# Patient Record
Sex: Male | Born: 1945 | Race: White | Hispanic: No | Marital: Married
Health system: Southern US, Community
[De-identification: ages and names within clinical notes are randomized; demographics above are authoritative.]

## PROBLEM LIST (undated history)

## (undated) DIAGNOSIS — E7800 Pure hypercholesterolemia, unspecified: Secondary | ICD-10-CM

## (undated) DIAGNOSIS — J189 Pneumonia, unspecified organism: Secondary | ICD-10-CM

## (undated) DIAGNOSIS — F419 Anxiety disorder, unspecified: Secondary | ICD-10-CM

## (undated) DIAGNOSIS — M549 Dorsalgia, unspecified: Secondary | ICD-10-CM

## (undated) DIAGNOSIS — E785 Hyperlipidemia, unspecified: Secondary | ICD-10-CM

## (undated) DIAGNOSIS — E039 Hypothyroidism, unspecified: Secondary | ICD-10-CM

## (undated) DIAGNOSIS — E119 Type 2 diabetes mellitus without complications: Secondary | ICD-10-CM

## (undated) DIAGNOSIS — Z86010 Personal history of colonic polyps: Secondary | ICD-10-CM

## (undated) DIAGNOSIS — K5790 Diverticulosis of intestine, part unspecified, without perforation or abscess without bleeding: Secondary | ICD-10-CM

## (undated) DIAGNOSIS — R21 Rash and other nonspecific skin eruption: Secondary | ICD-10-CM

## (undated) DIAGNOSIS — Z8619 Personal history of other infectious and parasitic diseases: Secondary | ICD-10-CM

## (undated) DIAGNOSIS — I10 Essential (primary) hypertension: Secondary | ICD-10-CM

## (undated) DIAGNOSIS — G4700 Insomnia, unspecified: Secondary | ICD-10-CM

## (undated) DIAGNOSIS — C679 Malignant neoplasm of bladder, unspecified: Secondary | ICD-10-CM

## (undated) DIAGNOSIS — G4733 Obstructive sleep apnea (adult) (pediatric): Secondary | ICD-10-CM

## (undated) HISTORY — PX: MULTIPLE TOOTH EXTRACTIONS: SHX2053

## (undated) HISTORY — DX: Hypothyroidism, unspecified: E03.9

## (undated) HISTORY — DX: Essential (primary) hypertension: I10

## (undated) HISTORY — DX: Personal history of colonic polyps: Z86.010

## (undated) HISTORY — PX: CHOLECYSTECTOMY: SHX55

## (undated) HISTORY — PX: BACK SURGERY: SHX140

## (undated) HISTORY — DX: Diverticulosis of intestine, part unspecified, without perforation or abscess without bleeding: K57.90

## (undated) HISTORY — DX: Malignant neoplasm of bladder, unspecified: C67.9

## (undated) HISTORY — PX: FRACTURE SURGERY: SHX138

## (undated) HISTORY — DX: Obstructive sleep apnea (adult) (pediatric): G47.33

## (undated) HISTORY — DX: Insomnia, unspecified: G47.00

## (undated) HISTORY — DX: Anxiety disorder, unspecified: F41.9

## (undated) HISTORY — PX: BLADDER SURGERY: SHX569

## (undated) HISTORY — DX: Dorsalgia, unspecified: M54.9

## (undated) HISTORY — PX: EYE SURGERY: SHX253

## (undated) HISTORY — PX: APPENDECTOMY: SHX54

## (undated) HISTORY — DX: Hyperlipidemia, unspecified: E78.5

## (undated) HISTORY — PX: COLONOSCOPY: SHX174

---
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
## 1974-09-08 HISTORY — PX: TRACHEAL SURGERY: SHX1096

## 2000-05-12 ENCOUNTER — Ambulatory Visit (HOSPITAL_COMMUNITY): Admission: RE | Admit: 2000-05-12 | Discharge: 2000-05-12 | Payer: Self-pay | Admitting: Family Medicine

## 2000-05-12 ENCOUNTER — Encounter: Payer: Self-pay | Admitting: Family Medicine

## 2000-05-23 ENCOUNTER — Encounter: Payer: Self-pay | Admitting: Family Medicine

## 2000-05-23 ENCOUNTER — Ambulatory Visit (HOSPITAL_COMMUNITY): Admission: RE | Admit: 2000-05-23 | Discharge: 2000-05-23 | Payer: Self-pay | Admitting: Family Medicine

## 2000-06-03 ENCOUNTER — Encounter: Payer: Self-pay | Admitting: Neurology

## 2000-06-03 ENCOUNTER — Encounter: Admission: RE | Admit: 2000-06-03 | Discharge: 2000-06-03 | Payer: Self-pay | Admitting: Neurology

## 2000-06-24 ENCOUNTER — Ambulatory Visit (HOSPITAL_COMMUNITY): Admission: RE | Admit: 2000-06-24 | Discharge: 2000-06-24 | Payer: Self-pay | Admitting: Neurosurgery

## 2000-06-24 ENCOUNTER — Encounter: Payer: Self-pay | Admitting: Neurosurgery

## 2000-07-03 ENCOUNTER — Ambulatory Visit (HOSPITAL_BASED_OUTPATIENT_CLINIC_OR_DEPARTMENT_OTHER): Admission: RE | Admit: 2000-07-03 | Discharge: 2000-07-03 | Payer: Self-pay

## 2001-10-25 ENCOUNTER — Encounter: Payer: Self-pay | Admitting: Surgery

## 2001-10-28 ENCOUNTER — Ambulatory Visit (HOSPITAL_COMMUNITY): Admission: RE | Admit: 2001-10-28 | Discharge: 2001-10-28 | Payer: Self-pay | Admitting: Surgery

## 2003-11-17 ENCOUNTER — Encounter: Payer: Self-pay | Admitting: Internal Medicine

## 2004-10-17 ENCOUNTER — Ambulatory Visit (HOSPITAL_COMMUNITY): Admission: RE | Admit: 2004-10-17 | Discharge: 2004-10-17 | Payer: Self-pay | Admitting: Urology

## 2004-10-17 ENCOUNTER — Encounter: Payer: Self-pay | Admitting: Internal Medicine

## 2004-10-28 ENCOUNTER — Ambulatory Visit (HOSPITAL_COMMUNITY): Admission: RE | Admit: 2004-10-28 | Discharge: 2004-10-28 | Payer: Self-pay | Admitting: Family Medicine

## 2004-10-31 ENCOUNTER — Ambulatory Visit: Payer: Self-pay | Admitting: Internal Medicine

## 2004-11-01 ENCOUNTER — Ambulatory Visit: Payer: Self-pay

## 2004-11-01 ENCOUNTER — Encounter: Payer: Self-pay | Admitting: Internal Medicine

## 2005-03-06 ENCOUNTER — Ambulatory Visit (HOSPITAL_COMMUNITY): Admission: RE | Admit: 2005-03-06 | Discharge: 2005-03-06 | Payer: Self-pay | Admitting: Family Medicine

## 2005-09-29 ENCOUNTER — Ambulatory Visit: Payer: Self-pay | Admitting: Internal Medicine

## 2005-09-30 ENCOUNTER — Ambulatory Visit (HOSPITAL_COMMUNITY): Admission: RE | Admit: 2005-09-30 | Discharge: 2005-09-30 | Payer: Self-pay | Admitting: Internal Medicine

## 2005-10-16 ENCOUNTER — Ambulatory Visit (HOSPITAL_COMMUNITY): Admission: RE | Admit: 2005-10-16 | Discharge: 2005-10-17 | Payer: Self-pay | Admitting: Urology

## 2005-10-16 ENCOUNTER — Encounter (INDEPENDENT_AMBULATORY_CARE_PROVIDER_SITE_OTHER): Payer: Self-pay | Admitting: Specialist

## 2005-10-16 ENCOUNTER — Encounter: Payer: Self-pay | Admitting: Cardiothoracic Surgery

## 2005-11-04 ENCOUNTER — Ambulatory Visit (HOSPITAL_BASED_OUTPATIENT_CLINIC_OR_DEPARTMENT_OTHER): Admission: RE | Admit: 2005-11-04 | Discharge: 2005-11-04 | Payer: Self-pay | Admitting: Urology

## 2005-11-10 ENCOUNTER — Encounter: Payer: Self-pay | Admitting: Internal Medicine

## 2005-11-10 ENCOUNTER — Emergency Department (HOSPITAL_COMMUNITY): Admission: EM | Admit: 2005-11-10 | Discharge: 2005-11-10 | Payer: Self-pay | Admitting: Emergency Medicine

## 2005-11-28 ENCOUNTER — Ambulatory Visit: Payer: Self-pay | Admitting: Internal Medicine

## 2006-01-12 ENCOUNTER — Encounter: Admission: RE | Admit: 2006-01-12 | Discharge: 2006-01-15 | Payer: Self-pay | Admitting: Orthopedic Surgery

## 2007-01-26 ENCOUNTER — Ambulatory Visit (HOSPITAL_BASED_OUTPATIENT_CLINIC_OR_DEPARTMENT_OTHER): Admission: RE | Admit: 2007-01-26 | Discharge: 2007-01-26 | Payer: Self-pay | Admitting: Urology

## 2007-01-26 ENCOUNTER — Encounter: Payer: Self-pay | Admitting: Urology

## 2007-07-08 ENCOUNTER — Encounter: Admission: RE | Admit: 2007-07-08 | Discharge: 2007-07-08 | Payer: Self-pay | Admitting: Family Medicine

## 2008-09-08 DIAGNOSIS — C679 Malignant neoplasm of bladder, unspecified: Secondary | ICD-10-CM

## 2008-09-08 HISTORY — DX: Malignant neoplasm of bladder, unspecified: C67.9

## 2008-09-08 HISTORY — PX: OTHER SURGICAL HISTORY: SHX169

## 2009-06-06 ENCOUNTER — Ambulatory Visit (HOSPITAL_COMMUNITY): Admission: RE | Admit: 2009-06-06 | Discharge: 2009-06-06 | Payer: Self-pay | Admitting: Family Medicine

## 2009-07-06 ENCOUNTER — Encounter: Payer: Self-pay | Admitting: Internal Medicine

## 2009-10-10 ENCOUNTER — Encounter (INDEPENDENT_AMBULATORY_CARE_PROVIDER_SITE_OTHER): Payer: Self-pay | Admitting: *Deleted

## 2009-10-11 ENCOUNTER — Ambulatory Visit: Payer: Self-pay | Admitting: Internal Medicine

## 2009-10-13 LAB — HM COLONOSCOPY

## 2009-10-25 ENCOUNTER — Ambulatory Visit: Payer: Self-pay | Admitting: Internal Medicine

## 2009-10-27 ENCOUNTER — Encounter: Payer: Self-pay | Admitting: Internal Medicine

## 2009-10-29 ENCOUNTER — Telehealth: Payer: Self-pay | Admitting: Internal Medicine

## 2009-11-06 ENCOUNTER — Ambulatory Visit: Payer: Self-pay | Admitting: Internal Medicine

## 2009-11-06 DIAGNOSIS — E669 Obesity, unspecified: Secondary | ICD-10-CM | POA: Insufficient documentation

## 2009-11-06 DIAGNOSIS — R109 Unspecified abdominal pain: Secondary | ICD-10-CM | POA: Insufficient documentation

## 2009-11-10 ENCOUNTER — Encounter: Payer: Self-pay | Admitting: Internal Medicine

## 2009-11-10 DIAGNOSIS — IMO0002 Reserved for concepts with insufficient information to code with codable children: Secondary | ICD-10-CM | POA: Insufficient documentation

## 2009-11-10 DIAGNOSIS — K5730 Diverticulosis of large intestine without perforation or abscess without bleeding: Secondary | ICD-10-CM | POA: Insufficient documentation

## 2009-11-10 DIAGNOSIS — Z860100 Personal history of colon polyps, unspecified: Secondary | ICD-10-CM | POA: Insufficient documentation

## 2009-11-10 HISTORY — DX: Personal history of colonic polyps: Z86.010

## 2009-11-12 ENCOUNTER — Telehealth: Payer: Self-pay | Admitting: Internal Medicine

## 2009-12-27 ENCOUNTER — Ambulatory Visit (HOSPITAL_COMMUNITY): Admission: RE | Admit: 2009-12-27 | Discharge: 2009-12-29 | Payer: Self-pay | Admitting: Specialist

## 2010-09-29 ENCOUNTER — Encounter: Payer: Self-pay | Admitting: Internal Medicine

## 2010-09-29 ENCOUNTER — Encounter: Payer: Self-pay | Admitting: Family Medicine

## 2010-10-08 NOTE — Assessment & Plan Note (Signed)
Summary: Persistent LLQ pain  Dan [name] MR#:  [id number] Page #  NAME:  Dan, [name]  OFFICE NO:  [id number]  DATE:  11/28/05  DOB:  [birth year]  CHIEF COMPLAINT:  Persistent, left-sided, lower quadrant pain.    HISTORY OF PRESENT ILLNESS:  The patient had a bladder cancer discovered at a CT scan I ordered.  He went to Dr. Annabell Howells, and that has been removed.  The ureter was involved as well, at least it was thought, perhaps the kidney.  He tells me that he has been back to Dr. Annabell Howells, and it was thought that the tumor was cleared at this point, though some followup will be needed.  Having the tumor removed did not help his pain.  He continues with a pain that is worse in the left groin area and left lower quadrant when he is sitting.  When he was off of work for a while, it did not seem to bother him.  He drives in a car at work, and sitting seems to bother it.  Standing will bother it as well.  There is some back pain, but the predominant feature is left lower quadrant and groin pain as described in my note of September 29, 2005.  He is not having any urinary problems.  He says that Ambien relieves it at night.  Vicodin is not really helping.  He is not having any lower extremity weakness.  There is no fecal or urinary incontinence.    He does describe some numbness and tingling in the left lateral aspect of the knee at times.    MEDICATIONS:  Listed and reviewed in the chart.    PHYSICAL EXAMINATION:  Weight 217, blood pressure 122/74, pulse 72.  Abdomen:  There is some mild left lower quadrant tenderness.  It seems to be worse with muscle tension.  There is some left flank pain.  He has some pain with right straight leg raise in that he has left flank pain when the right lower extremity is raised.  Deep tendon reflexes are normal at the knees.  There is no clonus of the ankles.  Lower extremity strength is normal.    ASSESSMENT:  Left lower quadrant pain, etiology not really clear to me.  He previously  had a colonoscopy 2 years ago with some polyps.  We have not yet received those records.  We will try that again, also asking his primary care provider for the records as well.  Perhaps they have them.  We had requested them from Dr. Tania Ade in [place].  I doubt that this is a colonic process.  There is no relationship to bowel habits or anything like that.  He has not had any bleeding.  It has more of a musculoskeletal or perhaps radicular flavor, I think.    PLAN:   1.  Naprosyn 500 mg twice daily on a regular basis for about a month or so.   2.  Percocet 5/325 to help with pain at night if needed, #30, no refills.   3.  He was warned about the side effects of ulcer disease and dyspepsia with Naprosyn and is to call if that is a problem.   4.  I will see him back in 4-6 weeks.  He may need to go back to primary care to get an orthopedic referral versus an MRI of the spine or something like that.  Further plans pending response or lack thereof to the above.  Iva Boop, M.D., F.A.C.G.  [id number] cc:  Montey Hora, M.D.         Bjorn Pippin, M.D.  D:  11/28/05; T:  ; Job [phone]

## 2010-10-08 NOTE — Procedures (Signed)
Summary: Colonoscopy  Patient: Dan [name] Note: All result statuses are Final unless otherwise noted.  Tests: (1) Colonoscopy (COL)   COL Colonoscopy           DONE     [facility]     [address]     [place], [place]  [address]           COLONOSCOPY PROCEDURE REPORT           PATIENT:  Dan, [name]  MR#:  [id number]     BIRTHDATE:  February 23, 1946, 63 yrs. old  GENDER:  male           ENDOSCOPIST:  Iva Boop, MD, [facility]     Referred by:  Kyra Manges, M.D.           PROCEDURE DATE:  10/25/2009     PROCEDURE:  Colonoscopy with biopsy and snare polypectomy     ASA CLASS:  Class II     INDICATIONS:  diverticulitis recurrent LLQ pain thought tobe     diverticulitis     CT w/o IV contrast abd/pelvis 9/10 shows diverticulosis but not     divertculitis           MEDICATIONS:   Fentanyl 50 mcg IV, Versed 7 mg IV           DESCRIPTION OF PROCEDURE:   After the risks benefits and     alternatives of the procedure were thoroughly explained, informed     consent was obtained.  Digital rectal exam was performed and     revealed no abnormalities.  Smooth prostate without nodules. The     LB CF-[id number] endoscope was introduced through the anus and     advanced to the cecum, which was identified by both the appendix     and ileocecal valve, without limitations.  The quality of the prep     was excellent, using MoviPrep.  The instrument was then slowly     withdrawn as the colon was fully examined.     Insertion: 3:20 minutes Withdrawal: 14:52 minutes     <<PROCEDUREIMAGES>>           FINDINGS:  Severe diverticulosis was found in the left colon.  Two     polyps were found. 3 mm ascending colon polyp and 5 mm rectal     polyp. The 3mm ascending polyp was removed using cold biopsy     forceps. 5 mm rectal polyp was snared without cautery. Retrieval     was successful.   This was otherwise a normal examination of the     colon.   Retroflexed views in the rectum revealed  no     abnormalities.    The scope was then withdrawn from the patient     and the procedure completed.           COMPLICATIONS:  None           ENDOSCOPIC IMPRESSION:     1) Severe diverticulosis in the left colon     2) Two polyps removed (3 mm ascending and 5 mm rectal)     3) Otherwise normal examination     RECOMMENDATIONS:     Return to primary care physician     High fiber diet and would try anti-spasmodic agents.     I can see him in the office re: recurrent LLQ pain if desired           REPEAT EXAM:  In for Colonoscopy, pending biopsy results.           Iva Boop, MD, [facility]           CC:  Kyra Manges, MD     The Patient           n.     Rosalie Doctor:   Iva Boop at 10/25/2009 09:52 AM           Lucretia Kern, [id number]  Note: An exclamation mark (!) indicates a result that was not dispersed into the flowsheet. Document Creation Date: 10/25/2009 9:52 AM _______________________________________________________________________  (1) Order result status: Final Collection or observation date-time: 10/25/2009 09:38 Requested date-time:  Receipt date-time:  Reported date-time:  Referring Physician:   Ordering Physician: Stan Head [phone]) Specimen Source:  Source: Launa Grill Order Number: [id number] Lab site:   Appended Document: Colonoscopy     Procedures Next Due Date:    Colonoscopy: 11/2012

## 2010-10-08 NOTE — Procedures (Signed)
Summary: Colonoscopy/[facility]  Colonoscopy/[facility]   Imported By: Sherian Rein 11/12/2009 11:22:11  _____________________________________________________________________  External Attachment:    Type:   Image     Comment:   External Document

## 2010-10-08 NOTE — Miscellaneous (Signed)
Summary: Moviprep rx  Clinical Lists Changes  Medications: Added new medication of MOVIPREP 100 GM  SOLR (PEG-KCL-NACL-NASULF-NA ASC-C) As per prep instructions. - Signed Rx of MOVIPREP 100 GM  SOLR (PEG-KCL-NACL-NASULF-NA ASC-C) As per prep instructions.;  #1 x 0;  Signed;  Entered by: Jennye Boroughs RN;  Authorized by: Iva Boop MD, [facility];  Method used: [facility] [phone]*, [address], [place], [place], [place]  [address], Ph: [phone] or [phone], Fax: [phone]    Prescriptions: MOVIPREP 100 GM  SOLR (PEG-KCL-NACL-NASULF-NA ASC-C) As per prep instructions.  #1 x 0   Entered by:   Jennye Boroughs RN   Authorized by:   Iva Boop MD, [facility]   Signed by:   Jennye Boroughs RN on 10/11/2009   Method used:   Electronically to        [facility] [phone]* (retail)       [address]       [place], [place]  [address]       Ph: [phone] or [phone]       Fax: [phone]   RxID:   [id number]

## 2010-10-08 NOTE — Letter (Signed)
Summary: Patient Notice- Polyp Results  [facility]  [address] [place], [place] [address]   Phone: [phone]  Fax: [phone]        October 27, 2009 MRN: [id number]    [facility] [address] [place], [place]  [address]    Dear Dan [name],  POne of the polyps removed from your colon was adenomatous. This means that it was pre-cancerous or that  it had the potential to change into cancer over time. the other polyp was not pre-cancerous.  I recommend that you have a repeat colonoscopy in 3 years to determine if you have developed any new polyps over time. If you develop any new rectal bleeding, abdominal pain or significant bowel habit changes, please contact us before then.  In addition to repeating colonoscopy, changing health habits may reduce your risk of having more colon polyps and possibly, colon cancer. You may lower your risk of future polyps and colon cancer by adopting healthy habits such as not smoking or using tobacco (if you do), being physically active, losing weight (if overweight), and eating a diet which includes fruits and vegetables and limits red meat.  Please call us if you are having persistent problems or have questions about your condition that have not been fully answered at this time.   Sincerely,  Iva Boop MD, [facility]  This letter has been electronically signed by your physician.  Appended Document: Patient Notice- Polyp Results Letter mailed 2.22.11.

## 2010-10-08 NOTE — Consult Note (Signed)
Summary[facility]   Imported By: Sherian Rein 11/12/2009 11:29:39  _____________________________________________________________________  External Attachment:    Type:   Image     Comment:   External Document

## 2010-10-08 NOTE — Assessment & Plan Note (Signed)
Summary: llq absominal pain.Dan [name]   History of Present Illness Visit Type: Initial Visit Primary GI MD: Stan Head MD [facility] Primary Provider: Ignacia Bayley  Chief Complaint: LLQ abdominal Pain History of Present Illness:   65 yo white man with abdominal pain. Recently had an open access colonoscopy and described the LLQ pain and I recommended office visit. He had actually seen me for this in 2006-7, and a CT discovered transitional cell carcinoma of the bladder, which was resected,. the pain continued. He describes many years of LLQ pain that is worsening feels like a knot when sitting and lying also has sxs in right groin standing helps it some low back pain and stinging in right hip area some rare intermittent lower extremity pain no buttock pain   GI Review of Systems    Reports abdominal pain.     Location of  Abdominal pain: LLQ.    Denies acid reflux, belching, bloating, chest pain, dysphagia with liquids, dysphagia with solids, heartburn, loss of appetite, nausea, vomiting, vomiting blood, weight loss, and  weight gain.      Reports change in bowel habits and  diarrhea.     Denies anal fissure, black tarry stools, constipation, diverticulosis, fecal incontinence, heme positive stool, hemorrhoids, irritable bowel syndrome, jaundice, light color stool, liver problems, rectal bleeding, and  rectal pain. Preventive Screening-Counseling & Management  Alcohol-Tobacco     Smoking Status: quit      Drug Use:  no.      Current Medications (verified): 1)  Lipitor 20 Mg Tabs (Atorvastatin Calcium) .Marland Kitchen.. 1 Tablet By Mouth Once Daily 2)  Amlodipine Besylate 10 Mg Tabs (Amlodipine Besylate) .Marland Kitchen.. 1 Tablet By Mouth Once Daily 3)  Uroxatral 10 Mg Xr24h-Tab (Alfuzosin Hcl) .Marland Kitchen.. 1 Tablet By Mouth Once Daily 4)  Levothroid 200 Mcg Tabs (Levothyroxine Sodium) .Marland Kitchen.. 1 Tablet By Mouth Once Daily 5)  Diovan 160 Mg Tabs (Valsartan) .Marland Kitchen.. 1 Tablet By Mouth Once Daily 6)  Ambien 10 Mg Tabs  (Zolpidem Tartrate) .Marland Kitchen.. 1 By Mouth At Bedtime Sleep  Allergies (verified): 1)  ! * Ivp Dye  Past History:  Past Medical History: Anxiety Disorder Arthritis Adenomatous Colon Polyps Diverticulosis Hyperlipidemia Hypertension Hypothyroidism Irritable Bowel Syndrome Kidney Stones Bladder Cancer - Wrenn BPH  Past Surgical History: Appendectomy Cholecystectomy Hernia Surgery Facial Surgery  Family History: Brain cancer-uncle Lung Cancer-uncle Bladder cancer-uncle No FH of Colon Cancer: Family History of Diabetes: father  Social History: Married 2 girls Retired Patient is a former smoker.  Alcohol Use - yes Daily Caffeine Use Illicit Drug Use - no Smoking Status:  quit Drug Use:  no  Review of Systems       The patient complains of allergy/sinus, anxiety-new, back pain, headaches-new, shortness of breath, sleeping problems, and urination - excessive.         abd sxs keep him from sleeping All other ROS negative except as per HPI.   Vital Signs:  Patient profile:   65 year old male Height:      71 inches Weight:      242 pounds BMI:     33.87 Pulse rate:   96 / minute Pulse rhythm:   regular BP sitting:   120 / 70  (left arm)  Vitals Entered By: Milford Cage NCMA (November 06, 2009 2:49 PM)  Physical Exam  General:  obese, NAD Mouth:  No deformity or lesions, dentition normal. Lungs:  Clear throughout to auscultation. Heart:  Regular rate and rhythm; no murmurs, rubs,  or bruits. Abdomen:  obese, soft tender/sore LLQ area and some RLQ Extremities:  no edema Neurologic:  lower ext strength intact, able to squat and rise   Impression & Recommendations:  Problem # 1:  ABDOMINAL PAIN, LOWER (ICD-789.09) Chronic LLQ and groin pain Has had years of other somatic complaints also Etiology not clear but do not think it is gastrointestinal in origin ? radicular process fom LS spine. He says he has not had a spine evaluation and I think bladder cancer  issues took over after I saw him in 2007 Records review shows 2006 and 2007 L and Cspine MRs with degenerative disc disease but were not oredered by spine specialists Will rx Medrol dose pak and seek spine evaluation specialist  Problem # 2:  OBESITY (ICD-278.00) part of problem, weight loss discussed  Patient Instructions: 1)  You need to lose weight. Start by limiting portions, amounts. Avoid eating when not hungry. Limit desserts.Look for high fructose corn syrup on food labels and if in first 3 ingredients, avoid that food. Also try to eat whole grains, avoid "white foods" (e.g. white rice, white bread).   2)  Please pick up your medications at your pharmacy.Medrol dosepack and vicodin 3)  We will call with spine specialist appointment. 4)  The medication list was reviewed and reconciled.  All changed / newly prescribed medications were explained.  A complete medication list was provided to the patient / caregiver. Prescriptions: HYDROCODONE-ACETAMINOPHEN 5-500 MG TABS (HYDROCODONE-ACETAMINOPHEN) 1-2 every 4 hours as needed for severe pain  #20 x 0   Entered and Authorized by:   Iva Boop MD, [facility]   Signed by:   Iva Boop MD, [facility] on 11/06/2009   Method used:   Printed then [facility] ...       [facility]  [address] [phone]* (retail)       [address]       [place], [place]  [address]       Ph: [phone] or [phone]       Fax: [phone]   RxID:   [id number] MEDROL (PAK) 4 MG TABS (METHYLPREDNISOLONE) use as directed  #1 x 0   Entered by:   Francee Piccolo CMA (AAMA)   Authorized by:   Iva Boop MD, [facility]   Signed by:   Francee Piccolo CMA (AAMA) on 11/06/2009   Method used:   Electronically to        [facility] [phone]* (retail)       [address]       [place], [place]  [address]       Ph: [phone] or [phone]       Fax: [phone]   RxID:   [id number]  cc: Kyra Manges, MD  and Bjorn Pippin, MD  Appended Document: llq absominal pain.Dan [name]   Impression & Recommendations:  Problem # 1:  DEGENERATIVE DISC DISEASE (ICD-722.6) Assessment Comment Only He clearly has this on Cspine and LS Spine MR's will see if Medrol helps and get a spine eval   Appended Document: Orders Update Clinical Lists Changes  Orders: Added new Referral order of Neurology Referral (Neuro) - Signed      Appended Document: Orders Update Clinical Lists Changes  Orders: Added new Referral order of Orthopedic Referral (Ortho) - Signed

## 2010-10-08 NOTE — Progress Notes (Signed)
Summary: [facility]   Imported By: Sherian Rein 11/12/2009 11:28:16  _____________________________________________________________________  External Attachment:    Type:   Image     Comment:   External Document

## 2010-10-08 NOTE — Letter (Signed)
Summary: [facility] Instructions  [facility]  [address] [place], [place] [address]   Phone: [phone]  Fax: [phone]       Dan [name]    Aug 13, 1946    MRN: [id number]        Procedure Day Dan [name]:  Dan [name]  10/25/09     Arrival Time:  8:00AM     Procedure Time:  9:00AM     Location of Procedure:                    _ X_  [facility] (4th Floor)                        PREPARATION FOR COLONOSCOPY WITH MOVIPREP   Starting 5 days prior to your procedure 10/20/09 do not eat nuts, seeds, popcorn, corn, beans, peas,  salads, or any raw vegetables.  Do not take any fiber supplements (e.g. Metamucil, Citrucel, and Benefiber).  THE DAY BEFORE YOUR PROCEDURE         DATE: 10/24/09  DAY: WEDNESDAY  1.  Drink clear liquids the entire day-NO SOLID FOOD  2.  Do not drink anything colored red or purple.  Avoid juices with pulp.  No orange juice.  3.  Drink at least 64 oz. (8 glasses) of fluid/clear liquids during the day to prevent dehydration and help the prep work efficiently.  CLEAR LIQUIDS INCLUDE: Water Jello Ice Popsicles Tea (sugar ok, no milk/cream) Powdered fruit flavored drinks Coffee (sugar ok, no milk/cream) Gatorade Juice: apple, white grape, white cranberry  Lemonade Clear bullion, consomm, broth Carbonated beverages (any kind) Strained chicken noodle soup Hard Candy                             4.  In the morning, mix first dose of MoviPrep solution:    Empty 1 Pouch A and 1 Pouch B into the disposable container    Add lukewarm drinking water to the top line of the container. Mix to dissolve    Refrigerate (mixed solution should be used within 24 hrs)  5.  Begin drinking the prep at 5:00 p.m. The MoviPrep container is divided by 4 marks.   Every 15 minutes drink the solution down to the next mark (approximately 8 oz) until the full liter is complete.   6.  Follow completed prep with 16 oz of clear liquid of your choice  (Nothing red or purple).  Continue to drink clear liquids until bedtime.  7.  Before going to bed, mix second dose of MoviPrep solution:    Empty 1 Pouch A and 1 Pouch B into the disposable container    Add lukewarm drinking water to the top line of the container. Mix to dissolve    Refrigerate  THE DAY OF YOUR PROCEDURE      DATE: 10/25/09 DAY: THURSDAY  Beginning at 4:00AM (5 hours before procedure):         1. Every 15 minutes, drink the solution down to the next mark (approx 8 oz) until the full liter is complete.  2. Follow completed prep with 16 oz. of clear liquid of your choice.    3. You may drink clear liquids until 7:AM (2 HOURS BEFORE PROCEDURE).   MEDICATION INSTRUCTIONS  Unless otherwise instructed, you should take regular prescription medications with a small sip of water   as early as possible the morning of  your procedure.   Additional medication instructions: _         OTHER INSTRUCTIONS  You will need a responsible adult at least 63 years of age to accompany you and drive you home.   This person must remain in the waiting room during your procedure.  Wear loose fitting clothing that is easily removed.  Leave jewelry and other valuables at home.  However, you may wish to bring a book to read or  an iPod/MP3 player to listen to music as you wait for your procedure to start.  Remove all body piercing jewelry and leave at home.  Total time from sign-in until discharge is approximately 2-3 hours.  You should go home directly after your procedure and rest.  You can resume normal activities the  day after your procedure.   The day of your procedure you should not:   Drive   Make legal decisions   Operate machinery   Drink alcohol   Return to work  You will receive specific instructions about eating, activities and medications before you leave.    The above instructions have been reviewed and explained to me by   Dan Boroughs RN   October 11, 2009 2:42 PM     I fully understand and can verbalize these instructions _____________________________ Date _________

## 2010-10-08 NOTE — Progress Notes (Signed)
Summary: Triage  Phone Note Call from Patient Call back at Home Phone [phone]   Caller: Patient Call For: Dr. Leone Payor Summary of Call: Pt is having pain rectal pain. Pt. said he could not wait until the next avail. which is 12-03-09 Initial call taken by: Karna Christmas,  October 29, 2009 9:39 AM  Follow-up for Phone Call        patient with a several year hx of llq abdominal pain .  He would like to be seen earlier than 12/03/09. Patient  is rescheduled to 11/06/09 2:30 Follow-up by: Darcey Nora RN, CGRN,  October 29, 2009 10:03 AM

## 2010-10-08 NOTE — Letter (Signed)
Summary: *Referral Letter  [facility]  [address] [place], [place] [address]   Phone: [phone]  Fax: [phone]    11/10/2009 Vira Browns, MD  Thank you in advance for agreeing to see my patient:  Dan [name] [address] [place], [place]  [address]  Phone: [phone]  Reason for Referral: evaluate multilevel degenerative disc disease and chronic LLQ pain and groin pain that could be related     Current Medications: 1)  LIPITOR 20 MG TABS (ATORVASTATIN CALCIUM) 1 tablet by mouth once daily 2)  AMLODIPINE BESYLATE 10 MG TABS (AMLODIPINE BESYLATE) 1 tablet by mouth once daily 3)  UROXATRAL 10 MG XR24H-TAB (ALFUZOSIN HCL) 1 tablet by mouth once daily 4)  LEVOTHROID 200 MCG TABS (LEVOTHYROXINE SODIUM) 1 tablet by mouth once daily 5)  DIOVAN 160 MG TABS (VALSARTAN) 1 tablet by mouth once daily 6)  AMBIEN 10 MG TABS (ZOLPIDEM TARTRATE) 1 by mouth at bedtime sleep 7)  MEDROL (PAK) 4 MG TABS (METHYLPREDNISOLONE) use as directed 8)  HYDROCODONE-ACETAMINOPHEN 5-500 MG TABS (HYDROCODONE-ACETAMINOPHEN) 1-2 every 4 hours as needed for severe pain   Past Medical History: 1)  Anxiety Disorder 2)  Arthritis 3)  Adenomatous Colon Polyps 4)  Diverticulosis 5)  Hyperlipidemia 6)  Hypertension 7)  Hypothyroidism 8)  Irritable Bowel Syndrome 9)  Kidney Stones 10)  Bladder Cancer - Wrenn 11)  BPH 12) Degenerative disc disease on Cspine and Lspine MR    Thank you again for agreeing to see our patient; please contact us if you have any further questions or need additional information.  Sincerely,  Ashley Murrain MD, [facility]

## 2010-10-08 NOTE — Op Note (Signed)
[facility]   Imported By: Sherian Rein 11/12/2009 11:24:14  _____________________________________________________________________  External Attachment:    Type:   Image     Comment:   External Document

## 2010-10-08 NOTE — Progress Notes (Signed)
Summary: Refill Hydrocodone  Phone Note From Pharmacy   [facility]: [facility] [phone]* Summary of Call: Faxed request for refill on Vicodin 5-500.  Pt was given 20 on 11/06/09.   Pt is scheduled to see Dr. Otelia Sergeant on 11/21/09 @ 3:00pm.  Is it OK to refill this medication? Pt is also requesting refill on the Medrol dose pack-received electronically.  He states that at the higher doses he had less pain and used less hydrocodone.  As he decreased steroid dose, he needed to use more hydrocodone.   Pt was given details for Dr. Otelia Sergeant appt. Initial call taken by: Francee Piccolo CMA Duncan Dull),  November 12, 2009 11:57 AM  Follow-up for Phone Call        ok refill hydrocodone as written last time and the medrol dosepak further rx thru Dr. Otelia Sergeant after he sees him Follow-up by: Iva Boop MD, Clementeen Graham,  November 12, 2009 1:53 PM  Additional Follow-up for Phone Call Additional follow up Details #1::        pt notified both med refilled and must last until he sees Dr. Otelia Sergeant on 3/16.  Pt voices understanding. Additional Follow-up by: Francee Piccolo CMA Duncan Dull),  November 12, 2009 3:05 PM    Prescriptions: HYDROCODONE-ACETAMINOPHEN 5-500 MG TABS (HYDROCODONE-ACETAMINOPHEN) 1-2 every 4 hours as needed for severe pain  #20 x 0   Entered by:   Francee Piccolo CMA (AAMA)   Authorized by:   Iva Boop MD, [facility]   Signed by:   Francee Piccolo CMA (AAMA) on 11/12/2009   Method used:   Printed then faxed to ...       [facility]  [address] [phone]* (retail)       [address]       [place], [place]  [address]       Ph: [phone] or [phone]       Fax: [phone]   RxID:   [id number]

## 2010-10-18 LAB — HM DIABETES EYE EXAM

## 2010-11-26 LAB — COMPREHENSIVE METABOLIC PANEL
AST: 84 U/L — ABNORMAL HIGH (ref 0–37)
BUN: 11 mg/dL (ref 6–23)
Chloride: 105 mEq/L (ref 96–112)
GFR calc Af Amer: >60 mL/min (ref >60)
Glucose, Bld: 118 mg/dL — ABNORMAL HIGH (ref 70–99)
Potassium: 4.1 mEq/L (ref 3.5–5.1)
Total Bilirubin: 0.6 mg/dL (ref 0.3–1.2)
Total Protein: 6.9 g/dL (ref 6.0–8.3)

## 2010-11-26 LAB — DIFFERENTIAL
Basophils Relative: 0 % (ref 0–1)
Eosinophils Absolute: 0.2 10*3/uL (ref 0.0–0.7)
Lymphs Abs: 2 10*3/uL (ref 0.7–4.0)
Neutro Abs: 7.3 10*3/uL (ref 1.7–7.7)
Neutrophils Relative %: 72 % (ref 43–77)

## 2010-11-26 LAB — URINALYSIS, ROUTINE W REFLEX MICROSCOPIC
Bilirubin Urine: NEGATIVE
Hgb urine dipstick: NEGATIVE
Specific Gravity, Urine: 1.016 (ref 1.005–1.030)
Urobilinogen, UA: 0.2 mg/dL (ref 0.0–1.0)
pH: 5.5 (ref 5.0–8.0)

## 2010-11-26 LAB — CBC
Hemoglobin: 15.8 g/dL (ref 13.0–17.0)
MCHC: 35.2 g/dL (ref 30.0–36.0)
RBC: 4.83 MIL/uL (ref 4.22–5.81)
WBC: 10.2 10*3/uL (ref 4.0–10.5)

## 2010-12-11 LAB — HM DIABETES FOOT EXAM

## 2010-12-12 ENCOUNTER — Encounter: Payer: Self-pay | Admitting: Physician Assistant

## 2010-12-12 DIAGNOSIS — E785 Hyperlipidemia, unspecified: Secondary | ICD-10-CM | POA: Insufficient documentation

## 2010-12-12 DIAGNOSIS — L219 Seborrheic dermatitis, unspecified: Secondary | ICD-10-CM

## 2010-12-12 DIAGNOSIS — E039 Hypothyroidism, unspecified: Secondary | ICD-10-CM | POA: Insufficient documentation

## 2010-12-12 DIAGNOSIS — N529 Male erectile dysfunction, unspecified: Secondary | ICD-10-CM

## 2010-12-12 DIAGNOSIS — K219 Gastro-esophageal reflux disease without esophagitis: Secondary | ICD-10-CM

## 2010-12-12 DIAGNOSIS — F418 Other specified anxiety disorders: Secondary | ICD-10-CM

## 2010-12-12 DIAGNOSIS — A630 Anogenital (venereal) warts: Secondary | ICD-10-CM

## 2010-12-12 DIAGNOSIS — E1140 Type 2 diabetes mellitus with diabetic neuropathy, unspecified: Secondary | ICD-10-CM | POA: Insufficient documentation

## 2010-12-12 DIAGNOSIS — G4700 Insomnia, unspecified: Secondary | ICD-10-CM

## 2010-12-12 DIAGNOSIS — E119 Type 2 diabetes mellitus without complications: Secondary | ICD-10-CM

## 2010-12-12 DIAGNOSIS — N400 Enlarged prostate without lower urinary tract symptoms: Secondary | ICD-10-CM

## 2011-01-21 NOTE — Op Note (Signed)
Dan [name], LAMA                 ACCOUNT NO.:  [id number]   MEDICAL RECORD NO.:  [id number]          PATIENT TYPE:  AMB   LOCATION:  [facility]                         FACILITY:  [facility]   PHYSICIAN:  Excell Seltzer. Annabell Howells, M.D.    DATE OF BIRTH:  1946-05-14   DATE OF PROCEDURE:  01/26/2007  DATE OF DISCHARGE:                               OPERATIVE REPORT   PROCEDURE:  1. Cystoscopy with bilateral retrograde pyelograms and interpretation.  2. Transurethral resection of bladder tumor of a 1-cm posterior wall      bladder tumor.  3. Installation of mitomycin C.   PREOPERATIVE DIAGNOSIS:  Recurrent transitional cell carcinoma of the  bladder.   POSTOPERATIVE DIAGNOSIS:  Recurrent transitional cell carcinoma of the  bladder.   SURGEON:  Dr. Bjorn Pippin.   ANESTHESIA:  General.   SPECIMEN:  Bladder tumor.   COMPLICATIONS:  None.   INDICATIONS:  Mr. Collingsworth is a 65 year old white male with a history of  transitional cell carcinoma of the bladder who was recently found to  have a posterior wall recurrence on surveillance cystoscopy.  He has not  an upper tract evaluation recently, so will undergo retrograde  pyelograms, TURBT and installation of mitomycin C.   FINDINGS OF PROCEDURE:  The patient was given Cipro.  He was taken  operating room where general anesthetic was induced.  He was placed in  lithotomy position.  His perineum and genitalia were prepped in usual  sterile fashion with Betadine.  He was then draped in the usual sterile  fashion.  Cystoscopy was performed using a 22-French scope and 12- and  70-degree lenses.  Examination revealed a normal urethra.  The external  sphincter was intact.  Prostatic urethra had trilobar hyperplasia with  mild obstruction.  Examination of the bladder revealed an approximately  1-cm tumor on the posterior wall.  This was medial to scar from a prior  tumor resection.  Though the right ureteral orifice was unremarkable,  left ureteral orifice was  somewhat wide mouthed from a prior resection.  No other abnormalities were noted.   A 5-French open-end catheter was used to instill contrast in a  retrograde fashion of both ureteral orifices.   The right retrograde pyelogram revealed no hydronephrosis or filling  defects.   The left retrograde pyelogram revealed no hydronephrosis or filling  defects.   After completion of the retrograde pyelograms, the urethra was  calibrated to 32-French with Sissy Hoff sounds, and a 28-French  continuous flow resectoscope sheath was inserted without difficulty.  This was fitted with an [place] handle, a bladder wall loop and 12-  degree lens.  The Bovie was set on Pure Cut, and the tumor was removed  without difficulty.  The area around the base of the tumor was  fulgurated, and the specimen was removed.   At this point, a 20-French Foley catheter was inserted.  The bladder was  drained.  He then underwent installation of 30 mg of mitomycin C in 30  mL of diluent.  The catheter was plugged.  This will be left indwelling  for  30 minutes and then placed to drainage.  He was taken down from  lithotomy position.  His anesthetic was reversed.  He was moved to the  recovery room in stable condition.  There were no complications.      Excell Seltzer. Annabell Howells, M.D.  Electronically Signed     JJW/MEDQ  D:  01/26/2007  T:  01/26/2007  Job:  [id number]   cc:   Ernestina Penna, M.D.  Fax: [phone]

## 2011-01-24 NOTE — Op Note (Signed)
Dan [name], [name]                 ACCOUNT NO.:  [id number]   MEDICAL RECORD NO.:  [id number]          PATIENT TYPE:  OIB   LOCATION:  0098                         FACILITY:  [facility]   PHYSICIAN:  Excell Seltzer. Annabell Howells, M.D.    DATE OF BIRTH:  1946-06-19   DATE OF PROCEDURE:  10/16/2005  DATE OF DISCHARGE:                                 OPERATIVE REPORT   PROCEDURE:  Cystoscopy with transurethral resection of bladder tumors 2-5 cm  in size, insertion of left double-J stent.   PREOPERATIVE DIAGNOSIS:  Multifocal bladder tumors.   POSTOPERATIVE DIAGNOSIS:  Multifocal bladder tumors with involvement of left  distal ureter.   SURGEON:  Dr. Bjorn Pippin   ANESTHESIA:  General.   SPECIMEN:  1.  Tumor from left trigone.  2.  Tumor from left lateral wall.  3.  Base of tumor from left lateral wall.   BLOOD LOSS:  Minimal.   DRAIN:  6 French x 26 cm double-J stent.   COMPLICATIONS:  None.   INDICATIONS:  Dan [name] is a 65 year old white male with an incidentally  found enhancing mass on the left lateral bladder wall on a CT done for  abdominal discomfort.  Cystoscopy in the office revealed tumors of the left  lateral wall and also the left trigone with which obscured the left ureteral  orifice; it was felt that TURBT, possible mitomycin C instillation, possible  stent insertion was indicated.   FINDINGS AT PROCEDURE:  The patient was taken to the operating room after  receiving p.o. Cipro.  A general anesthetic was induced.  He was placed in  lithotomy position.  His perineum and genitalia were prepped without  Betadine solution.  He was draped in the usual sterile fashion.  Cystoscopy  was performed using the 22-French scope and 12 and 70-degree lenses.  Examination revealed a normal urethra.  The prostatic urethra is elongated  with trilobar hyperplasia with some obstruction.  Examination of bladder  revealed mild trabeculation.  The right ureteral orifice was unremarkable.  There was  about a 1.5 cm tumor over the left ureteral orifice.  The orifice  could not be seen.  On the left posterior lateral wall, there was about a 2  cm tumor which appeared papillary and almost off, but superior and lateral  to this tumor was spreading papillary fronds that extended the area of the  tumor to approximately 4 cm in diameter.   After completion of cystoscopy, the urethra was dilated to 32-French with  Sissy Hoff sounds, and a 28-French continuous flow resectoscope sheath was  inserted.  This was fitted with an [place] handle, 12-degree lens and a 26  loop.   The left trigone tumor was initially resected on pure cut.  It was confirmed  that the tumor was directly over the orifice, and the meatus was resected  during the removal of the tumor.  Once the tumor had been completely  resected on pure cut, the surrounding mucosa was fulgurated with the  cautery; no bleeding was noted.  This specimen was then retrieved.   I then  resected the left lateral wall tumor carefully on pure cut, as there  was an obturator reflex noted during the deeper portion of the resection.  However, I was quite careful to avoid significant muscular spasms and was  able to completely resect the bulk of the tumor.  I then shaved off the more  flap tumor completing the resection.  This specimen was then evacuated.   I then resected an additional chip of muscular tissue from the base the  tumor where I felt the stalk had originated; this was sent as a separate  specimen.   At this point, the bed of the tumor and surrounding mucosa was fulgurated  with the TUR loop.   At this point, I reinspected the left ureteral orifice.  Unfortunately, it  appeared that tumor fronds extended up into the orifice, and I did not feel  comfortable resecting additional ureter at this point, as I was afraid it  would detach from the bladder.   I reinserted the cystoscope and passed the guidewire up the ureteral orifice   without difficulty.  A 6-French 26 cm double-J stent was inserted over the  wire.  The wire was removed leaving a good coil in the bladder.  Urine was  noted to efflux from the stent lumen.  Fluoroscopy was then used to confirm  the position of the stent proximally.   At this point, the cystoscope was removed, and a 22-French Foley catheter  was inserted.  The balloon was filled with 10 mL of sterile fluid.  The  catheter was irrigated; irrigant returned clear.  The patient was taken down  from lithotomy position.  His anesthetic was reversed.  He was moved to the  recovery room in stable condition; there were no complications.      Excell Seltzer. Annabell Howells, M.D.  Electronically Signed     JJW/MEDQ  D:  10/16/2005  T:  10/16/2005  Job:  [id number]   cc:   Iva Boop, M.D. [facility]  [address] [place], [place] [address]   [name], M.D.  Fax: [phone]

## 2011-01-24 NOTE — Op Note (Signed)
[place]. [facility]  Patient:    Dan [name], Dan [name]                    MRN: [id number] Proc. Date: 07/03/00 Adm. Date:  [id number] Attending:  Meredith Leeds                           Operative Report  PREOPERATIVE DIAGNOSES: 1. Direct left inguinal hernia. 2. History of balanitis and posthitis.  POSTOPERATIVE DIAGNOSES: 1. Direct left inguinal hernia. 2. History of balanitis and posthitis.  OPERATION: 1. Repair of left inguinal hernia. 2. Circumcision.  SURGEON:  Zigmund Daniel, M.D.  ANESTHESIA:  General.  DESCRIPTION OF PROCEDURE:  After adequate general anesthesia and routine preparation and draping of the genitalia and the left inguinal region, I made an oblique incision beginning at the pubic tubercle and going laterally for about 5 cm.  I dissected down through the fat until I identified the external oblique and the external inguinal ring.  I opened the external oblique in the direction of its fibers into the external ring and mobilized the spermatic cord, taking care to protect the ilioinguinal nerve.  I noted a direct hernia. I separated it from the cord.  I did not find an indirect hernia after a thorough search of the proximal part of the cord.  I reduced the hernia and held it in with a plug of polypropylene mesh sewn in with a 2-0 silk stitch. I then fashioned a patch of polypropylene mesh with a slit made in it to allow egress of the spermatic cord, and I sewed it in with 2-0 Prolene suture beginning at the pubic tubercle and sewing superiorly and medially with a basting stitch in the internal oblique.  I sewed it laterally and inferiorly with a whipstitch to the inguinal ligament and put in one stitch lateral to the cord and internal ring with 2-0 Prolene.  This provided a secure repair. I thoroughly anesthetized this area with 0.5% Marcaine with epinephrine.  I closed the external oblique and subcutaneous tissues with  separate layers of running 3-0 Vicryl, and I closed the skin with intracuticular 4-0 Vicryl and Steri-Strips.  I then did a penile block with plain lidocaine.  I marked the limits of the needed circumcision and excised the foreskin.  There was plenty of skin present.  I got hemostasis with the cautery.  I closed the skin with a combination of interrupted and running 4-0 Vicryl sutures and applied a bandage.  The patient tolerated the operation well. DD:  07/03/00 TD:  07/03/00 Job: [id number]

## 2011-01-24 NOTE — Op Note (Signed)
[place]. [facility]  Patient:    Dan [name], Dan [name] Visit Number: [id number] MRN: [id number]          Service Type: DSU Location: DAY Attending Physician:  Shelly Rubenstein Dictated by:   Abigail Miyamoto, M.D. Proc. Date: 10/28/01 Admit Date:  10/28/2001 Discharge Date: 10/28/2001                             Operative Report  PREOPERATIVE DIAGNOSIS: 1. Right inguinal hernia. 2. Chronic left groin pain.  POSTOPERATIVE DIAGNOSIS: 1. Right inguinal hernia. 2. Chronic left groin pain.  OPERATION PERFORMED: 1. Laparoscopic repair of right inguinal hernia with mesh. 2. Laparoscopic left groin exploration and placement of mesh.  SURGEON:  Abigail Miyamoto, M.D.  ANESTHESIA:  General endotracheal anesthesia and 0.25% Marcaine plain.  ESTIMATED BLOOD LOSS:  Minimal.  INDICATIONS FOR PROCEDURE:  The patient is a 65 year old gentleman who had a left inguinal hernia repair with mesh in an open fashion.  Since then he has had chronic pain.  He now presents with chronic pain as well as new right inguinal hernia.  Decision was made to proceed with exploration and repair laparoscopically.  OPERATIVE FINDINGS:  The patient was found to have what appeared to be a direct right inguinal hernia.  The left groin area was examined and the hernia repair was felt to be intact.  Extensive dissection was done in the area in order to see if there was any cause of his pain.  Secondary to this dissection, a piece of mesh was placed in the left groin as well.  DESCRIPTION OF PROCEDURE:  Patient brought to operating room and identified as Dan [name].  He was placed supine on the operating table and general anesthesia was induced.  His abdomen was then prepped and draped in the usual sterile fashion.  Using a #15 blade a small transverse incision was made below the umbilicus.  The incision was carried down through the fascia with the scalpel.  The rectus muscle was  identified and elevated and the dissecting balloon was passed underneath the rectus sheath to the pubis.  Dissection of the preperitoneal space with the dissecting balloon was then instituted.  The camera was inserted demonstrating good deployment of the dissecting balloon. The larger balloon was then removed and the smaller balloon ____________ port was placed through the incision and insufflation of the preperitoneal space with CO2 was achieved.  Next, two 5 mm ports were placed on the patients right flank under direct vision.  Dissection was then carried out in the left groin.  The patient was found to have an intact inguinal hernia repair on that side and it was felt that this may have represented a direct hernia. Mesh could be visualized.  Again, no other defect could be found.  The cord was examined thoroughly.  Again, no cause for his pain was easily apparent.  Next, dissection was carried out in the right groin.  Again the ____________ structures were easily identified.  The patient was found to have a moderate sized direct hernia defect.  Cooper ligament was identified during the dissection.  Because of loss of insufflation of preperitoneal space, a Veress needle was inserted in the right abdomen which relieved the pneumoperitoneum that had been created.  At this point two pieces of Bard Prolene mesh were brought onto the field.  The large piece of left mesh was placed into the right inguinal area and placed  in an onlay fashion over the cord structures as well as the direct defect.  It was then tacked into the Cooper ligament then up the medial abdominal wall then out laterally giving excellent coverage of the defect.  Next a piece of mesh was likewise placed in onlay fashion into the left inguinal area.  Only three to four tacks were used to tack this in in the inguinal area.  Again, good deployment of mesh appeared to be achieved in both groin areas.  At this point insufflation  was stopped and the 5 mm ports were removed under direct vision and the preperitoneal space was deflated removing the rest of the gas.  The mesh appeared to lie appropriately as the space collapsed.  The port at the umbilicus was then removed.  The fascial defect at the umbilicus was closed with several figure-of-eight 0 Vicryl sutures.  All incisions were anesthetized with 0.25% Marcaine and nerve blocks were also performed with Marcaine in the ilioinguinal areas.  All incisions were then closed with 4-0 Monocryl and Steri-Strips.  The patient tolerated the procedure well.  Sponge, needle and instrument counts were correct at the end of the procedure. The patient was then extubated in the operating room and taken in stable condition to recovery room.  All sponge, needle and instrument counts were correct at the end of the procedure.  The patient was then extubated in the operating room and taken in stable condition to the recovery room. Dictated by:   Abigail Miyamoto, M.D. Attending Physician:  Shelly Rubenstein DD:  10/28/01 TD:  10/28/01 Job: [id number]
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
## 2011-01-24 NOTE — Op Note (Signed)
NAMEHAGOP, MCCOLLAM                 ACCOUNT NO.:  [id number]   MEDICAL RECORD NO.:  [id number]          PATIENT TYPE:  AMB   LOCATION:  [facility]                         FACILITY:  [facility]   PHYSICIAN:  Excell Seltzer. Annabell Howells, M.D.    DATE OF BIRTH:  June 30, 1946   DATE OF PROCEDURE:  11/04/2005  DATE OF DISCHARGE:                                 OPERATIVE REPORT   PROCEDURE:  Cystoscopy, removal of left double-J sten,t left ureteroscopy  and then reinsertion of left double-J stent.   PREOPERATIVE DIAGNOSIS:  History of bladder tumors with left distal ureteral  involvement.   POSTOPERATIVE DIAGNOSIS:  History of bladder tumors with left distal  ureteral involvement with resolution of ureteral tumor.   SURGEON:  Dr. Bjorn Pippin.   ANESTHESIA:  General.   DRAIN:  6-French with 24 cm double-J stent.   COMPLICATIONS:  None.   INDICATIONS:  Mr. Turgeon is a 65 year old white male who was found to have  bladder tumors 2-3 weeks ago. He underwent TURBT. He had one tumor over the  left ureteral orifice and following resection, there appeared to be some  residual papillary fronds on the mucosa of the transected ureteral meatus.  At that point, a stent was placed as I did not feel it was safe to try to do  ureteroscopy. He returns today for further evaluation.   FINDINGS AND PROCEDURE:  The patient was given Cipro, he was taken to the  operating room where a general anesthetic was induced. He was placed in  lithotomy position. His perineum and genitalia were prepped with Betadine  solution, he was draped in the usual sterile fashion. Cystoscopy was  performed using a 22-French scope and 12 degree lens. Examination  demonstrated normal urethra, external sphincter was intact, the prostatic  urethra had BPH with some obstruction. Examination of the bladder revealed  recent resection sites at the left trigone with a stent coming out of the  middle of the resection site and the left lateral wall. These areas  appeared  to be healing well. No additional papillary tumors were identified.   The stent was grasped with a grasping forceps and removed to the urethral  meatus. An attempt was made to wire out the stent. The wire went through a  side hole and the stent was removed .   The cystoscope was reinserted and the guidewire was then placed up through  the left ureteral orifice which was easily visualized and advanced to the  kidney under fluoroscopic guidance.   A 6-French short ureteroscope was then passed alongside the wire. Inspection  revealed some postsurgical changes in the area of the meatus but no residual  papillary tumor fronds were noted. It is most likely that the tumor in this  area was sufficiently fulgurated and with the initial resection that the  tumor was destroyed. There were inflammatory changes in the distal ureter  but nothing at all to suggest residual transitional cell carcinoma. I did  not biopsy in this area. I then for completeness passed a 6-French flexible  ureteroscope to the kidney, inspected the  entire intrarenal collecting  system and proximal ureter, no tumors were seen.   The wire was passed back through the 6-French flexible scope which was then  removed. The cystoscope was then reinserted over the wire and a 6-French 24  cm double stent was inserted over the wire to the kidney under fluoroscopic  guidance. The wire was removed leaving a good coil in the kidney and good  coil in the bladder.   At this point the bladder was drained and the patient was taken down from  lithotomy position. His anesthetic was reversed and he was moved to the  recovery room in stable condition. He was given a B&O suppository. There  were no complications.      Excell Seltzer. Annabell Howells, M.D.  Electronically Signed     JJW/MEDQ  D:  11/04/2005  T:  11/05/2005  Job:  [id number]

## 2011-02-11 ENCOUNTER — Encounter: Payer: Self-pay | Admitting: Physician Assistant

## 2011-04-08 ENCOUNTER — Other Ambulatory Visit: Payer: Self-pay | Admitting: Family Medicine

## 2011-04-10 ENCOUNTER — Ambulatory Visit
Admission: RE | Admit: 2011-04-10 | Discharge: 2011-04-10 | Disposition: A | Discharge status: Home / Self Care | Payer: BC Managed Care – PPO | Source: Ambulatory Visit | Attending: Family Medicine | Admitting: Family Medicine

## 2011-04-10 MED ORDER — IOHEXOL 300 MG/ML  SOLN
125.0000 mL | Freq: Once | INTRAMUSCULAR | Status: AC | PRN
  Administered 2011-04-10: 125 mL via INTRAVENOUS

## 2011-04-22 ENCOUNTER — Other Ambulatory Visit: Payer: Self-pay | Admitting: Urology

## 2011-04-22 ENCOUNTER — Ambulatory Visit (HOSPITAL_COMMUNITY)
Admission: RE | Admit: 2011-04-22 | Discharge: 2011-04-22 | Disposition: A | Discharge status: Home / Self Care | Payer: BC Managed Care – PPO | Source: Ambulatory Visit | Attending: Urology | Admitting: Urology

## 2011-04-22 ENCOUNTER — Encounter (HOSPITAL_COMMUNITY): Payer: BC Managed Care – PPO

## 2011-04-22 DIAGNOSIS — Z01810 Encounter for preprocedural cardiovascular examination: Secondary | ICD-10-CM | POA: Insufficient documentation

## 2011-04-22 DIAGNOSIS — R9431 Abnormal electrocardiogram [ECG] [EKG]: Secondary | ICD-10-CM | POA: Insufficient documentation

## 2011-04-22 DIAGNOSIS — Z01811 Encounter for preprocedural respiratory examination: Secondary | ICD-10-CM | POA: Insufficient documentation

## 2011-04-22 DIAGNOSIS — I10 Essential (primary) hypertension: Secondary | ICD-10-CM | POA: Insufficient documentation

## 2011-04-22 DIAGNOSIS — D494 Neoplasm of unspecified behavior of bladder: Secondary | ICD-10-CM | POA: Insufficient documentation

## 2011-04-22 DIAGNOSIS — Z79899 Other long term (current) drug therapy: Secondary | ICD-10-CM | POA: Insufficient documentation

## 2011-04-22 DIAGNOSIS — Z01812 Encounter for preprocedural laboratory examination: Secondary | ICD-10-CM | POA: Insufficient documentation

## 2011-04-22 LAB — BASIC METABOLIC PANEL
BUN: 11 mg/dL (ref 6–23)
Calcium: 10.4 mg/dL (ref 8.4–10.5)
Creatinine, Ser: 0.82 mg/dL (ref 0.50–1.35)
GFR calc Af Amer: >60 mL/min (ref >60)
GFR calc non Af Amer: >60 mL/min (ref >60)
Glucose, Bld: 295 mg/dL — ABNORMAL HIGH (ref 70–99)

## 2011-04-22 LAB — CBC
MCV: 85.5 fL (ref 78.0–100.0)
Platelets: 165 10*3/uL (ref 150–400)
RBC: 5.17 MIL/uL (ref 4.22–5.81)
RDW: 11.9 % (ref 11.5–15.5)
WBC: 9.1 10*3/uL (ref 4.0–10.5)

## 2011-04-24 ENCOUNTER — Other Ambulatory Visit: Payer: Self-pay | Admitting: Urology

## 2011-04-24 ENCOUNTER — Ambulatory Visit (HOSPITAL_COMMUNITY)
Admission: RE | Admit: 2011-04-24 | Discharge: 2011-04-26 | Disposition: A | Discharge status: Home / Self Care | Payer: BC Managed Care – PPO | Source: Ambulatory Visit | Attending: Urology | Admitting: Urology

## 2011-04-24 DIAGNOSIS — E7800 Pure hypercholesterolemia, unspecified: Secondary | ICD-10-CM | POA: Insufficient documentation

## 2011-04-24 DIAGNOSIS — N401 Enlarged prostate with lower urinary tract symptoms: Secondary | ICD-10-CM | POA: Insufficient documentation

## 2011-04-24 DIAGNOSIS — C672 Malignant neoplasm of lateral wall of bladder: Secondary | ICD-10-CM | POA: Insufficient documentation

## 2011-04-24 DIAGNOSIS — I10 Essential (primary) hypertension: Secondary | ICD-10-CM | POA: Insufficient documentation

## 2011-04-24 DIAGNOSIS — Z01812 Encounter for preprocedural laboratory examination: Secondary | ICD-10-CM | POA: Insufficient documentation

## 2011-04-24 DIAGNOSIS — E119 Type 2 diabetes mellitus without complications: Secondary | ICD-10-CM | POA: Insufficient documentation

## 2011-04-24 DIAGNOSIS — Z01810 Encounter for preprocedural cardiovascular examination: Secondary | ICD-10-CM | POA: Insufficient documentation

## 2011-04-24 DIAGNOSIS — Z91041 Radiographic dye allergy status: Secondary | ICD-10-CM | POA: Insufficient documentation

## 2011-04-24 DIAGNOSIS — Z01818 Encounter for other preprocedural examination: Secondary | ICD-10-CM | POA: Insufficient documentation

## 2011-04-24 LAB — GLUCOSE, CAPILLARY
Glucose-Capillary: 318 mg/dL — ABNORMAL HIGH (ref 70–99)
Glucose-Capillary: 296 mg/dL — ABNORMAL HIGH (ref 70–99)
Glucose-Capillary: 247 mg/dL — ABNORMAL HIGH (ref 70–99)
Glucose-Capillary: 208 mg/dL — ABNORMAL HIGH (ref 70–99)

## 2011-04-25 LAB — BASIC METABOLIC PANEL
BUN: 7 mg/dL (ref 6–23)
Calcium: 8.8 mg/dL (ref 8.4–10.5)
Creatinine, Ser: 0.77 mg/dL (ref 0.50–1.35)
GFR calc non Af Amer: >60 mL/min (ref >60)
Glucose, Bld: 214 mg/dL — ABNORMAL HIGH (ref 70–99)
Sodium: 136 mEq/L (ref 135–145)

## 2011-04-25 LAB — GLUCOSE, CAPILLARY: Glucose-Capillary: 234 mg/dL — ABNORMAL HIGH (ref 70–99)

## 2011-04-25 NOTE — Op Note (Signed)
[name], [name]                 ACCOUNT NO.:  [id number]  MEDICAL RECORD NO.:  [id number]  LOCATION:  1414                         FACILITY:  [facility]  PHYSICIAN:  Excell Seltzer. Annabell Howells, M.D.    DATE OF BIRTH:  1946/03/16  DATE OF PROCEDURE:  04/24/2011 DATE OF DISCHARGE:                              OPERATIVE REPORT   PROCEDURE: 1. Cystoscopy, bilateral retrograde pyelograms with interpretation. 2. Transurethral section of 3 to 5 cm bladder tumor. 3. Transurethral resection of the prostate.  PREOPERATIVE DIAGNOSES:  Bladder tumor and benign prostatic hyperplasia with bladder outlet obstruction.  POSTOPERATIVE DIAGNOSES:  Bladder tumor and benign prostatic hyperplasia with bladder outlet obstruction.  SURGEON:  Excell Seltzer. Annabell Howells, MD  ANESTHESIA:  General.  SPECIMENS:  Bladder tumor biopsy and prostate chips.  DRAINS:  22-French three-way Foley catheter.  BLOOD LOSS:  200 mL.  COMPLICATIONS:  Small extraperitoneal bladder perforation.  INDICATIONS:  Dan [name] is a 65 year old white male with history of bladder tumors.  He returned for surveillance cystoscopy and was found to have a patch of papillary fronds on the left lateral wall approximately 2 to 3 cm in size adjacent to his prior resection scar.  He also has a large prostate with an obstructing middle lobe and has had persistent voiding symptoms despite alfuzosin.  He has elected to undergo resection of the bladder tumor and transurethral resection of the prostate, needs upper tract studies with retrograde pyelography.  FINDINGS/DESCRIPTION OF PROCEDURE:  He was given Cipro.  He was taken to the operating room where he was fitted with PAS hose.  A general anesthetic was induced.  He was placed in lithotomy position.  His perineum and genitalia were prepped with Betadine solution.  He was draped in the usual sterile fashion.  Time-out was performed. Cystoscopy was performed using a 22-French scope, 12 and 72 degrees lenses.   Examination revealed a normal urethra.  The external sphincter was intact.  The prostatic urethra was approximately 3 cm in length with trilobar hyperplasia with a large middle lobe.  Inspection of the bladder revealed normal ureteral orifices.  On the left lateral wall, there was a stellate scar and inferior to this was a patch of papillary fronds that extended approximately 2 x 3 cm in area.  No other bladder wall lesions were noted.  After cystoscopy was performed, bilateral retrograde pyelograms were performed with a 5-French open ended catheter and contrast.  The right retrograde pyelogram revealed a normal ureter and internal collecting system.  The left retrograde pyelogram revealed a normal ureter and internal collecting system.  He then underwent cup biopsy of the papillary fronds.  Because of the nature of the bladder tumor, I elected to use a cup biopsy forceps to take a representative biopsy from the center of the papillary frond patch.  This was done without difficulty.  The bladder wall here was thin.  Fat was noted.  His urethra was then calibrated to 30-French with Sissy Hoff sounds and a continuous flow resectoscope sheath was placed with an [place] handle and Gyrus loop with saline irrigant.  The remaining papillary fronds were fulgurated with a wide margin.  Inspection revealed no flow through  the lesions suggestive of extravasation, so it was felt that TURP was indicated at this time.  He then underwent transurethral resection of the prostate.  The middle lobe was resected down to the bladder neck fibers, followed by the floor of the prostate out to and alongside the verumontanum.  The left lobe of prostate was resected from the bladder neck to apex out to the capsular fibers.  There was some capsular penetration near the bladder neck on the left.  He then underwent resection of the right lobe from bladder neck to apex out to the capsular fibers.  At this  point some residual apical and anterior tissue was resected. Hemostasis was achieved and the bladder was evacuated free of chips. Once all chips were removed and no active bleeding was noted, the resectoscope was removed.  Pressure on the bladder produced a good stream.  A 22-French three-way Foley catheter was placed with the aid of a catheter guide.  The balloon was filled with 30 mL sterile fluid.  The catheter was irrigated with clear return.  At this point I performed a general cystogram with 60 mL of fluid. There was very minimal extravesical extravasation at the point of the bladder biopsy.  At this point it was felt that continuous irrigation was not indicated so the irrigation port was plugged.  The catheter was placed on traction to reduce the risk of bleeding and then placed to straight drainage.  The drapes were removed.  The catheter was secured to the patient's leg on traction.  He was taken down from lithotomy position.  His anesthetic was reversed.  He was taken to the recovery room in stable condition.  Complications included a small extraperitoneal bladder perforation due to a very thin wall of the bladder on the left.     Excell Seltzer. Annabell Howells, M.D.     JJW/MEDQ  D:  04/24/2011  T:  04/24/2011  Job:  [id number]  cc:   Dan [name], M.D. Fax: [phone]  Electronically Signed by Bjorn Pippin M.D. on 04/25/2011 01:31:28 PM

## 2011-04-26 LAB — GLUCOSE, CAPILLARY: Glucose-Capillary: 283 mg/dL — ABNORMAL HIGH (ref 70–99)

## 2012-10-22 ENCOUNTER — Encounter: Payer: Self-pay | Admitting: Internal Medicine

## 2012-10-25 ENCOUNTER — Encounter: Payer: Self-pay | Admitting: Internal Medicine

## 2012-12-03 ENCOUNTER — Ambulatory Visit (INDEPENDENT_AMBULATORY_CARE_PROVIDER_SITE_OTHER): Payer: BC Managed Care – PPO | Admitting: Physician Assistant

## 2012-12-03 ENCOUNTER — Encounter: Payer: Self-pay | Admitting: Physician Assistant

## 2012-12-03 VITALS — BP 158/81 | HR 89 | Temp 97.6°F | Ht 71.0 in | Wt 231.2 lb

## 2012-12-03 DIAGNOSIS — I10 Essential (primary) hypertension: Secondary | ICD-10-CM

## 2012-12-03 DIAGNOSIS — E785 Hyperlipidemia, unspecified: Secondary | ICD-10-CM

## 2012-12-03 DIAGNOSIS — M549 Dorsalgia, unspecified: Secondary | ICD-10-CM

## 2012-12-03 DIAGNOSIS — G4700 Insomnia, unspecified: Secondary | ICD-10-CM

## 2012-12-03 DIAGNOSIS — E039 Hypothyroidism, unspecified: Secondary | ICD-10-CM

## 2012-12-03 DIAGNOSIS — E119 Type 2 diabetes mellitus without complications: Secondary | ICD-10-CM

## 2012-12-03 DIAGNOSIS — F411 Generalized anxiety disorder: Secondary | ICD-10-CM

## 2012-12-03 LAB — COMPREHENSIVE METABOLIC PANEL
Albumin: 4.5 g/dL (ref 3.5–5.2)
BUN: 9 mg/dL (ref 6–23)
Calcium: 9.6 mg/dL (ref 8.4–10.5)
Chloride: 103 mEq/L (ref 96–112)
Glucose, Bld: 120 mg/dL — ABNORMAL HIGH (ref 70–99)
Potassium: 4.5 mEq/L (ref 3.5–5.3)
Sodium: 141 mEq/L (ref 135–145)
Total Protein: 6.7 g/dL (ref 6.0–8.3)

## 2012-12-03 LAB — POCT CBC
HCT, POC: 46.8 % (ref 43.5–53.7)
Hemoglobin: 15.8 g/dL (ref 14.1–18.1)
Lymph, poc: 1.9 (ref 0.6–3.4)
MCH, POC: 30 pg (ref 27–31.2)
MCHC: 33.8 g/dL (ref 31.8–35.4)
MCV: 88.9 fL (ref 80–97)
RBC: 5.3 M/uL (ref 4.69–6.13)
WBC: 6.8 10*3/uL (ref 4.6–10.2)

## 2012-12-03 MED ORDER — HYDROCODONE-ACETAMINOPHEN 5-300 MG PO TABS: 5.0000 mg | ORAL_TABLET | Freq: Three times a day (TID) | ORAL | Status: DC | PRN

## 2012-12-03 MED ORDER — LEVOTHYROXINE SODIUM 175 MCG PO TABS: 175.0000 ug | ORAL_TABLET | Freq: Every day | ORAL | Status: DC

## 2012-12-03 MED ORDER — MELOXICAM 15 MG PO TABS
15.0000 mg | ORAL_TABLET | Freq: Every day | ORAL | Status: DC
Start: 2012-12-03 — End: 2013-03-16

## 2012-12-03 MED ORDER — METHOCARBAMOL 750 MG PO TABS: 750.0000 mg | ORAL_TABLET | ORAL | Status: DC | PRN

## 2012-12-03 MED ORDER — AMLODIPINE BESY-BENAZEPRIL HCL 10-20 MG PO CAPS: 1.0000 | ORAL_CAPSULE | Freq: Every day | ORAL | Status: DC

## 2012-12-03 MED ORDER — HYDROXYZINE HCL 25 MG PO TABS: 25.0000 mg | ORAL_TABLET | ORAL | Status: DC | PRN

## 2012-12-03 MED ORDER — ZOLPIDEM TARTRATE 10 MG PO TABS: 10.0000 mg | ORAL_TABLET | Freq: Every evening | ORAL | Status: DC | PRN

## 2012-12-03 MED ORDER — FUROSEMIDE 20 MG PO TABS: 20.0000 mg | ORAL_TABLET | Freq: Every day | ORAL | Status: DC

## 2012-12-03 MED ORDER — ATORVASTATIN CALCIUM 20 MG PO TABS: 20.0000 mg | ORAL_TABLET | Freq: Every day | ORAL | Status: DC

## 2012-12-03 MED ORDER — ALPRAZOLAM 0.25 MG PO TABS: 0.5000 mg | ORAL_TABLET | Freq: Two times a day (BID) | ORAL | Status: DC | PRN

## 2012-12-03 NOTE — Progress Notes (Signed)
Subjective:    Patient ID: Dan [name], male    DOB: [birth year], 67 y.o.   MRN: [id number]  HPI Regularly scheduled follow up on anxiety, back pain; itchy groin   Review of Systems  All other systems reviewed and are negative.       Objective:   Physical Exam  Constitutional: He is oriented to person, place, and time. He appears well-developed and well-nourished.  HENT:  Head: Normocephalic and atraumatic.  Right Ear: External ear normal.  Left Ear: External ear normal.  Nose: Nose normal.  Mouth/Throat: Oropharynx is clear and moist.  dentures  Eyes: Conjunctivae and EOM are normal.  Neck: Normal range of motion. Neck supple. No thyromegaly present.  Cardiovascular: Normal rate, regular rhythm and normal heart sounds.   Pulmonary/Chest: Effort normal and breath sounds normal.  Abdominal: Soft. Bowel sounds are normal.  Truncal obesity, left flank tender due primarily to girth  Genitourinary: Penis normal.  Musculoskeletal:  Lumbar tenderness  Neurological: He is alert and oriented to person, place, and time.  Skin:  Groin scaled, pruritic  Psychiatric: He has a normal mood and affect. His behavior is normal. Judgment and thought content normal.          Assessment & Plan:  Anxiety Back Pain Tinea cruris Meds ordered this encounter  Medications  . DISCONTD: amLODipine-benazepril (LOTREL) 10-20 MG per capsule    Sig: Take 1 capsule by mouth daily.  Marland Kitchen ALPRAZolam (XANAX) 0.25 MG tablet    Sig: Take 2 tablets (0.5 mg total) by mouth 2 (two) times daily as needed.    Dispense:  60 tablet    Refill:  2    Order Specific Question:  Supervising Provider    Answer:  Ernestina Penna [[id number]]  . amLODipine-benazepril (LOTREL) 10-20 MG per capsule    Sig: Take 1 capsule by mouth daily.    Dispense:  30 capsule    Refill:  5    Order Specific Question:  Supervising Provider    Answer:  Ernestina Penna [[id number]]  . atorvastatin (LIPITOR) 20 MG tablet    Sig: Take 1  tablet (20 mg total) by mouth daily.    Dispense:  30 tablet    Refill:  5    Order Specific Question:  Supervising Provider    Answer:  Ernestina Penna [[id number]]  . furosemide (LASIX) 20 MG tablet    Sig: Take 1 tablet (20 mg total) by mouth daily.    Dispense:  30 tablet    Refill:  5    Order Specific Question:  Supervising Provider    Answer:  Ernestina Penna [[id number]]  . Hydrocodone-Acetaminophen 5-300 MG TABS    Sig: Take 5-300 mg by mouth 3 (three) times daily as needed.    Dispense:  120 each    Refill:  0    Order Specific Question:  Supervising Provider    Answer:  Ernestina Penna [[id number]]  . hydrOXYzine (ATARAX/VISTARIL) 25 MG tablet    Sig: Take 1 tablet (25 mg total) by mouth as needed.    Dispense:  30 tablet    Refill:  2    Order Specific Question:  Supervising Provider    Answer:  Ernestina Penna [[id number]]  . levothyroxine (SYNTHROID, LEVOTHROID) 175 MCG tablet    Sig: Take 1 tablet (175 mcg total) by mouth daily.    Dispense:  30 tablet    Refill:  5    Order Specific Question:  Supervising  Provider    Answer:  Ernestina Penna [[id number]]  . meloxicam (MOBIC) 15 MG tablet    Sig: Take 1 tablet (15 mg total) by mouth daily.    Dispense:  30 tablet    Refill:  5    Order Specific Question:  Supervising Provider    Answer:  Ernestina Penna [[id number]]  . methocarbamol (ROBAXIN) 750 MG tablet    Sig: Take 1 tablet (750 mg total) by mouth as needed.    Dispense:  30 tablet    Refill:  0    Order Specific Question:  Supervising Provider    Answer:  Ernestina Penna [[id number]]  . zolpidem (AMBIEN) 10 MG tablet    Sig: Take 1 tablet (10 mg total) by mouth at bedtime as needed.    Dispense:  30 tablet    Refill:  2    Order Specific Question:  Supervising Provider    Answer:  Ernestina Penna [[id number]]   Orders Placed This Encounter  Procedures  . Microalbumin, urine  . Comprehensive metabolic panel  . TSH  . NMR Lipoprofile with Lipids  . POCT CBC  . POCT glycosylated hemoglobin  (Hb A1C)

## 2012-12-07 LAB — NMR LIPOPROFILE WITH LIPIDS
Cholesterol, Total: 139 mg/dL (ref <200)
HDL Particle Number: 27 umol/L — ABNORMAL LOW (ref >=30.5)
LDL (calc): 54 mg/dL (ref <100)
LDL Particle Number: 1290 nmol/L — ABNORMAL HIGH (ref <1000)
LDL Size: 19.7 nm — ABNORMAL LOW (ref >20.5)
LP-IR Score: 79 — ABNORMAL HIGH (ref <=45)
Large VLDL-P: 7.3 nmol/L — ABNORMAL HIGH (ref <=2.7)
Small LDL Particle Number: 976 nmol/L — ABNORMAL HIGH (ref <=527)
VLDL Size: 50.3 nm — ABNORMAL HIGH (ref >=46.6)

## 2012-12-09 ENCOUNTER — Telehealth: Payer: Self-pay | Admitting: Physician Assistant

## 2012-12-09 NOTE — Telephone Encounter (Signed)
Aware of lab results. No complaint to me about feeling bad.

## 2012-12-27 ENCOUNTER — Other Ambulatory Visit: Payer: Self-pay

## 2012-12-27 DIAGNOSIS — I10 Essential (primary) hypertension: Secondary | ICD-10-CM

## 2012-12-27 MED ORDER — AMLODIPINE BESY-BENAZEPRIL HCL 10-20 MG PO CAPS: 1.0000 | ORAL_CAPSULE | Freq: Every day | ORAL | Status: DC

## 2012-12-27 MED ORDER — METFORMIN HCL 1000 MG PO TABS: 1000.0000 mg | ORAL_TABLET | Freq: Two times a day (BID) | ORAL | Status: DC

## 2013-01-08 ENCOUNTER — Ambulatory Visit (INDEPENDENT_AMBULATORY_CARE_PROVIDER_SITE_OTHER): Payer: BC Managed Care – PPO | Admitting: Nurse Practitioner

## 2013-01-08 VITALS — BP 146/80 | HR 86 | Temp 97.1°F | Wt 230.0 lb

## 2013-01-08 DIAGNOSIS — M25512 Pain in left shoulder: Secondary | ICD-10-CM

## 2013-01-08 DIAGNOSIS — M25519 Pain in unspecified shoulder: Secondary | ICD-10-CM

## 2013-01-08 MED ORDER — CYCLOBENZAPRINE HCL 10 MG PO TABS: 10.0000 mg | ORAL_TABLET | Freq: Three times a day (TID) | ORAL | Status: DC | PRN

## 2013-01-08 NOTE — Progress Notes (Signed)
  Subjective:    Patient ID: Dan [name], male    DOB: Oct 05, 1945, 67 y.o.   MRN: [id number]  HPI - Patient here today C/O left shoulder pain. Patient said he fell on 2 months ago and it is not getting any better. Rates pain 5-10/10. Touching it against something increases the pain. Pain radiates down into elbow. Patient has taken pain meds-Vicodin- which helps short term. Has tried soe of his wifes flexeril which helps also.    Review of Systems  Musculoskeletal: Positive for joint swelling.  Neurological: Positive for numbness.  All other systems reviewed and are negative.       Objective:   Physical Exam  Constitutional: He is oriented to person, place, and time. He appears well-developed and well-nourished.  Cardiovascular: Normal rate, normal heart sounds and intact distal pulses.   Pulmonary/Chest: Effort normal and breath sounds normal.  Abdominal: Soft. Bowel sounds are normal.  Musculoskeletal:  Decrease ROM of Left shoulder due to pain on internal rotation.  Anterior left shoulder edema Grips = bil  Neurological: He is alert and oriented to person, place, and time. He has normal reflexes.   BP 146/80  Pulse 86  Temp(Src) 97.1 F (36.2 C) (Oral)  Wt 230 lb (104.327 kg)  BMI 32.09 kg/m2        Assessment & Plan:  1. Left shoulder pain ice Rest Referral to ortho - cyclobenzaprine (FLEXERIL) 10 MG tablet; Take 1 tablet (10 mg total) by mouth 3 (three) times daily as needed for muscle spasms.  Dispense: 30 tablet; Refill: 0  Mary-Margaret Daphine Deutscher, FNP

## 2013-01-08 NOTE — Patient Instructions (Signed)
Shoulder Pain The shoulder is the joint that connects your arms to your body. The bones that form the shoulder joint include the upper arm bone (humerus), the shoulder blade (scapula), and the collarbone (clavicle). The top of the humerus is shaped like a ball and fits into a rather flat socket on the scapula (glenoid cavity). A combination of muscles and strong, fibrous tissues that connect muscles to bones (tendons) support your shoulder joint and hold the ball in the socket. Small, fluid-filled sacs (bursae) are located in different areas of the joint. They act as cushions between the bones and the overlying soft tissues and help reduce friction between the gliding tendons and the bone as you move your arm. Your shoulder joint allows a wide range of motion in your arm. This range of motion allows you to do things like scratch your back or throw a ball. However, this range of motion also makes your shoulder more prone to pain from overuse and injury. Causes of shoulder pain can originate from both injury and overuse and usually can be grouped in the following four categories:  Redness, swelling, and pain (inflammation) of the tendon (tendinitis) or the bursae (bursitis).  Instability, such as a dislocation of the joint.  Inflammation of the joint (arthritis).  Broken bone (fracture). HOME CARE INSTRUCTIONS   Apply ice to the sore area.  Put ice in a plastic bag.  Place a towel between your skin and the bag.  Leave the ice on for 15 to 20 minutes, 3 to 4 times per day for the first 2 days.  If you have a shoulder sling or immobilizer, wear it as long as your caregiver instructs. Only remove it to shower or bathe. Move your arm as little as possible, but keep your hand moving to prevent swelling.  Only take over-the-counter or prescription medicines for pain, discomfort, or fever as directed by your caregiver. SEEK MEDICAL CARE IF:   Your shoulder pain increases, or new pain develops in  your arm, hand, or fingers.  Your hand or fingers become cold and numb.  Your pain is not relieved with medicines. SEEK IMMEDIATE MEDICAL CARE IF:   Your arm, hand, or fingers are numb or tingling.  Your arm, hand, or fingers are significantly swollen or turn white or blue. MAKE SURE YOU:   Understand these instructions.  Will watch your condition.  Will get help right away if you are not doing well or get worse. Document Released: 06/04/2005 Document Revised: 11/17/2011 Document Reviewed: 08/09/2011 [employer] Patient Information 2013 [employer].  

## 2013-01-17 ENCOUNTER — Ambulatory Visit: Payer: BC Managed Care – PPO | Attending: Orthopaedic Surgery | Admitting: Physical Therapy

## 2013-01-17 DIAGNOSIS — M25519 Pain in unspecified shoulder: Secondary | ICD-10-CM | POA: Insufficient documentation

## 2013-01-17 DIAGNOSIS — M25619 Stiffness of unspecified shoulder, not elsewhere classified: Secondary | ICD-10-CM | POA: Insufficient documentation

## 2013-01-17 DIAGNOSIS — R5381 Other malaise: Secondary | ICD-10-CM | POA: Insufficient documentation

## 2013-01-17 DIAGNOSIS — IMO0001 Reserved for inherently not codable concepts without codable children: Secondary | ICD-10-CM | POA: Insufficient documentation

## 2013-01-17 DIAGNOSIS — W19XXXA Unspecified fall, initial encounter: Secondary | ICD-10-CM | POA: Insufficient documentation

## 2013-01-19 ENCOUNTER — Ambulatory Visit: Payer: BC Managed Care – PPO | Admitting: *Deleted

## 2013-01-19 ENCOUNTER — Other Ambulatory Visit: Payer: Self-pay | Admitting: Nurse Practitioner

## 2013-01-20 NOTE — Telephone Encounter (Signed)
This was just ordered on 01/08/13 but only 30 was given and he takes tid

## 2013-01-24 ENCOUNTER — Ambulatory Visit: Payer: BC Managed Care – PPO | Admitting: *Deleted

## 2013-01-28 ENCOUNTER — Encounter: Payer: BC Managed Care – PPO | Admitting: *Deleted

## 2013-02-10 ENCOUNTER — Other Ambulatory Visit: Payer: Self-pay | Admitting: Physician Assistant

## 2013-02-10 DIAGNOSIS — M25512 Pain in left shoulder: Secondary | ICD-10-CM

## 2013-02-15 ENCOUNTER — Other Ambulatory Visit: Payer: Self-pay | Admitting: *Deleted

## 2013-02-15 MED ORDER — GABAPENTIN 300 MG PO CAPS: 300.0000 mg | ORAL_CAPSULE | Freq: Three times a day (TID) | ORAL | Status: DC

## 2013-02-15 NOTE — Telephone Encounter (Signed)
EPIC HAS GABAPENTIN BID. FAX FROM [facility] TID. LAST RF 01/13/13.

## 2013-02-15 NOTE — Telephone Encounter (Signed)
Pt aware.

## 2013-02-15 NOTE — Telephone Encounter (Signed)
rx sent to pharmacy

## 2013-02-19 ENCOUNTER — Ambulatory Visit
Admission: RE | Admit: 2013-02-19 | Discharge: 2013-02-19 | Disposition: A | Discharge status: Home / Self Care | Payer: BC Managed Care – PPO | Source: Ambulatory Visit | Attending: Physician Assistant | Admitting: Physician Assistant

## 2013-02-19 DIAGNOSIS — M25512 Pain in left shoulder: Secondary | ICD-10-CM

## 2013-02-23 ENCOUNTER — Other Ambulatory Visit (HOSPITAL_COMMUNITY): Payer: Self-pay | Admitting: Orthopaedic Surgery

## 2013-02-23 ENCOUNTER — Other Ambulatory Visit: Payer: Self-pay | Admitting: Nurse Practitioner

## 2013-02-24 ENCOUNTER — Other Ambulatory Visit: Payer: Self-pay | Admitting: Nurse Practitioner

## 2013-03-16 ENCOUNTER — Encounter (HOSPITAL_COMMUNITY): Payer: Self-pay | Admitting: Pharmacy Technician

## 2013-03-18 NOTE — Patient Instructions (Addendum)
20 Dan [name]  03/18/2013   Your procedure is scheduled on: 03/25/13  Report to [facility] at 1:00 PM.  Call this number if you have problems the morning of surgery [phone]: [phone]   Remember:   Do not eat food After Midnight, clear liquids from midnight until 9:30 am on 03/25/13 then nothing.      Take these medicines the morning of surgery with A SIP OF WATER: xanax if needed, atorvastatin, gabapentin, hydrocodone if needed, levothyroxine    Do not wear jewelry, make-up or nail polish.  Do not wear lotions, powders, or perfumes. You may wear deodorant.  Do not shave 48 hours prior to surgery. Men may shave face and neck.  Do not bring valuables to the hospital.  Contacts, dentures or bridgework may not be worn into surgery.  Leave suitcase in the car. After surgery it may be brought to your room.  For patients admitted to the hospital, checkout time is 11:00 AM the day of discharge.    Please read over the following fact sheets that you were given: clear liquids fact sheet Birdie Sons, RN  pre op nurse call if needed [phone]    FAILURE TO FOLLOW THESE INSTRUCTIONS MAY RESULT IN CANCELLATION OF YOUR SURGERY   Patient Signature: ___________________________________________

## 2013-03-21 ENCOUNTER — Encounter (HOSPITAL_COMMUNITY)
Admission: RE | Admit: 2013-03-21 | Discharge: 2013-03-21 | Disposition: A | Discharge status: Home / Self Care | Payer: BC Managed Care – PPO | Source: Ambulatory Visit | Attending: Orthopaedic Surgery | Admitting: Orthopaedic Surgery

## 2013-03-21 ENCOUNTER — Encounter (HOSPITAL_COMMUNITY): Payer: Self-pay

## 2013-03-21 ENCOUNTER — Ambulatory Visit (HOSPITAL_COMMUNITY)
Admission: RE | Admit: 2013-03-21 | Discharge: 2013-03-21 | Disposition: A | Discharge status: Home / Self Care | Payer: BC Managed Care – PPO | Source: Ambulatory Visit | Attending: Orthopaedic Surgery | Admitting: Orthopaedic Surgery

## 2013-03-21 DIAGNOSIS — Z01810 Encounter for preprocedural cardiovascular examination: Secondary | ICD-10-CM | POA: Insufficient documentation

## 2013-03-21 DIAGNOSIS — Z01818 Encounter for other preprocedural examination: Secondary | ICD-10-CM | POA: Insufficient documentation

## 2013-03-21 HISTORY — DX: Pneumonia, unspecified organism: J18.9

## 2013-03-21 LAB — CBC
HCT: 43.5 % (ref 39.0–52.0)
Hemoglobin: 15.4 g/dL (ref 13.0–17.0)
MCH: 30.1 pg (ref 26.0–34.0)
MCV: 85.1 fL (ref 78.0–100.0)
RBC: 5.11 MIL/uL (ref 4.22–5.81)
WBC: 7.6 10*3/uL (ref 4.0–10.5)

## 2013-03-21 LAB — BASIC METABOLIC PANEL
BUN: 12 mg/dL (ref 6–23)
CO2: 24 mEq/L (ref 19–32)
Calcium: 10.4 mg/dL (ref 8.4–10.5)
Chloride: 101 mEq/L (ref 96–112)
Creatinine, Ser: 0.86 mg/dL (ref 0.50–1.35)
Glucose, Bld: 193 mg/dL — ABNORMAL HIGH (ref 70–99)

## 2013-03-25 ENCOUNTER — Ambulatory Visit (HOSPITAL_COMMUNITY): Payer: BC Managed Care – PPO | Admitting: Anesthesiology

## 2013-03-25 ENCOUNTER — Observation Stay (HOSPITAL_COMMUNITY)
Admission: RE | Admit: 2013-03-25 | Discharge: 2013-03-26 | Disposition: A | Discharge status: Home / Self Care | Payer: BC Managed Care – PPO | Source: Ambulatory Visit | Attending: Orthopaedic Surgery | Admitting: Orthopaedic Surgery

## 2013-03-25 ENCOUNTER — Encounter (HOSPITAL_COMMUNITY): Payer: Self-pay | Admitting: *Deleted

## 2013-03-25 ENCOUNTER — Encounter (HOSPITAL_COMMUNITY)
Admission: RE | Disposition: A | Discharge status: Home / Self Care | Payer: Self-pay | Source: Ambulatory Visit | Attending: Orthopaedic Surgery

## 2013-03-25 ENCOUNTER — Encounter (HOSPITAL_COMMUNITY): Payer: Self-pay | Admitting: Anesthesiology

## 2013-03-25 DIAGNOSIS — M898X9 Other specified disorders of bone, unspecified site: Secondary | ICD-10-CM | POA: Insufficient documentation

## 2013-03-25 DIAGNOSIS — R296 Repeated falls: Secondary | ICD-10-CM | POA: Insufficient documentation

## 2013-03-25 DIAGNOSIS — IMO0002 Reserved for concepts with insufficient information to code with codable children: Secondary | ICD-10-CM | POA: Insufficient documentation

## 2013-03-25 DIAGNOSIS — M67919 Unspecified disorder of synovium and tendon, unspecified shoulder: Secondary | ICD-10-CM | POA: Insufficient documentation

## 2013-03-25 DIAGNOSIS — Z79899 Other long term (current) drug therapy: Secondary | ICD-10-CM | POA: Insufficient documentation

## 2013-03-25 DIAGNOSIS — I10 Essential (primary) hypertension: Secondary | ICD-10-CM | POA: Insufficient documentation

## 2013-03-25 DIAGNOSIS — M75100 Unspecified rotator cuff tear or rupture of unspecified shoulder, not specified as traumatic: Secondary | ICD-10-CM | POA: Insufficient documentation

## 2013-03-25 DIAGNOSIS — S43429A Sprain of unspecified rotator cuff capsule, initial encounter: Secondary | ICD-10-CM | POA: Insufficient documentation

## 2013-03-25 DIAGNOSIS — M25819 Other specified joint disorders, unspecified shoulder: Principal | ICD-10-CM | POA: Insufficient documentation

## 2013-03-25 DIAGNOSIS — M719 Bursopathy, unspecified: Secondary | ICD-10-CM | POA: Insufficient documentation

## 2013-03-25 DIAGNOSIS — E119 Type 2 diabetes mellitus without complications: Secondary | ICD-10-CM | POA: Insufficient documentation

## 2013-03-25 DIAGNOSIS — M75122 Complete rotator cuff tear or rupture of left shoulder, not specified as traumatic: Secondary | ICD-10-CM

## 2013-03-25 HISTORY — PX: SHOULDER ARTHROSCOPY WITH ROTATOR CUFF REPAIR AND SUBACROMIAL DECOMPRESSION: SHX5686

## 2013-03-25 LAB — GLUCOSE, CAPILLARY
Glucose-Capillary: 123 mg/dL — ABNORMAL HIGH (ref 70–99)
Glucose-Capillary: 159 mg/dL — ABNORMAL HIGH (ref 70–99)

## 2013-03-25 SURGERY — SHOULDER ARTHROSCOPY WITH ROTATOR CUFF REPAIR AND SUBACROMIAL DECOMPRESSION
Anesthesia: General | Site: Shoulder | Laterality: Left | Wound class: Clean

## 2013-03-25 MED ORDER — FUROSEMIDE 20 MG PO TABS
20.0000 mg | ORAL_TABLET | Freq: Every day | ORAL | Status: DC
  Filled 2013-03-25: qty 1

## 2013-03-25 MED ORDER — SODIUM CHLORIDE 0.9 % IV SOLN
INTRAVENOUS | Status: DC
  Administered 2013-03-26: 03:00:00 via INTRAVENOUS

## 2013-03-25 MED ORDER — MEPERIDINE HCL 50 MG/ML IJ SOLN: 6.2500 mg | INTRAMUSCULAR | Status: DC | PRN

## 2013-03-25 MED ORDER — CELECOXIB 200 MG PO CAPS
200.0000 mg | ORAL_CAPSULE | Freq: Once | ORAL | Status: AC
  Administered 2013-03-25: 200 mg via ORAL
  Filled 2013-03-25: qty 1

## 2013-03-25 MED ORDER — ATORVASTATIN CALCIUM 40 MG PO TABS
40.0000 mg | ORAL_TABLET | Freq: Every day | ORAL | Status: DC
  Filled 2013-03-25: qty 1

## 2013-03-25 MED ORDER — GABAPENTIN 300 MG PO CAPS
300.0000 mg | ORAL_CAPSULE | Freq: Three times a day (TID) | ORAL | Status: DC
  Administered 2013-03-25: 300 mg via ORAL
  Filled 2013-03-25 (×4): qty 1

## 2013-03-25 MED ORDER — DIPHENHYDRAMINE HCL 12.5 MG/5ML PO ELIX: 12.5000 mg | ORAL_SOLUTION | ORAL | Status: DC | PRN

## 2013-03-25 MED ORDER — CEFAZOLIN SODIUM 1-5 GM-% IV SOLN
1.0000 g | Freq: Four times a day (QID) | INTRAVENOUS | Status: DC
  Administered 2013-03-25 – 2013-03-26 (×2): 1 g via INTRAVENOUS
  Filled 2013-03-25 (×3): qty 50

## 2013-03-25 MED ORDER — LIDOCAINE HCL (CARDIAC) 20 MG/ML IV SOLN
INTRAVENOUS | Status: DC | PRN
  Administered 2013-03-25: 100 mg via INTRAVENOUS

## 2013-03-25 MED ORDER — ONDANSETRON HCL 4 MG PO TABS: 4.0000 mg | ORAL_TABLET | Freq: Four times a day (QID) | ORAL | Status: DC | PRN

## 2013-03-25 MED ORDER — ROCURONIUM BROMIDE 100 MG/10ML IV SOLN
INTRAVENOUS | Status: DC | PRN
  Administered 2013-03-25: 10 mg via INTRAVENOUS
  Administered 2013-03-25: 40 mg via INTRAVENOUS

## 2013-03-25 MED ORDER — HYDROCODONE-ACETAMINOPHEN 5-325 MG PO TABS
1.0000 | ORAL_TABLET | ORAL | Status: DC | PRN
  Administered 2013-03-25: 2 via ORAL
  Filled 2013-03-25: qty 2

## 2013-03-25 MED ORDER — ACETAMINOPHEN 500 MG PO TABS
1000.0000 mg | ORAL_TABLET | Freq: Once | ORAL | Status: AC
  Administered 2013-03-25: 1000 mg via ORAL
  Filled 2013-03-25: qty 2

## 2013-03-25 MED ORDER — METHOCARBAMOL 100 MG/ML IJ SOLN
500.0000 mg | Freq: Four times a day (QID) | INTRAVENOUS | Status: DC | PRN
  Administered 2013-03-25: 500 mg via INTRAVENOUS
  Filled 2013-03-25 (×2): qty 5

## 2013-03-25 MED ORDER — LACTATED RINGERS IR SOLN
Status: DC | PRN
  Administered 2013-03-25: 3000 mL
  Administered 2013-03-25: 6000 mL

## 2013-03-25 MED ORDER — ZOLPIDEM TARTRATE 10 MG PO TABS
10.0000 mg | ORAL_TABLET | Freq: Every day | ORAL | Status: DC
  Administered 2013-03-25: 10 mg via ORAL
  Filled 2013-03-25: qty 1

## 2013-03-25 MED ORDER — EPHEDRINE SULFATE 50 MG/ML IJ SOLN
INTRAMUSCULAR | Status: DC | PRN
  Administered 2013-03-25: 10 mg via INTRAVENOUS
  Administered 2013-03-25: 5 mg via INTRAVENOUS
  Administered 2013-03-25: 10 mg via INTRAVENOUS

## 2013-03-25 MED ORDER — OXYCODONE-ACETAMINOPHEN 5-325 MG PO TABS: 1.0000 | ORAL_TABLET | ORAL | Status: DC | PRN

## 2013-03-25 MED ORDER — ADULT MULTIVITAMIN W/MINERALS CH
1.0000 | ORAL_TABLET | Freq: Every day | ORAL | Status: DC
  Filled 2013-03-25: qty 1

## 2013-03-25 MED ORDER — LEVOTHYROXINE SODIUM 200 MCG PO TABS
200.0000 ug | ORAL_TABLET | Freq: Every day | ORAL | Status: DC
  Administered 2013-03-26: 200 ug via ORAL
  Filled 2013-03-25 (×2): qty 1

## 2013-03-25 MED ORDER — HYDROXYZINE HCL 25 MG PO TABS
25.0000 mg | ORAL_TABLET | Freq: Every evening | ORAL | Status: DC | PRN
  Filled 2013-03-25: qty 1

## 2013-03-25 MED ORDER — METHOCARBAMOL 500 MG PO TABS
500.0000 mg | ORAL_TABLET | Freq: Four times a day (QID) | ORAL | Status: DC | PRN
  Administered 2013-03-25 – 2013-03-26 (×2): 500 mg via ORAL
  Filled 2013-03-25 (×2): qty 1

## 2013-03-25 MED ORDER — METOCLOPRAMIDE HCL 5 MG/ML IJ SOLN: 5.0000 mg | Freq: Three times a day (TID) | INTRAMUSCULAR | Status: DC | PRN

## 2013-03-25 MED ORDER — AMLODIPINE BESYLATE 10 MG PO TABS
10.0000 mg | ORAL_TABLET | Freq: Once | ORAL | Status: AC
  Administered 2013-03-25: 10 mg via ORAL
  Filled 2013-03-25: qty 1

## 2013-03-25 MED ORDER — AMLODIPINE BESY-BENAZEPRIL HCL 10-20 MG PO CAPS: 1.0000 | ORAL_CAPSULE | Freq: Every morning | ORAL | Status: DC

## 2013-03-25 MED ORDER — ONDANSETRON HCL 4 MG/2ML IJ SOLN
INTRAMUSCULAR | Status: DC | PRN
  Administered 2013-03-25: 4 mg via INTRAVENOUS

## 2013-03-25 MED ORDER — METFORMIN HCL 500 MG PO TABS
1000.0000 mg | ORAL_TABLET | Freq: Two times a day (BID) | ORAL | Status: DC
  Administered 2013-03-26: 1000 mg via ORAL
  Filled 2013-03-25 (×3): qty 2

## 2013-03-25 MED ORDER — MORPHINE SULFATE 2 MG/ML IJ SOLN
1.0000 mg | INTRAMUSCULAR | Status: DC | PRN
  Administered 2013-03-25 (×2): 1 mg via INTRAVENOUS
  Filled 2013-03-25 (×2): qty 1

## 2013-03-25 MED ORDER — ZOLPIDEM TARTRATE 5 MG PO TABS: 5.0000 mg | ORAL_TABLET | Freq: Every evening | ORAL | Status: DC | PRN

## 2013-03-25 MED ORDER — ONDANSETRON HCL 4 MG/2ML IJ SOLN: 4.0000 mg | Freq: Four times a day (QID) | INTRAMUSCULAR | Status: DC | PRN

## 2013-03-25 MED ORDER — BUPIVACAINE-EPINEPHRINE 0.5% -1:200000 IJ SOLN
INTRAMUSCULAR | Status: DC | PRN
  Administered 2013-03-25: 30 mL

## 2013-03-25 MED ORDER — OXYCODONE HCL 5 MG PO TABS
5.0000 mg | ORAL_TABLET | ORAL | Status: DC | PRN
  Administered 2013-03-25 – 2013-03-26 (×3): 10 mg via ORAL
  Filled 2013-03-25 (×3): qty 2

## 2013-03-25 MED ORDER — OXYCODONE HCL 5 MG/5ML PO SOLN
5.0000 mg | Freq: Once | ORAL | Status: DC | PRN
  Filled 2013-03-25: qty 5

## 2013-03-25 MED ORDER — BENAZEPRIL HCL 20 MG PO TABS
20.0000 mg | ORAL_TABLET | Freq: Every day | ORAL | Status: DC
  Filled 2013-03-25: qty 1

## 2013-03-25 MED ORDER — AMLODIPINE BESYLATE 10 MG PO TABS
10.0000 mg | ORAL_TABLET | Freq: Every day | ORAL | Status: DC
  Filled 2013-03-25: qty 1

## 2013-03-25 MED ORDER — ALPRAZOLAM 0.25 MG PO TABS: 0.2500 mg | ORAL_TABLET | Freq: Two times a day (BID) | ORAL | Status: DC | PRN

## 2013-03-25 MED ORDER — EPINEPHRINE HCL 1 MG/ML IJ SOLN
INTRAMUSCULAR | Status: DC | PRN
  Administered 2013-03-25: 2 mg

## 2013-03-25 MED ORDER — GLYCOPYRROLATE 0.2 MG/ML IJ SOLN
INTRAMUSCULAR | Status: DC | PRN
  Administered 2013-03-25: .8 mg via INTRAVENOUS

## 2013-03-25 MED ORDER — MORPHINE SULFATE 4 MG/ML IJ SOLN
INTRAMUSCULAR | Status: DC | PRN
  Administered 2013-03-25: 4 mg

## 2013-03-25 MED ORDER — LACTATED RINGERS IV SOLN
INTRAVENOUS | Status: DC | PRN
  Administered 2013-03-25: 14:00:00 via INTRAVENOUS

## 2013-03-25 MED ORDER — HYDROMORPHONE HCL PF 1 MG/ML IJ SOLN
0.2500 mg | INTRAMUSCULAR | Status: DC | PRN
  Administered 2013-03-25 (×4): 0.5 mg via INTRAVENOUS

## 2013-03-25 MED ORDER — METOCLOPRAMIDE HCL 10 MG PO TABS: 5.0000 mg | ORAL_TABLET | Freq: Three times a day (TID) | ORAL | Status: DC | PRN

## 2013-03-25 MED ORDER — CEFAZOLIN SODIUM-DEXTROSE 2-3 GM-% IV SOLR
2.0000 g | INTRAVENOUS | Status: AC
  Administered 2013-03-25: 2 g via INTRAVENOUS

## 2013-03-25 MED ORDER — ACETAMINOPHEN 10 MG/ML IV SOLN
1000.0000 mg | Freq: Once | INTRAVENOUS | Status: DC | PRN
  Filled 2013-03-25: qty 100

## 2013-03-25 MED ORDER — PHENYLEPHRINE HCL 10 MG/ML IJ SOLN
INTRAMUSCULAR | Status: DC | PRN
  Administered 2013-03-25: 40 ug via INTRAVENOUS

## 2013-03-25 MED ORDER — NEOSTIGMINE METHYLSULFATE 1 MG/ML IJ SOLN
INTRAMUSCULAR | Status: DC | PRN
  Administered 2013-03-25: 5 mg via INTRAVENOUS

## 2013-03-25 MED ORDER — INSULIN ASPART 100 UNIT/ML ~~LOC~~ SOLN
0.0000 [IU] | Freq: Three times a day (TID) | SUBCUTANEOUS | Status: DC
  Administered 2013-03-26: 3 [IU] via SUBCUTANEOUS

## 2013-03-25 MED ORDER — MIDAZOLAM HCL 5 MG/5ML IJ SOLN
INTRAMUSCULAR | Status: DC | PRN
  Administered 2013-03-25: 2 mg via INTRAVENOUS

## 2013-03-25 MED ORDER — PROPOFOL 10 MG/ML IV BOLUS
INTRAVENOUS | Status: DC | PRN
  Administered 2013-03-25: 170 mg via INTRAVENOUS

## 2013-03-25 MED ORDER — PROMETHAZINE HCL 25 MG/ML IJ SOLN: 6.2500 mg | INTRAMUSCULAR | Status: DC | PRN

## 2013-03-25 MED ORDER — OXYCODONE HCL 5 MG PO TABS: 5.0000 mg | ORAL_TABLET | Freq: Once | ORAL | Status: DC | PRN

## 2013-03-25 MED ORDER — FENTANYL CITRATE 0.05 MG/ML IJ SOLN
INTRAMUSCULAR | Status: DC | PRN
  Administered 2013-03-25 (×3): 50 ug via INTRAVENOUS

## 2013-03-25 SURGICAL SUPPLY — 42 items
BLADE CUTTER GATOR 3.5 (BLADE) ×2 IMPLANT
BLADE GREAT WHITE 4.2 (BLADE) IMPLANT
BLADE SURG SZ11 CARB STEEL (BLADE) ×2 IMPLANT
BUR OVAL 4.0 (BURR) ×2 IMPLANT
CANNULA 5.75X7 CRYSTAL CLEAR (CANNULA) ×2 IMPLANT
CANNULA ACUFO 5X76 (CANNULA) ×2 IMPLANT
CANNULA TWIST IN 8.25X7CM (CANNULA) ×2 IMPLANT
CHLORAPREP W/TINT 26ML (MISCELLANEOUS) IMPLANT
CLOTH BEACON ORANGE TIMEOUT ST (SAFETY) ×2 IMPLANT
DECANTER SPIKE VIAL GLASS SM (MISCELLANEOUS) IMPLANT
DRAPE ORTHO SPLIT 77X108 STRL (DRAPES)
DRAPE SURG ORHT 6 SPLT 77X108 (DRAPES) IMPLANT
DRAPE U-SHAPE 47X51 STRL (DRAPES) ×4 IMPLANT
DRSG PAD ABDOMINAL 8X10 ST (GAUZE/BANDAGES/DRESSINGS) ×2 IMPLANT
DRSG XEROFORM 1X8 (GAUZE/BANDAGES/DRESSINGS) ×2 IMPLANT
DURAPREP 26ML APPLICATOR (WOUND CARE) ×2 IMPLANT
FILTER STRAW (MISCELLANEOUS) ×2 IMPLANT
GLOVE BIO SURGEON STRL SZ7.5 (GLOVE) ×4 IMPLANT
GLOVE BIOGEL PI IND STRL 8 (GLOVE) ×4 IMPLANT
GLOVE BIOGEL PI INDICATOR 8 (GLOVE) ×4
GLOVE ECLIPSE 8.0 STRL XLNG CF (GLOVE) ×2 IMPLANT
GOWN STRL REIN XL XLG (GOWN DISPOSABLE) ×2 IMPLANT
KIT BASIN OR (CUSTOM PROCEDURE TRAY) ×2 IMPLANT
KIT POSITION SHOULDER SCHLEI (MISCELLANEOUS) IMPLANT
KIT SHOULDER TRACTION (DRAPES) ×2 IMPLANT
MANIFOLD NEPTUNE II (INSTRUMENTS) ×4 IMPLANT
MARKER SKIN DUAL TIP RULER LAB (MISCELLANEOUS) ×2 IMPLANT
NDL SAFETY ECLIPSE 18X1.5 (NEEDLE) ×2 IMPLANT
NEEDLE HYPO 18GX1.5 SHARP (NEEDLE) ×2
NEEDLE SPNL 18GX3.5 QUINCKE PK (NEEDLE) ×2 IMPLANT
PACK SHOULDER CUSTOM OPM052 (CUSTOM PROCEDURE TRAY) ×2 IMPLANT
POSITIONER SURGICAL ARM (MISCELLANEOUS) ×2 IMPLANT
SET ARTHROSCOPY TUBING (MISCELLANEOUS) ×1
SET ARTHROSCOPY TUBING LN (MISCELLANEOUS) ×1 IMPLANT
SLING ARM IMMOBILIZER LRG (SOFTGOODS) ×4 IMPLANT
SLING ARM IMMOBILIZER MED (SOFTGOODS) IMPLANT
SPONGE GAUZE 4X4 12PLY (GAUZE/BANDAGES/DRESSINGS) ×2 IMPLANT
SUT ETHILON 4 0 PS 2 18 (SUTURE) ×2 IMPLANT
SYR 3ML LL SCALE MARK (SYRINGE) ×4 IMPLANT
TAPE CLOTH SURG 6X10 WHT LF (GAUZE/BANDAGES/DRESSINGS) ×2 IMPLANT
TUBING CONNECTING 10 (TUBING) ×2 IMPLANT
WAND 90 DEG TURBOVAC W/CORD (SURGICAL WAND) ×2 IMPLANT

## 2013-03-25 NOTE — Preoperative (Signed)
Beta Blockers   Reason not to administer Beta Blockers:Not Applicable 

## 2013-03-25 NOTE — Transfer of Care (Signed)
Immediate Anesthesia Transfer of Care Note  Patient: Dan [name]  Procedure(s) Performed: Procedure(s): LEFT SHOULDER ARTHROSCOPY WITH EXTENDED DEBRIDEMENT AND SUBACROMIAL DECOMPRESSION (Left)  Patient Location: PACU  Anesthesia Type:General  Level of Consciousness: awake, alert , oriented and patient cooperative  Airway & Oxygen Therapy: Patient Spontanous Breathing and Patient connected to face mask oxygen  Post-op Assessment: Report given to PACU RN, Post -op Vital signs reviewed and stable and Patient moving all extremities  Post vital signs: Reviewed and stable  Complications: No apparent anesthesia complications
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
## 2013-03-25 NOTE — Op Note (Signed)
Dan [name], ZIRBES NO.:  [id number]  MEDICAL RECORD NO.:  [id number]  LOCATION:  1602                         FACILITY:  [facility]  PHYSICIAN:  Vanita Panda. Magnus Ivan, M.D.DATE OF BIRTH:  September 27, 1945  DATE OF PROCEDURE:  03/25/2013 DATE OF DISCHARGE:                              OPERATIVE REPORT   PREOPERATIVE DIAGNOSIS:  Left shoulder severe impingement syndrome with full-thickness rotator cuff tear.  POSTOPERATIVE DIAGNOSIS:  Left shoulder severe impingement syndrome with bursitis, rotator cuff tendinosis, and only partial rotator cuff tear.  PROCEDURE:  Left shoulder arthroscopy with extensive debridement and subacromial decompression.  SURGEON:  Vanita Panda. Magnus Ivan, M.D.  ASSISTING:  Richardean Canal, PA-C  ANESTHESIA: 1. General. 2. Local with 0.5% Marcaine mixed with epinephrine and morphine.  BLOOD LOSS:  Minimal.  COMPLICATIONS:  None.  INDICATIONS:  Mr. Fendley is a 67 year old gentleman who sometime last year before Christmas sustained a mechanical fall, injuring his left shoulder.  We saw him for sometime and did try to rehabilitate his shoulder and injections that did not work and MRI was obtained that showed a full-thickness retracted rotator cuff tear per the MRI.  Given these symptoms of pain, inflammation, impingement and weakness of the shoulder, we recommended arthroscopic intervention with the help to repair the rotator cuff.  The risks and benefits of surgery were explained to him and he had medical clearance for surgery as well.  PROCEDURE DESCRIPTION:  After informed consent was obtained and appropriate left shoulder was marked, he was brought to the operating room and placed supine on the operating table.  General anesthesia was obtained.  He was then fashioned in a beach-chair position with appropriate positioning of his head and neck and padding of the down on the operative right arm.  His left operative arm was placed in  in-line skeletal traction using 10 pounds of traction and the fishing pole traction device.  Left shoulder was prepped and draped also with DuraPrep and sterile drapes.  Time-out was called to identify correct patient, correct left shoulder.  I then made a posterior arthroscopy portal off the posterolateral edge of the acromion and entered the glenohumeral joint.  I found inflamed tissue throughout the glenohumeral joint and made an anterior portal in the rotator interval.  Using a soft tissue ablation wand, extensor debridement was carried out in the glenohumeral joint including partial tearing of the rotator cuff and the subscapularis tendon.  I then removed the instrumentation from the glenohumeral joint into the posterior portal into the subacromial space, made a separate lateral portal.  I found extensive bursitis, tendinosis and bone spurs hanging underneath the acromion in this area.  Using a soft tissue ablation wand and arthroscopic shaver, I carried out the significant debridement of the rotator cuff and removing bursitis and thickened tissue.  I then used a high-speed bur to perform a partial acromioplasty and co-planned into the distal clavicle and the acromion. I certainly felt the disk space was compressed.  I then assessed the rotator cuff and the shoulder, the humeral head did move as a unit with the rotator cuff intact.  It appears that there was probably some tearing and now was scarred  down so extensively.  I elected not to try to recreate a tear and mobilize the cuff, placed on what I was seeing. Both fluid will be irrigating to from a therapy standpoint as well and the surgery was held from a pain standpoint.  We then removed all instrumentation and closed all portal sites with interrupted nylon suture.  Xeroform and well-padded sterile dressings were applied and the shoulder was placed in a standard sling.  He was awakened, extubated, and taken to the recovery room  in stable condition.  All final counts were correct.  There were no complications noted.  Of note, Richardean Canal, PA-C assistance was crucial during the case especially for holding the arm as well as the camera during several parts of the case.     Vanita Panda. Magnus Ivan, M.D.     CYB/MEDQ  D:  03/25/2013  T:  03/25/2013  Job:  [id number]

## 2013-03-25 NOTE — Brief Op Note (Signed)
03/25/2013  4:21 PM  PATIENT:  Sherin Quarry  67 y.o. male  PRE-OPERATIVE DIAGNOSIS:  Left shoulder rotator cuff tear  POST-OPERATIVE DIAGNOSIS:  Left shoulder rotator cuff tear  PROCEDURE:  Procedure(s): LEFT SHOULDER ARTHROSCOPY WITH EXTENDED DEBRIDEMENT AND SUBACROMIAL DECOMPRESSION (Left)  SURGEON:  Surgeon(s) and Role:    * Kathryne Hitch, MD - Primary  PHYSICIAN ASSISTANT: Rexene Edison, PA-C  ANESTHESIA:   local and general  EBL:  Total I/O In: 1000 [I.V.:1000] Out: -   BLOOD ADMINISTERED:none  DRAINS: none   LOCAL MEDICATIONS USED:  MARCAINE     SPECIMEN:  No Specimen  DISPOSITION OF SPECIMEN:  N/A  COUNTS:  YES  TOURNIQUET:  * No tourniquets in log *  DICTATION: .Other Dictation: Dictation Number [id number]  PLAN OF CARE: Admit for overnight observation  PATIENT DISPOSITION:  PACU - hemodynamically stable.   Delay start of Pharmacological VTE agent (>24hrs) due to surgical blood loss or risk of bleeding: not applicable

## 2013-03-25 NOTE — Anesthesia Postprocedure Evaluation (Signed)
  Anesthesia Post-op Note  Patient: Dan [name]  Procedure(s) Performed: Procedure(s) (LRB): LEFT SHOULDER ARTHROSCOPY WITH EXTENDED DEBRIDEMENT AND SUBACROMIAL DECOMPRESSION (Left)  Patient Location: PACU  Anesthesia Type: General  Level of Consciousness: awake and alert   Airway and Oxygen Therapy: Patient Spontanous Breathing  Post-op Pain: mild  Post-op Assessment: Post-op Vital signs reviewed, Patient's Cardiovascular Status Stable, Respiratory Function Stable, Patent Airway and No signs of Nausea or vomiting  Last Vitals:  Filed Vitals:   03/25/13 1700  BP: 185/76  Pulse: 86  Temp:   Resp: 9    Post-op Vital Signs: stable   Complications: No apparent anesthesia complications

## 2013-03-25 NOTE — Anesthesia Preprocedure Evaluation (Addendum)
Anesthesia Evaluation  Patient identified by MRN, date of birth, ID band Patient awake    Reviewed: Allergy & Precautions, H&P , NPO status , Patient's Chart, lab work & pertinent test results  Airway       Dental  (+) Dental Advisory Given   Pulmonary pneumonia -, resolved, former smoker,          Cardiovascular hypertension, Pt. on medications     Neuro/Psych PSYCHIATRIC DISORDERS Anxiety Depression negative neurological ROS     GI/Hepatic Neg liver ROS, GERD-  Medicated,  Endo/Other  diabetes, Type 2, Oral Hypoglycemic AgentsHypothyroidism   Renal/GU negative Renal ROS     Musculoskeletal negative musculoskeletal ROS (+)   Abdominal (+) + obese,   Peds  Hematology negative hematology ROS (+)   Anesthesia Other Findings   Reproductive/Obstetrics                           Anesthesia Physical Anesthesia Plan  ASA: II  Anesthesia Plan: General   Post-op Pain Management:    Induction: Intravenous  Airway Management Planned: Oral ETT  Additional Equipment:   Intra-op Plan:   Post-operative Plan: Extubation in OR  Informed Consent: I have reviewed the patients History and Physical, chart, labs and discussed the procedure including the risks, benefits and alternatives for the proposed anesthesia with the patient or authorized representative who has indicated his/her understanding and acceptance.   Dental advisory given  Plan Discussed with: CRNA  Anesthesia Plan Comments:         Anesthesia Quick Evaluation

## 2013-03-26 ENCOUNTER — Other Ambulatory Visit: Payer: Self-pay | Admitting: Physician Assistant

## 2013-03-26 LAB — GLUCOSE, CAPILLARY

## 2013-03-26 NOTE — Progress Notes (Signed)
Patient discharged home, all discharge medications and instructions reviewed and questions answered.  Patient assisted to vehicle by wheelchair. 

## 2013-03-26 NOTE — Discharge Summary (Signed)
Physician Discharge Summary  Patient ID: Dan [name] MRN: [id number] DOB/AGE: 1946-01-29 67 y.o.  Admit date: 03/25/2013 Discharge date: 03/26/2013  Admission Diagnoses: Complete rotator cuff tear left shoulder  Discharge Diagnoses:  Principal Problem:   Complete rotator cuff rupture of left shoulder   Discharged Condition: stable  Hospital Course: Patient's hospital course was essentially unremarkable. He underwent rotator cuff reconstruction of left shoulder and was discharged to home in stable condition.  Consults: None  Significant Diagnostic Studies: labs: Routine labs  Treatments: surgery: See operative note  Discharge Exam: Blood pressure 162/78, pulse 94, temperature 98.4 F (36.9 C), temperature source Oral, resp. rate 16, height 5\' 11"  (1.803 m), weight 103.874 kg (229 lb), SpO2 95.00%. Incision/Wound: dressing clean dry and intact  Disposition: 01-Home or Self Care  Discharge Orders   Future Appointments Provider Department Dept Phone   06/07/2013 10:00 AM Ileana Ladd, MD [facility] [phone]   Future Orders Complete By Expires     Call MD / Call 911  As directed     Comments:      If you experience chest pain or shortness of breath, CALL 911 and be transported to the hospital emergency room.  If you develope a fever above 101 F, pus (white drainage) or increased drainage or redness at the wound, or calf pain, call your surgeon's office.    Call MD / Call 911  As directed     Comments:      If you experience chest pain or shortness of breath, CALL 911 and be transported to the hospital emergency room.  If you develope a fever above 101 F, pus (white drainage) or increased drainage or redness at the wound, or calf pain, call your surgeon's office.    Constipation Prevention  As directed     Comments:      Drink plenty of fluids.  Prune juice may be helpful.  You may use a stool softener, such as Colace (over the counter) 100 mg twice a  day.  Use MiraLax (over the counter) for constipation as needed.    Constipation Prevention  As directed     Comments:      Drink plenty of fluids.  Prune juice may be helpful.  You may use a stool softener, such as Colace (over the counter) 100 mg twice a day.  Use MiraLax (over the counter) for constipation as needed.    Diet - low sodium heart healthy  As directed     Diet - low sodium heart healthy  As directed     Discharge instructions  As directed     Comments:      You may remove your dressing 7/19 and shower and get your incisions wet in the shower daily. Place band-aids over your incisions daily. Ice your shoulder over the weekend for swelling. Increase your activities as comfort allows. Come out of your sling as comfort allows. No heavy lifting with your left arm.    Increase activity slowly as tolerated  As directed     Increase activity slowly as tolerated  As directed         Medication List    STOP taking these medications       Hydrocodone-Acetaminophen 5-300 MG Tabs      TAKE these medications       ALPRAZolam 0.25 MG tablet  Commonly known as:  XANAX  Take 0.25-0.5 mg by mouth 2 (two) times daily as needed for sleep or  anxiety.     amLODipine-benazepril 10-20 MG per capsule  Commonly known as:  LOTREL  Take 1 capsule by mouth every morning.     atorvastatin 40 MG tablet  Commonly known as:  LIPITOR  Take 40 mg by mouth daily.     CINNAMON PO  Take 1,000 mg by mouth daily.     cyclobenzaprine 10 MG tablet  Commonly known as:  FLEXERIL  Take 10 mg by mouth 3 (three) times daily as needed for muscle spasms.     Diabetic Insoles Misc  - by Does not apply route daily. Pt has Rx for diabetic shoes   -   -      furosemide 20 MG tablet  Commonly known as:  LASIX  Take 1 tablet (20 mg total) by mouth daily.     gabapentin 300 MG capsule  Commonly known as:  NEURONTIN  Take 1 capsule (300 mg total) by mouth 3 (three) times daily.     hydrOXYzine  25 MG tablet  Commonly known as:  ATARAX/VISTARIL  Take 25 mg by mouth at bedtime as needed (for sleep).     levothyroxine 200 MCG tablet  Commonly known as:  SYNTHROID, LEVOTHROID  Take 200 mcg by mouth daily before breakfast.     metFORMIN 1000 MG tablet  Commonly known as:  GLUCOPHAGE  Take 1 tablet (1,000 mg total) by mouth 2 (two) times daily with a meal.     multivitamin with minerals Tabs  Take 1 tablet by mouth daily.     ONE TOUCH ULTRA TEST test strip  Generic drug:  glucose blood     oxyCODONE-acetaminophen 5-325 MG per tablet  Commonly known as:  ROXICET  Take 1-2 tablets by mouth every 4 (four) hours as needed for pain.     oxyCODONE-acetaminophen 5-325 MG per tablet  Commonly known as:  ROXICET  Take 1-2 tablets by mouth every 4 (four) hours as needed for pain.     zolpidem 10 MG tablet  Commonly known as:  AMBIEN  Take 10 mg by mouth at bedtime.           Follow-up Information   Schedule an appointment as soon as possible for a visit with Kathryne Hitch, MD.   Contact information:   [address] [place] [address] [phone]       Signed: Nadara Mustard 03/26/2013, 8:43 AM

## 2013-03-28 ENCOUNTER — Encounter (HOSPITAL_COMMUNITY): Payer: Self-pay | Admitting: Orthopaedic Surgery

## 2013-03-29 NOTE — Telephone Encounter (Signed)
LAST RF 02/19/13. LAST OV 01/08/13. CALL IN [facility] IF APPROVED. MMM PATIENT. ROUTE TO NURSE TO CALL IN.

## 2013-03-29 NOTE — Telephone Encounter (Signed)
This is okay to refill 

## 2013-03-30 ENCOUNTER — Other Ambulatory Visit: Payer: Self-pay | Admitting: Physician Assistant

## 2013-05-20 ENCOUNTER — Encounter: Payer: Self-pay | Admitting: Internal Medicine

## 2013-05-30 ENCOUNTER — Other Ambulatory Visit: Payer: Self-pay | Admitting: Physician Assistant

## 2013-06-01 ENCOUNTER — Other Ambulatory Visit: Payer: Self-pay | Admitting: Physician Assistant

## 2013-06-01 NOTE — Telephone Encounter (Signed)
Please call in ambien rx with 1 refill 

## 2013-06-01 NOTE — Telephone Encounter (Signed)
LAST OV 01/08/13. LAST RF 04/30/13. CALL IN [facility].

## 2013-06-02 NOTE — Telephone Encounter (Signed)
Called in.

## 2013-06-07 ENCOUNTER — Ambulatory Visit: Payer: BC Managed Care – PPO | Admitting: Family Medicine

## 2013-06-14 ENCOUNTER — Ambulatory Visit: Payer: Medicare Other | Attending: Orthopaedic Surgery | Admitting: Physical Therapy

## 2013-06-14 DIAGNOSIS — R5381 Other malaise: Secondary | ICD-10-CM | POA: Insufficient documentation

## 2013-06-14 DIAGNOSIS — M25619 Stiffness of unspecified shoulder, not elsewhere classified: Secondary | ICD-10-CM | POA: Insufficient documentation

## 2013-06-14 DIAGNOSIS — IMO0001 Reserved for inherently not codable concepts without codable children: Secondary | ICD-10-CM | POA: Insufficient documentation

## 2013-06-14 DIAGNOSIS — M25519 Pain in unspecified shoulder: Secondary | ICD-10-CM | POA: Insufficient documentation

## 2013-06-17 ENCOUNTER — Ambulatory Visit: Payer: Medicare Other

## 2013-06-20 ENCOUNTER — Ambulatory Visit: Payer: Medicare Other | Admitting: *Deleted

## 2013-06-22 ENCOUNTER — Ambulatory Visit: Payer: Medicare Other

## 2013-06-24 ENCOUNTER — Ambulatory Visit: Payer: Medicare Other

## 2013-06-27 ENCOUNTER — Ambulatory Visit: Payer: Medicare Other | Admitting: Physical Therapy

## 2013-06-29 ENCOUNTER — Ambulatory Visit: Payer: Medicare Other | Admitting: Physical Therapy

## 2013-07-01 ENCOUNTER — Ambulatory Visit: Payer: Medicare Other | Admitting: *Deleted

## 2013-07-06 ENCOUNTER — Ambulatory Visit: Payer: Medicare Other | Admitting: Physical Therapy

## 2013-07-08 ENCOUNTER — Ambulatory Visit: Payer: Medicare Other | Admitting: Physical Therapy

## 2013-07-12 ENCOUNTER — Ambulatory Visit: Payer: Medicare Other | Attending: Orthopaedic Surgery | Admitting: *Deleted

## 2013-07-12 DIAGNOSIS — R5381 Other malaise: Secondary | ICD-10-CM | POA: Insufficient documentation

## 2013-07-12 DIAGNOSIS — M25519 Pain in unspecified shoulder: Secondary | ICD-10-CM | POA: Insufficient documentation

## 2013-07-12 DIAGNOSIS — M25619 Stiffness of unspecified shoulder, not elsewhere classified: Secondary | ICD-10-CM | POA: Insufficient documentation

## 2013-07-12 DIAGNOSIS — IMO0001 Reserved for inherently not codable concepts without codable children: Secondary | ICD-10-CM | POA: Insufficient documentation

## 2013-07-14 ENCOUNTER — Ambulatory Visit: Payer: Medicare Other | Admitting: *Deleted

## 2013-07-15 ENCOUNTER — Encounter: Payer: BC Managed Care – PPO | Admitting: Physical Therapy

## 2013-07-31 ENCOUNTER — Other Ambulatory Visit: Payer: Self-pay | Admitting: Nurse Practitioner

## 2013-08-01 NOTE — Telephone Encounter (Signed)
Last seen 01/08/13  MMM

## 2013-10-27 ENCOUNTER — Telehealth: Payer: Self-pay | Admitting: Internal Medicine

## 2013-10-27 NOTE — Telephone Encounter (Signed)
Patient reports that he is being treated for bladder cancer and had a CT scan with Dr. Jeffie Pollock that showed diverticulitis.  He has been treated for several weeks and his pain is not improving after 2 rounds of antibiotics.  He will come in and see Nicoletta Ba PA tomorrow at 2:00.  I have contacted Dr. Ralene Muskrat office for the copy of the CT scan.  Patient is asked to contact his primary care today and tell him his pain is worse until his appt here tomorrow

## 2013-10-28 ENCOUNTER — Encounter: Payer: Self-pay | Admitting: Physician Assistant

## 2013-10-28 ENCOUNTER — Ambulatory Visit (INDEPENDENT_AMBULATORY_CARE_PROVIDER_SITE_OTHER): Payer: Medicare Other | Admitting: Physician Assistant

## 2013-10-28 VITALS — BP 132/62 | HR 80 | Ht 70.75 in | Wt 222.4 lb

## 2013-10-28 DIAGNOSIS — Z86010 Personal history of colonic polyps: Secondary | ICD-10-CM

## 2013-10-28 DIAGNOSIS — Z860101 Personal history of adenomatous and serrated colon polyps: Secondary | ICD-10-CM

## 2013-10-28 DIAGNOSIS — K5732 Diverticulitis of large intestine without perforation or abscess without bleeding: Secondary | ICD-10-CM

## 2013-10-28 MED ORDER — AMOXICILLIN-POT CLAVULANATE 875-125 MG PO TABS: ORAL_TABLET | ORAL | Status: DC

## 2013-10-28 MED ORDER — NA SULFATE-K SULFATE-MG SULF 17.5-3.13-1.6 GM/177ML PO SOLN: ORAL | Status: DC

## 2013-10-28 MED ORDER — OXYCODONE-ACETAMINOPHEN 5-325 MG PO TABS: ORAL_TABLET | ORAL | Status: DC

## 2013-10-28 NOTE — Progress Notes (Signed)
CT report  IMPRESSION:Sigmoid colon diverticulosis with mild stranding in the pelvis.Findings could represent subtle acute or subacute diverticulitis. Noevidence for free fluid or free air. There are slightly prominentlymph nodes along the drainage of the the sigmoid colon which could be reactive in etiology. Recommend attention to these nodes onfollow up imaging. Normal appearance of both kidneys and no gross abnormality to theureters or bladder.Left adrenal adenoma and difficult to exclude a left adrenalmyelolipoma.Degenerative changes versus AVN in the right femoral head. Electronically SignedBy: Markus Daft M.D.On: 08/02/2013 12:16  Agree with Ms. Genia Harold assessment and plan. Gatha Mayer, MD, [facility]

## 2013-10-28 NOTE — Patient Instructions (Addendum)
You have been scheduled for a colonoscopy with propofol. Please follow written instructions given to you at your visit today.  Please pick up your prep kit at the pharmacy within the next 1-3 days. [facility].  If you use inhalers (even only as needed), please bring them with you on the day of your procedure. We sent a prescription for Augmentin also to [place]. We have given you the written prescription of Percocet ( Roxicet)  To take to your pharmacy.  If the pain has not resolved once completing the Augmentin please call our office at [phone].

## 2013-10-28 NOTE — Progress Notes (Signed)
Subjective:    Patient ID: Dan [name], male    DOB: 02/16/1946, 68 y.o.   MRN: [id number]  HPI Farmer is a 68 year old white male known to Dr. Carlean Purl. He has history of severe diverticular disease of the left colon and has had recurrent diverticulitis. He also has history of adenomatous colon polyps. Other medical problems include GERD diabetes mellitus BPH and hypothyroidism. He has degenerative disc disease with chronic back pain.  Last colonoscopy was done in February of 2011 with finding of the severe diverticulosis left colon and also had 2 polyps removed which were serrated adenomas and he was to have followup in 3 years.  Patient comes in now with complaints of 3-4 week history of ongoing left lower cautery pain which is very similar to his prior episodes of diverticulitis. He had been seen by urology apparently and had a CT scan done there per Dr. Jeffie Pollock was told that he had diverticulitis and was given a week's worth of Cipro.  his symptoms did not resolve and he was then seen by family practice in [place] and started on a course of Cipro and Flagyl about 9 days ago. Patient says he feels a little bit better but the pain is still not gone. He has not been having any fever or chills no problems with diarrhea melena or hematochezia his appetite has been fine and his weight has been stable. He has developed a rash on his lower legs which she was not sure the cause of however his allergy list has both Cipro and Flagyl with rash listed.   Review of Systems  Constitutional: Negative.   HENT: Negative.   Eyes: Negative.   Respiratory: Negative.   Cardiovascular: Negative.   Gastrointestinal: Positive for abdominal pain.  Endocrine: Negative.   Genitourinary: Negative.   Musculoskeletal: Positive for arthralgias and back pain.  Allergic/Immunologic: Negative.   Neurological: Negative.   Hematological: Negative.   Psychiatric/Behavioral: Negative.    Outpatient Prescriptions  Prior to Visit  Medication Sig Dispense Refill  . ALPRAZolam (XANAX) 0.25 MG tablet TAKE 2 TABLETS BY MOUTH 2 TIMES DAILY AS NEEDED  60 tablet  2  . amLODipine-benazepril (LOTREL) 10-20 MG per capsule Take 1 capsule by mouth every morning.      Marland Kitchen atorvastatin (LIPITOR) 40 MG tablet Take 40 mg by mouth daily.      Marland Kitchen CINNAMON PO Take 1,000 mg by mouth daily.      . cyclobenzaprine (FLEXERIL) 10 MG tablet TAKE 1 TABLET (10 MG TOTAL) BY MOUTH 3 (THREE) TIMES DAILY AS NEEDED FOR MUSCLE SPASMS.  90 tablet  0  . Foot Care Products (DIABETIC INSOLES) MISC by Does not apply route daily. Pt has Rx for diabetic shoes          . furosemide (LASIX) 20 MG tablet Take 1 tablet (20 mg total) by mouth daily.  30 tablet  5  . gabapentin (NEURONTIN) 300 MG capsule Take 1 capsule (300 mg total) by mouth 3 (three) times daily.  90 capsule  3  . hydrOXYzine (ATARAX/VISTARIL) 25 MG tablet Take 25 mg by mouth at bedtime as needed (for sleep).      Marland Kitchen levothyroxine (SYNTHROID, LEVOTHROID) 200 MCG tablet Take 200 mcg by mouth daily before breakfast.      . metFORMIN (GLUCOPHAGE) 1000 MG tablet Take 1 tablet (1,000 mg total) by mouth 2 (two) times daily with a meal.  60 tablet  0  . Multiple Vitamin (MULTIVITAMIN WITH MINERALS) TABS Take 1  tablet by mouth daily.      . ONE TOUCH ULTRA TEST test strip       . zolpidem (AMBIEN) 10 MG tablet TAKE 1 TABLET BY MOUTH AT BEDTIME  30 tablet  1  . oxyCODONE-acetaminophen (ROXICET) 5-325 MG per tablet Take 1-2 tablets by mouth every 4 (four) hours as needed for pain.  60 tablet  0  . cyclobenzaprine (FLEXERIL) 10 MG tablet Take 10 mg by mouth 3 (three) times daily as needed for muscle spasms.      Marland Kitchen oxyCODONE-acetaminophen (ROXICET) 5-325 MG per tablet Take 1-2 tablets by mouth every 4 (four) hours as needed for pain.  60 tablet  0   No facility-administered medications prior to visit.   Allergies  Allergen Reactions  . Floxin [Ofloxacin] Other (See Comments)    Central  Nervous System effects  . Flagyl [Metronidazole Hcl] Rash  . Sulfa Antibiotics Rash   Patient Active Problem List   Diagnosis Date Noted  . Complete rotator cuff rupture of left shoulder 03/25/2013  . GERD (gastroesophageal reflux disease) 12/12/2010  . Hypothyroidism 12/12/2010  . Hyperlipemia 12/12/2010  . Insomnia 12/12/2010  . ED (erectile dysfunction) 12/12/2010  . BPH (benign prostatic hyperplasia) 12/12/2010  . Depression with anxiety 12/12/2010  . Genital warts 12/12/2010  . Seborrhea 12/12/2010  . DM (diabetes mellitus) 12/12/2010  . DIVERTICULOSIS, COLON 11/10/2009  . DEGENERATIVE DISC DISEASE 11/10/2009  . COLONIC POLYPS, ADENOMATOUS, HX OF 11/10/2009  . OBESITY 11/06/2009   History  Substance Use Topics  . Smoking status: Former Smoker -- 3.00 packs/day for 15 years    Types: Cigarettes    Quit date: 06/09/1983  . Smokeless tobacco: Never Used  . Alcohol Use: No      family history includes Heart attack in his maternal grandfather; Parkinson's disease in his paternal grandfather; Rheum arthritis in his paternal grandfather.  Objective:   Physical Exam well-developed white male in no acute distress, blood pressure 132/62 pulse 80 height 5 foot 10 weight 222. HEENT; nontraumatic normocephalic EOMI PERRLA sclera anicteric, Supple; no JVD, Cardiovascular; regular rate and rhythm with S1-S2 no murmur or gallop, Pulmonary; clear bilaterally, Abdomen ;soft he is tender in the left mid quadrant left lower quadrant there is no guarding or rebound no palpable mass or hepatosplenomegaly bowel sounds are active, Rectal; exam not done, Extremities; he does have a fine macular rash bilaterally on his lower ext , Psych; mood and affect appropriate        Assessment & Plan:  #101  68 year old white male with recurrent diverticulitis now with partially treated recurrent diverticulitis improved but not resolved after a 9 day course of Cipro and Flagyl #2 lower chart a rash probably  drug rash questioned Cipro versus Flagyl #3 history of adenomatous colon polyps overdue for followup #4 diabetes mellitus #5 chronic back pain/degenerative disc disease #6 GERD #7 hypothyroidism Plan; Will switch antibiotics to Augmentin 875 mg by mouth twice daily x10 more days to be taken with food Patient asked for pain medication and will give him Percocet 5/325 one every 6 hours as needed for pain #40 and no refills We will schedule him for followup colonoscopy 3-4 weeks from now with Dr. Carlean Purl to allow him time to resolve the diverticulitis. Patient is advised that if his pain has not resolved when he finishes the antibiotics to call back for twice and at that time we may need to delay his procedure.

## 2013-11-01 ENCOUNTER — Encounter: Payer: Self-pay | Admitting: Internal Medicine

## 2013-11-07 ENCOUNTER — Telehealth: Payer: Self-pay | Admitting: Physician Assistant

## 2013-11-07 DIAGNOSIS — R1032 Left lower quadrant pain: Secondary | ICD-10-CM

## 2013-11-07 NOTE — Telephone Encounter (Signed)
Spoke with patient and he is not any better since OV with Nicoletta Ba, PA. Still having pain from left groin up to ribs. Taking pain medication. Completed Augmentin this AM. (this was his 3rd round of antibiotics from different MD's) He is asking if Nicoletta Ba, PA ever received the CT report from Dr. Jeffie Pollock. He also reports he had diarrhea x 3 days after OV. He took Imodium and it stopped. He has loose stools at times. Please, advise.

## 2013-11-08 NOTE — Telephone Encounter (Signed)
Dan [name]- pt is scheduled for Colon with you soon- not sure he still has diverticulitis... Was mild on Ct ..please review and advise

## 2013-11-11 NOTE — Telephone Encounter (Signed)
Last CT was in November 2014 - I reviewed report Please set up another CT abd/pelvis w/ IV and oral contrast (check creat etc) to evaluate persistent LLQ pain after diverticulitis Tx  I sent to [facility] also

## 2013-11-14 ENCOUNTER — Other Ambulatory Visit (INDEPENDENT_AMBULATORY_CARE_PROVIDER_SITE_OTHER): Payer: Medicare Other

## 2013-11-14 DIAGNOSIS — R1032 Left lower quadrant pain: Secondary | ICD-10-CM

## 2013-11-14 LAB — BUN: BUN: 17 mg/dL (ref 6–23)

## 2013-11-14 LAB — CREATININE, SERUM: Creatinine, Ser: 1.1 mg/dL (ref 0.4–1.5)

## 2013-11-14 NOTE — Telephone Encounter (Signed)
Patient is scheduled for CT 11/15/13 8:30.  He is advised to come for labs today and pick up his contrast and instructions.  He verbalized understanding of all written and verbal instructions.

## 2013-11-15 ENCOUNTER — Ambulatory Visit (INDEPENDENT_AMBULATORY_CARE_PROVIDER_SITE_OTHER)
Admission: RE | Admit: 2013-11-15 | Discharge: 2013-11-15 | Disposition: A | Discharge status: Home / Self Care | Payer: Medicare Other | Source: Ambulatory Visit | Attending: Internal Medicine | Admitting: Internal Medicine

## 2013-11-15 DIAGNOSIS — R1032 Left lower quadrant pain: Secondary | ICD-10-CM

## 2013-11-15 MED ORDER — IOHEXOL 300 MG/ML  SOLN
100.0000 mL | Freq: Once | INTRAMUSCULAR | Status: AC | PRN
  Administered 2013-11-15: 100 mL via INTRAVENOUS

## 2013-11-15 NOTE — Progress Notes (Signed)
Quick Note:  CT scan shows diverticulosis but not diverticulitis Proceed with colonoscopy ______

## 2013-11-16 ENCOUNTER — Other Ambulatory Visit: Payer: Self-pay

## 2013-11-16 ENCOUNTER — Other Ambulatory Visit: Payer: Self-pay | Admitting: Internal Medicine

## 2013-11-16 MED ORDER — OXYCODONE-ACETAMINOPHEN 5-325 MG PO TABS: ORAL_TABLET | ORAL | Status: DC

## 2013-11-16 NOTE — Progress Notes (Signed)
Quick Note:  We can refill the last percocet rx - I will sign tomorrow or perhaps Amy can sign today ______

## 2013-11-25 ENCOUNTER — Encounter: Payer: Self-pay | Admitting: Internal Medicine

## 2013-11-25 ENCOUNTER — Ambulatory Visit (AMBULATORY_SURGERY_CENTER): Payer: Medicare Other | Admitting: Internal Medicine

## 2013-11-25 VITALS — BP 153/87 | HR 69 | Temp 97.7°F | Resp 11 | Ht 70.0 in | Wt 222.0 lb

## 2013-11-25 DIAGNOSIS — K5730 Diverticulosis of large intestine without perforation or abscess without bleeding: Secondary | ICD-10-CM

## 2013-11-25 DIAGNOSIS — D126 Benign neoplasm of colon, unspecified: Secondary | ICD-10-CM

## 2013-11-25 DIAGNOSIS — Z86010 Personal history of colonic polyps: Secondary | ICD-10-CM

## 2013-11-25 DIAGNOSIS — K5732 Diverticulitis of large intestine without perforation or abscess without bleeding: Secondary | ICD-10-CM

## 2013-11-25 LAB — GLUCOSE, CAPILLARY
GLUCOSE-CAPILLARY: 107 mg/dL — AB (ref 70–99)
GLUCOSE-CAPILLARY: 125 mg/dL — AB (ref 70–99)

## 2013-11-25 NOTE — Progress Notes (Signed)
Called to room to assist during endoscopic procedure.  Patient ID and intended procedure confirmed with present staff. Received instructions for my participation in the procedure from the performing physician.  

## 2013-11-25 NOTE — Progress Notes (Signed)
Pt stable to RR 

## 2013-11-25 NOTE — Patient Instructions (Addendum)
I found and removed one tiny polyp that is benign, I think. You still have diverticulosis.  I am not certain what your pain is coming from but it is not diverticulitis and not colon cancer.  I think nerve damage (neuropathy) is most likely causing the pain - that can be difficult to treat.  There is a medication called duloxetine which helps that kind of pain and I recommend you ask your primary care provider about that to see if he thinks its worth a try.  I will let you know pathology results and when to have another routine colonoscopy by mail.  I appreciate the opportunity to care for you. Gatha Mayer, MD, [facility]  Discharge instructions given with verbal understanding. Handouts on polyps and diverticulosis. Resume previous medications. YOU HAD AN ENDOSCOPIC PROCEDURE TODAY AT [facility]: Refer to the procedure report that was given to you for any specific questions about what was found during the examination.  If the procedure report does not answer your questions, please call your gastroenterologist to clarify.  If you requested that your care partner not be given the details of your procedure findings, then the procedure report has been included in a sealed envelope for you to review at your convenience later.  YOU SHOULD EXPECT: Some feelings of bloating in the abdomen. Passage of more gas than usual.  Walking can help get rid of the air that was put into your GI tract during the procedure and reduce the bloating. If you had a lower endoscopy (such as a colonoscopy or flexible sigmoidoscopy) you may notice spotting of blood in your stool or on the toilet paper. If you underwent a bowel prep for your procedure, then you may not have a normal bowel movement for a few days.  DIET: Your first meal following the procedure should be a light meal and then it is ok to progress to your normal diet.  A half-sandwich or bowl of soup is an example of a good first meal.  Heavy or  fried foods are harder to digest and may make you feel nauseous or bloated.  Likewise meals heavy in dairy and vegetables can cause extra gas to form and this can also increase the bloating.  Drink plenty of fluids but you should avoid alcoholic beverages for 24 hours.  ACTIVITY: Your care partner should take you home directly after the procedure.  You should plan to take it easy, moving slowly for the rest of the day.  You can resume normal activity the day after the procedure however you should NOT DRIVE or use heavy machinery for 24 hours (because of the sedation medicines used during the test).    SYMPTOMS TO REPORT IMMEDIATELY: A gastroenterologist can be reached at any hour.  During normal business hours, 8:30 AM to 5:00 PM Monday through Friday, call [phone].  After hours and on weekends, please call the GI answering service at [phone] who will take a message and have the physician on call contact you.   Following lower endoscopy (colonoscopy or flexible sigmoidoscopy):  Excessive amounts of blood in the stool  Significant tenderness or worsening of abdominal pains  Swelling of the abdomen that is new, acute  Fever of 100F or higher FOLLOW UP: If any biopsies were taken you will be contacted by phone or by letter within the next 1-3 weeks.  Call your gastroenterologist if you have not heard about the biopsies in 3 weeks.  Our staff will call  the home number listed on your records the next business day following your procedure to check on you and address any questions or concerns that you may have at that time regarding the information given to you following your procedure. This is a courtesy call and so if there is no answer at the home number and we have not heard from you through the emergency physician on call, we will assume that you have returned to your regular daily activities without incident.  SIGNATURES/CONFIDENTIALITY: You and/or your care partner have signed  paperwork which will be entered into your electronic medical record.  These signatures attest to the fact that that the information above on your After Visit Summary has been reviewed and is understood.  Full responsibility of the confidentiality of this discharge information lies with you and/or your care-partner.

## 2013-11-25 NOTE — Op Note (Signed)
[place]  [address] [place], [id number]   COLONOSCOPY PROCEDURE REPORT  PATIENT: Severn, Goddard  MR#: [id number] BIRTHDATE: Jul 20, 1946 , 67  yrs. old GENDER: Male ENDOSCOPIST: Gatha Mayer, MD, [facility] PROCEDURE DATE:  11/25/2013 PROCEDURE:   Colonoscopy with snare polypectomy First Screening Colonoscopy - Avg.  risk and is 50 yrs.  old or older - No.  Prior Negative Screening - Now for repeat screening. N/A  History of Adenoma - Now for follow-up colonoscopy & has been > or = to 3 yrs.  Yes hx of adenoma.  Has been 3 or more years since last colonoscopy.  Polyps Removed Today? Yes. ASA CLASS:   Class II INDICATIONS:Patient's personal history of adenomatous colon polyps. Prior diverticulitis MEDICATIONS: propofol (Diprivan) 400mg  IV, MAC sedation, administered by CRNA, and These medications were titrated to patient response per physician's verbal order  DESCRIPTION OF PROCEDURE:   After the risks benefits and alternatives of the procedure were thoroughly explained, informed consent was obtained.  A digital rectal exam revealed no abnormalities of the rectum.   The LB [id number]  endoscope was introduced through the anus and advanced to the cecum, which was identified by both the appendix and ileocecal valve. No adverse events experienced.   The quality of the prep was Suprep adequate The instrument was then slowly withdrawn as the colon was fully examined.  COLON FINDINGS: A diminutive sessile polyp was found in the sigmoid colon.  A polypectomy was performed with a cold snare.  The resection was complete and the polyp tissue was completely retrieved.   Severe diverticulosis was noted The finding was in the left colon.   A right colon retroflexion was performed.   The colon mucosa was otherwise normal.  Retroflexed views revealed no abnormalities. The time to cecum=4 minutes 07 seconds.  Withdrawal time=16 minutes 54 seconds.  The scope was  withdrawn and the procedure completed. COMPLICATIONS: There were no complications.  ENDOSCOPIC IMPRESSION: 1.   Diminutive sessile polyp was found in the sigmoid colon; polypectomy was performed with a cold snare 2.   Severe diverticulosis was noted in the left colon 3.   The colon mucosa was otherwise normal - adequate prep - hx serrated polyps/adenomas  RECOMMENDATIONS: 1.  Timing of repeat colonoscopy will be determined by pathology findings. 2.   Ask PCP about using duloxetine to help with chronic pain.   eSigned:  Gatha Mayer, MD, [facility] 11/25/2013 3:32 PM   cc: [name], PA and The Patient   PATIENT NAME:  Tylek, Boney MR#: [id number]

## 2013-11-28 ENCOUNTER — Telehealth: Payer: Self-pay

## 2013-11-28 NOTE — Telephone Encounter (Signed)
Left message on answering machine. 

## 2013-12-02 ENCOUNTER — Encounter: Payer: Self-pay | Admitting: Internal Medicine

## 2013-12-02 NOTE — Progress Notes (Signed)
Quick Note:  Prolapse polyp - repeat colon 2022 ______

## 2014-02-27 ENCOUNTER — Telehealth: Payer: Self-pay | Admitting: Internal Medicine

## 2014-02-27 NOTE — Telephone Encounter (Signed)
Left message for Kem Parkinson in referrals to call back

## 2014-02-28 NOTE — Telephone Encounter (Signed)
Patient called to check the status of this referral/appointment. I saw an available appointment 03-08-14 and offered to Mr Lewallen. He will come in for that appoinment.

## 2014-03-08 ENCOUNTER — Ambulatory Visit: Payer: Medicare Other | Admitting: Internal Medicine

## 2014-03-22 ENCOUNTER — Encounter: Payer: Self-pay | Admitting: Physician Assistant

## 2014-03-22 ENCOUNTER — Ambulatory Visit (INDEPENDENT_AMBULATORY_CARE_PROVIDER_SITE_OTHER): Payer: Medicare Other | Admitting: Physician Assistant

## 2014-03-22 VITALS — BP 140/70 | HR 72 | Ht 70.0 in | Wt 219.0 lb

## 2014-03-22 DIAGNOSIS — K5732 Diverticulitis of large intestine without perforation or abscess without bleeding: Secondary | ICD-10-CM

## 2014-03-22 DIAGNOSIS — R1032 Left lower quadrant pain: Secondary | ICD-10-CM

## 2014-03-22 MED ORDER — AMOXICILLIN-POT CLAVULANATE 875-125 MG PO TABS: ORAL_TABLET | ORAL | Status: DC

## 2014-03-22 MED ORDER — OXYCODONE-ACETAMINOPHEN 5-325 MG PO TABS: 1.0000 | ORAL_TABLET | Freq: Four times a day (QID) | ORAL | Status: DC | PRN

## 2014-03-22 MED ORDER — ALIGN PO CAPS: 1.0000 | ORAL_CAPSULE | Freq: Every day | ORAL | Status: DC

## 2014-03-22 NOTE — Patient Instructions (Signed)
We sent prescriptions to [facility]. 1. Augmentin 2. Align probiotic- samples given. Take 1 cap daily.  3. We have printed the prescription for the Percocet to give to the pharmacist.  Call us back if your symptoms have not resolved.

## 2014-03-22 NOTE — Progress Notes (Signed)
Subjective:    Patient ID: Dan [name], male    DOB: 1946-02-11, 68 y.o.   MRN: [id number]  HPI  Dan [name] is a pleasant 68 year old white male known to Dr. Carlean [name] who has history of recurrent diverticulitis and adenomatous colon polyps. He was seen in February of 2015 with an episode of diverticulitis and was initially given Cipro and Flagyl but then developed a rash. He was switched to Augmentin and says that his symptoms did resolve after the course of Augmentin. However within a month or so of that he says his symptoms recurred and he's been having trouble intermittently ever since. He did get one other course of antibiotics from his PCP a couple of months ago. Patient had colonoscopy done in March of 2015 per Dr. Carlean [name] which showed severe diverticulosis in the left colon and one diminutive polyps Path  was consistent with a prolapse polyp. He comes back in today stating that he's been having left lower quadrant discomfort again over the past month which is persistent and achy in nature he has not had any fever or chills. His appetite has been fine his weight has been stable he has not noticed any changes in his bowel habits no melena or hematochezia. He also says that he's been having some back pain and occasional sharp shooting pain down his left leg.    Review of Systems  Constitutional: Negative.   HENT: Negative.   Eyes: Negative.   Respiratory: Negative.   Cardiovascular: Negative.   Gastrointestinal: Positive for abdominal pain.  Endocrine: Negative.   Genitourinary: Negative.   Musculoskeletal: Positive for arthralgias and back pain.  Skin: Negative.   Allergic/Immunologic: Negative.   Neurological: Negative.   Hematological: Negative.   Psychiatric/Behavioral: Negative.    Outpatient Prescriptions Prior to Visit  Medication Sig Dispense Refill  . ALPRAZolam (XANAX) 0.25 MG tablet TAKE 2 TABLETS BY MOUTH 2 TIMES DAILY AS NEEDED  60 tablet  2  . amitriptyline (ELAVIL) 50  MG tablet Take 1 tablet by mouth.      Marland Kitchen amLODipine-benazepril (LOTREL) 10-20 MG per capsule Take 1 capsule by mouth every morning.      Marland Kitchen aspirin 81 MG tablet Take 81 mg by mouth daily.      Marland Kitchen atorvastatin (LIPITOR) 40 MG tablet Take 40 mg by mouth daily.      Marland Kitchen CIALIS 5 MG tablet       . CINNAMON PO Take 1,000 mg by mouth daily.      . cyclobenzaprine (FLEXERIL) 10 MG tablet TAKE 1 TABLET (10 MG TOTAL) BY MOUTH 3 (THREE) TIMES DAILY AS NEEDED FOR MUSCLE SPASMS.  90 tablet  0  . Foot Care Products (DIABETIC INSOLES) MISC by Does not apply route daily. Pt has Rx for diabetic shoes          . furosemide (LASIX) 20 MG tablet Take 1 tablet (20 mg total) by mouth daily.  30 tablet  5  . gabapentin (NEURONTIN) 300 MG capsule Take 1 capsule (300 mg total) by mouth 3 (three) times daily.  90 capsule  3  . hydrOXYzine (ATARAX/VISTARIL) 25 MG tablet Take 25 mg by mouth at bedtime as needed (for sleep).      Marland Kitchen levothyroxine (SYNTHROID, LEVOTHROID) 200 MCG tablet Take 200 mcg by mouth daily before breakfast.      . metFORMIN (GLUCOPHAGE) 1000 MG tablet Take 1 tablet (1,000 mg total) by mouth 2 (two) times daily with a meal.  60 tablet  0  .  Multiple Vitamin (MULTIVITAMIN WITH MINERALS) TABS Take 1 tablet by mouth daily.      . ONE TOUCH ULTRA TEST test strip       . zolpidem (AMBIEN) 10 MG tablet TAKE 1 TABLET BY MOUTH AT BEDTIME  30 tablet  1  . oxyCODONE-acetaminophen (ROXICET) 5-325 MG per tablet Take 1 tab every 6 hours as needed for pain.  40 tablet  0   No facility-administered medications prior to visit.   Allergies  Allergen Reactions  . Floxin [Ofloxacin] Other (See Comments)    Central Nervous System effects  . Flagyl [Metronidazole Hcl] Rash  . Sulfa Antibiotics Rash   Patient Active Problem List   Diagnosis Date Noted  . Complete rotator cuff rupture of left shoulder 03/25/2013  . GERD (gastroesophageal reflux disease) 12/12/2010  . Hypothyroidism 12/12/2010  . Hyperlipemia  12/12/2010  . Insomnia 12/12/2010  . ED (erectile dysfunction) 12/12/2010  . BPH (benign prostatic hyperplasia) 12/12/2010  . Depression with anxiety 12/12/2010  . Genital warts 12/12/2010  . Seborrhea 12/12/2010  . DM (diabetes mellitus) 12/12/2010  . DIVERTICULOSIS, COLON 11/10/2009  . DEGENERATIVE DISC DISEASE 11/10/2009  . COLONIC POLYPS, ADENOMATOUS, HX OF 11/10/2009  . OBESITY 11/06/2009   History  Substance Use Topics  . Smoking status: Former Smoker -- 3.00 packs/day for 15 years    Types: Cigarettes    Quit date: 06/09/1983  . Smokeless tobacco: Never Used  . Alcohol Use: No      family history includes Bladder Cancer in his paternal uncle; Colon cancer in his paternal uncle; Heart attack in his maternal grandfather; Parkinson's disease in his paternal grandfather; Rheum arthritis in his paternal grandfather.  Objective:   Physical Exam  well-developed older white male in no acute distress. Blood pressure 140/70 pulse 72 height 5 foot 10 weight 219 . HEENT; nontraumatic normocephalic EOMI PERRLA sclera anicteric, Supple ;no JVD, Cardiovascular; regular rate and rhythm with S1-S2 no murmur or gallop, Pulmonary ;clear bilaterally, Abdomen ;obese soft he is tender in the left mid quadrant and left lower ordered is no guarding or rebound no palpable mass or hepatosplenomegaly bowel sounds are present, Rectal ;exam not done, Extremities; no clubbing cyanosis or edema skin warm and dry, Psych; mood and affect appropriate        Assessment & Plan:  #59 68 year old white male with recurrent diverticulitis now with recurrent left lower quadrant discomfort x1 month consistent with low-grade diverticulitis #2 history of adenomatous colon polyps last colonoscopy March 2015 no recurrent adenomatous polyps and followup advised in 2022 #3 obesity #4 diabetes mellitus #5 back pain and known degenerative disc disease  #6 hypothyroidism #7 hypertension  Plan; Start Augmentin 875 twice  daily, we'll plan a longer course of antibiotics over 3 weeks as he has had recurrent symptoms now for several months. Start a line one by mouth daily over the next 6 weeks Percocet 5/325 one every 6 hours as needed for pain #40 and no refills Discussed possible surgical resection with patient today and he states that he would like a surgical opinion especially if his symptoms do not resolve after this course of antibiotics. He is aware that he should call if his symptoms do not resolve after he finishes the antibiotics ,and/ or when they recur and at that point would treat and then refer to surgery

## 2014-03-23 NOTE — Progress Notes (Signed)
Agree with Ms. Esterwood's assessment and plan. Jazzma Neidhardt E. Raea Magallon, MD, FACG   

## 2014-04-10 ENCOUNTER — Telehealth: Payer: Self-pay | Admitting: Internal Medicine

## 2014-04-10 DIAGNOSIS — R109 Unspecified abdominal pain: Secondary | ICD-10-CM

## 2014-04-10 DIAGNOSIS — K5730 Diverticulosis of large intestine without perforation or abscess without bleeding: Secondary | ICD-10-CM

## 2014-04-10 NOTE — Telephone Encounter (Signed)
No answer, I will continue to try and reach the patient

## 2014-04-11 NOTE — Telephone Encounter (Signed)
Patient notified of surgical consult on 05/02/14 10:45 with his previous surgeon Dr. Ninfa Linden

## 2014-04-11 NOTE — Telephone Encounter (Signed)
Patient reports that he has continued pain.  He was taking oxycodone and Augmentin then had hives. He stopped both meds, because he did not know what caused the hives.  He started taking the oxycodone again yesterday for the abdominal pain and he has noticed one "welp" today.  He is advised to hold taking any additional oxycodone for now.  Per the last office note if pain did not resolve would need surgical referral.  I will set up that appointment.  Does he need any additional treatment until he sees the surgeon?

## 2014-04-11 NOTE — Telephone Encounter (Signed)
Patient notified of CT scan scheduled for 04/14/14 1:30 at [facility].  He verbalized understanding to come pick up oral contrast and instructions and to go the lab prior to Friday.

## 2014-04-11 NOTE — Telephone Encounter (Signed)
I think he should have a CT abd/pelvis to see if any active diverticulitis to account for his pain, and keep appt with surgery

## 2014-04-12 ENCOUNTER — Other Ambulatory Visit (INDEPENDENT_AMBULATORY_CARE_PROVIDER_SITE_OTHER): Payer: Medicare Other

## 2014-04-12 DIAGNOSIS — K5730 Diverticulosis of large intestine without perforation or abscess without bleeding: Secondary | ICD-10-CM

## 2014-04-12 DIAGNOSIS — R109 Unspecified abdominal pain: Secondary | ICD-10-CM

## 2014-04-12 LAB — BUN: BUN: 15 mg/dL (ref 6–23)

## 2014-04-12 LAB — CREATININE, SERUM: CREATININE: 0.9 mg/dL (ref 0.4–1.5)

## 2014-04-14 ENCOUNTER — Ambulatory Visit (INDEPENDENT_AMBULATORY_CARE_PROVIDER_SITE_OTHER)
Admission: RE | Admit: 2014-04-14 | Discharge: 2014-04-14 | Disposition: A | Discharge status: Home / Self Care | Payer: Medicare Other | Source: Ambulatory Visit | Attending: Physician Assistant | Admitting: Physician Assistant

## 2014-04-14 DIAGNOSIS — K5730 Diverticulosis of large intestine without perforation or abscess without bleeding: Secondary | ICD-10-CM

## 2014-04-14 DIAGNOSIS — R109 Unspecified abdominal pain: Secondary | ICD-10-CM

## 2014-04-14 MED ORDER — IOHEXOL 300 MG/ML  SOLN
100.0000 mL | Freq: Once | INTRAMUSCULAR | Status: AC | PRN
  Administered 2014-04-14: 100 mL via INTRAVENOUS

## 2014-04-27 ENCOUNTER — Other Ambulatory Visit: Payer: Self-pay | Admitting: Orthopaedic Surgery

## 2014-04-27 DIAGNOSIS — M545 Low back pain, unspecified: Secondary | ICD-10-CM

## 2014-05-01 ENCOUNTER — Ambulatory Visit
Admission: RE | Admit: 2014-05-01 | Discharge: 2014-05-01 | Disposition: A | Discharge status: Home / Self Care | Payer: 59 | Source: Ambulatory Visit | Attending: Orthopaedic Surgery | Admitting: Orthopaedic Surgery

## 2014-05-01 DIAGNOSIS — M545 Low back pain, unspecified: Secondary | ICD-10-CM

## 2014-05-02 ENCOUNTER — Ambulatory Visit (INDEPENDENT_AMBULATORY_CARE_PROVIDER_SITE_OTHER): Payer: Medicare Other | Admitting: Surgery

## 2014-05-02 ENCOUNTER — Encounter (INDEPENDENT_AMBULATORY_CARE_PROVIDER_SITE_OTHER): Payer: Self-pay | Admitting: Surgery

## 2014-05-02 VITALS — BP 140/70 | HR 85 | Temp 97.7°F | Ht 71.0 in | Wt 214.2 lb

## 2014-05-02 DIAGNOSIS — K5732 Diverticulitis of large intestine without perforation or abscess without bleeding: Secondary | ICD-10-CM

## 2014-05-02 MED ORDER — NEOMYCIN SULFATE 500 MG PO TABS: 500.0000 mg | ORAL_TABLET | Freq: Four times a day (QID) | ORAL | Status: AC

## 2014-05-02 MED ORDER — METRONIDAZOLE 500 MG PO TABS: 500.0000 mg | ORAL_TABLET | Freq: Three times a day (TID) | ORAL | Status: DC
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
## 2014-05-02 NOTE — Patient Instructions (Signed)
Our schedulers will call you back to schedule surgery  COLON BOWEL PREP                                                                          Please follow the instructions carefully. It is important to clean out your bowels & take the prescribed antibiotic pills to lower your chances of a wound infection or abscess.   FIVE DAYS PRIOR TO YOUR SURGERY  Stop eating any nuts, popcorn, or fruit with seeds. Stop all fiber supplements such as Metamucil, Citrucel, etc.   Hold taking any blood thinning anticoagulation medication (ex: aspirin, warfarin/Coumadin, Plavix, Xarelto, Eliquis, Pradaxa, etc) as recommended by your medical/cardiology doctor  Obtain what you need at a pharmacy of your choice: -Filled out prescriptions for your oral antibiotics (Neomycin & Metronidazole)  -A bottle of MiraLax / Glycolax (288g) - no prescription required  -A large bottle of Gatorade / Powerade (64oz)  -Dulcolax tablets (4 tabs) - no prescription required    DAY PRIOR TO SURGERY   7:00am Swallow 4 Dulcolax tablets with some water Drink plenty of clear liquids all day to avoid getting dehydrated (Water, juice, soda, coffee, tea, bouillon, jello, etc.)  10:00am Mix the bottle of MiraLax with the 64-oz bottle of Gatorade.  Drink the Gatorade mixture gradually over the next few hours (8oz glass every 15-30 minutes) until gone. You should finish by 2pm.  2:00pm Take 2 Neomycin 500mg  tablets & 2 Metronidazole 500mg  tablets  3:00pm Take 2 Neomycin 500mg  tablets & 2 Metronidazole 500mg  tablets  Drink plenty of clear liquids all evening to avoid getting dehydrated  10:00pm Take 2 Neomycin 500mg  tablets & 2 Metronidazole 500mg  tablets  Do not eat or drink anything after bedtime (midnight) the night before your surgery.   MORNING OF SURGERY Remember to not to drink or eat anything that morning  Hold or take medications as recommended by the hospital staff at your Preoperative visit  If you have  questions or concerns, please call [facility] [phone] during business hours to speak to the clinical staff for advice.

## 2014-05-05 ENCOUNTER — Telehealth (INDEPENDENT_AMBULATORY_CARE_PROVIDER_SITE_OTHER): Payer: Self-pay

## 2014-05-05 ENCOUNTER — Encounter (HOSPITAL_COMMUNITY): Payer: Self-pay | Admitting: Emergency Medicine

## 2014-05-05 ENCOUNTER — Emergency Department (HOSPITAL_COMMUNITY): Payer: Medicare Other

## 2014-05-05 ENCOUNTER — Emergency Department (HOSPITAL_COMMUNITY)
Admission: EM | Admit: 2014-05-05 | Discharge: 2014-05-06 | Disposition: A | Discharge status: Home / Self Care | Payer: Medicare Other | Attending: Emergency Medicine | Admitting: Emergency Medicine

## 2014-05-05 DIAGNOSIS — E119 Type 2 diabetes mellitus without complications: Secondary | ICD-10-CM | POA: Diagnosis not present

## 2014-05-05 DIAGNOSIS — R109 Unspecified abdominal pain: Secondary | ICD-10-CM | POA: Diagnosis present

## 2014-05-05 DIAGNOSIS — Z87891 Personal history of nicotine dependence: Secondary | ICD-10-CM | POA: Diagnosis not present

## 2014-05-05 DIAGNOSIS — I10 Essential (primary) hypertension: Secondary | ICD-10-CM | POA: Diagnosis not present

## 2014-05-05 DIAGNOSIS — K5732 Diverticulitis of large intestine without perforation or abscess without bleeding: Secondary | ICD-10-CM | POA: Insufficient documentation

## 2014-05-05 DIAGNOSIS — K5792 Diverticulitis of intestine, part unspecified, without perforation or abscess without bleeding: Secondary | ICD-10-CM

## 2014-05-05 DIAGNOSIS — Z9089 Acquired absence of other organs: Secondary | ICD-10-CM | POA: Insufficient documentation

## 2014-05-05 DIAGNOSIS — R300 Dysuria: Secondary | ICD-10-CM | POA: Diagnosis not present

## 2014-05-05 DIAGNOSIS — F411 Generalized anxiety disorder: Secondary | ICD-10-CM | POA: Diagnosis not present

## 2014-05-05 DIAGNOSIS — E039 Hypothyroidism, unspecified: Secondary | ICD-10-CM | POA: Diagnosis not present

## 2014-05-05 DIAGNOSIS — Z7901 Long term (current) use of anticoagulants: Secondary | ICD-10-CM | POA: Insufficient documentation

## 2014-05-05 DIAGNOSIS — R110 Nausea: Secondary | ICD-10-CM | POA: Insufficient documentation

## 2014-05-05 DIAGNOSIS — Z792 Long term (current) use of antibiotics: Secondary | ICD-10-CM | POA: Insufficient documentation

## 2014-05-05 DIAGNOSIS — Z8551 Personal history of malignant neoplasm of bladder: Secondary | ICD-10-CM | POA: Diagnosis not present

## 2014-05-05 DIAGNOSIS — Z860100 Personal history of colon polyps, unspecified: Secondary | ICD-10-CM | POA: Insufficient documentation

## 2014-05-05 DIAGNOSIS — E785 Hyperlipidemia, unspecified: Secondary | ICD-10-CM | POA: Diagnosis not present

## 2014-05-05 DIAGNOSIS — Z7982 Long term (current) use of aspirin: Secondary | ICD-10-CM | POA: Insufficient documentation

## 2014-05-05 DIAGNOSIS — Z79899 Other long term (current) drug therapy: Secondary | ICD-10-CM | POA: Diagnosis not present

## 2014-05-05 LAB — COMPREHENSIVE METABOLIC PANEL
ALK PHOS: 152 U/L — AB (ref 39–117)
ALT: 27 U/L (ref 0–53)
AST: 20 U/L (ref 0–37)
Albumin: 4.3 g/dL (ref 3.5–5.2)
Anion gap: 15 (ref 5–15)
BUN: 21 mg/dL (ref 6–23)
CO2: 23 mEq/L (ref 19–32)
Calcium: 10.3 mg/dL (ref 8.4–10.5)
Chloride: 100 mEq/L (ref 96–112)
Creatinine, Ser: 0.88 mg/dL (ref 0.50–1.35)
GFR, EST NON AFRICAN AMERICAN: 88 mL/min — AB (ref >90)
GLUCOSE: 111 mg/dL — AB (ref 70–99)
POTASSIUM: 4.4 meq/L (ref 3.7–5.3)
SODIUM: 138 meq/L (ref 137–147)
Total Bilirubin: 0.5 mg/dL (ref 0.3–1.2)
Total Protein: 7.8 g/dL (ref 6.0–8.3)

## 2014-05-05 LAB — URINALYSIS, ROUTINE W REFLEX MICROSCOPIC
BILIRUBIN URINE: NEGATIVE
Glucose, UA: NEGATIVE mg/dL
HGB URINE DIPSTICK: NEGATIVE
Ketones, ur: NEGATIVE mg/dL
Leukocytes, UA: NEGATIVE
Nitrite: NEGATIVE
PROTEIN: NEGATIVE mg/dL
Specific Gravity, Urine: 1.015 (ref 1.005–1.030)
UROBILINOGEN UA: 0.2 mg/dL (ref 0.0–1.0)
pH: 5 (ref 5.0–8.0)

## 2014-05-05 LAB — CBC WITH DIFFERENTIAL/PLATELET
Basophils Absolute: 0 10*3/uL (ref 0.0–0.1)
Basophils Relative: 0 % (ref 0–1)
Eosinophils Absolute: 0.3 10*3/uL (ref 0.0–0.7)
Eosinophils Relative: 3 % (ref 0–5)
HCT: 45.8 % (ref 39.0–52.0)
HEMOGLOBIN: 16.5 g/dL (ref 13.0–17.0)
LYMPHS PCT: 30 % (ref 12–46)
Lymphs Abs: 3.4 10*3/uL (ref 0.7–4.0)
MCH: 31.4 pg (ref 26.0–34.0)
MCHC: 36 g/dL (ref 30.0–36.0)
MCV: 87.1 fL (ref 78.0–100.0)
MONOS PCT: 7 % (ref 3–12)
Monocytes Absolute: 0.7 10*3/uL (ref 0.1–1.0)
Neutro Abs: 6.7 10*3/uL (ref 1.7–7.7)
Neutrophils Relative %: 60 % (ref 43–77)
PLATELETS: 212 10*3/uL (ref 150–400)
RBC: 5.26 MIL/uL (ref 4.22–5.81)
RDW: 12.4 % (ref 11.5–15.5)
WBC: 11.1 10*3/uL — AB (ref 4.0–10.5)

## 2014-05-05 MED ORDER — IOHEXOL 300 MG/ML  SOLN
100.0000 mL | Freq: Once | INTRAMUSCULAR | Status: AC | PRN
  Administered 2014-05-05: 100 mL via INTRAVENOUS

## 2014-05-05 MED ORDER — ONDANSETRON HCL 4 MG/2ML IJ SOLN
4.0000 mg | Freq: Once | INTRAMUSCULAR | Status: AC
  Administered 2014-05-05: 4 mg via INTRAVENOUS
  Filled 2014-05-05: qty 2

## 2014-05-05 MED ORDER — MORPHINE SULFATE 4 MG/ML IJ SOLN
4.0000 mg | Freq: Once | INTRAMUSCULAR | Status: AC
  Administered 2014-05-05: 4 mg via INTRAVENOUS
  Filled 2014-05-05: qty 1

## 2014-05-05 NOTE — Telephone Encounter (Signed)
Pt called stating he has increasing abd pain in lower abd and nausea. Pain has increased today to a 7. No fever. Pt reviewed with Dr Rosendo Gros. Per Dr Rosendo Gros pt to go to [facility] and have PA or MD on call eval and do CT. Pt advised and will go now. Will our PA advised.

## 2014-05-05 NOTE — ED Notes (Signed)
Received pt to room from lobby,  Pt states he is having left lower quadrant pain, pt is alert and oriented in NAD, pt has been waiting in lobby for quite some time

## 2014-05-05 NOTE — ED Notes (Signed)
Pt states that he is having right sided pain. Pt states that he believes he is having colon pain. Pt is to have surgery soon but is unable to explain what he is having surgery for. Hx of diverticulosis and colon polyps. He is unable to tell me if either of these problems relate to what he is going to have surgery for. Pain is localized to his right side and is described as tightness. Last BM was 8/28. Pt states he has burning when he urinates. Pt is stable and in no acute distress.
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
## 2014-05-06 MED ORDER — OXYCODONE-ACETAMINOPHEN 5-325 MG PO TABS: 2.0000 | ORAL_TABLET | Freq: Four times a day (QID) | ORAL | Status: DC | PRN

## 2014-05-06 MED ORDER — ONDANSETRON HCL 4 MG PO TABS: 4.0000 mg | ORAL_TABLET | Freq: Four times a day (QID) | ORAL | Status: DC

## 2014-05-06 MED ORDER — ONDANSETRON HCL 4 MG/2ML IJ SOLN: 4.0000 mg | Freq: Once | INTRAMUSCULAR | Status: DC

## 2014-05-06 MED ORDER — AMOXICILLIN-POT CLAVULANATE 875-125 MG PO TABS: 1.0000 | ORAL_TABLET | Freq: Two times a day (BID) | ORAL | Status: DC

## 2014-05-06 NOTE — Discharge Instructions (Signed)

## 2014-05-06 NOTE — ED Provider Notes (Signed)
67 year old male, history of recurrent diverticulitis, the patient states he has had 5 or 6 episodes this year who is followed up recently with general surgery. He states they want to do a sigmoid or colon resection to prevent this from recurring. This has not scheduled. He has run out of his pain medication at home and presents with left lower quadrant pain. On my exam he is tenderness to left lower quadrant but no guarding or peritoneal signs, heart and lung exams are normal, no peripheral edema, he'll otherwise appears well. Show mild acidosis his CT scan confirms early diverticulitis. These findings were discussed with the patient as well as the plan of pain control antibiotics and followup in the outpatient setting. The patient states that he is very comfortable at this point. He does not a surgical abdomen and is not in need of admission to the hospital at this time.  Medical screening examination/treatment/procedure(s) were conducted as a shared visit with non-physician practitioner(s) and myself.  I personally evaluated the patient during the encounter.  Clinical Impression: Diverticulitis      Johnna Acosta, MD 05/06/14 [phone]

## 2014-05-06 NOTE — ED Provider Notes (Signed)
Medical screening examination/treatment/procedure(s) were performed by non-physician practitioner and as supervising physician I was immediately available for consultation/collaboration.   EKG Interpretation None        Delice Bison Artisha Capri, DO 05/06/14 5009

## 2014-05-08 ENCOUNTER — Emergency Department (HOSPITAL_COMMUNITY)
Admission: EM | Admit: 2014-05-08 | Discharge: 2014-05-08 | Disposition: A | Discharge status: Home / Self Care | Payer: Medicare Other | Attending: Emergency Medicine | Admitting: Emergency Medicine

## 2014-05-08 ENCOUNTER — Telehealth (INDEPENDENT_AMBULATORY_CARE_PROVIDER_SITE_OTHER): Payer: Self-pay

## 2014-05-08 ENCOUNTER — Encounter (HOSPITAL_COMMUNITY): Payer: Self-pay | Admitting: Emergency Medicine

## 2014-05-08 DIAGNOSIS — Z8551 Personal history of malignant neoplasm of bladder: Secondary | ICD-10-CM | POA: Diagnosis not present

## 2014-05-08 DIAGNOSIS — R109 Unspecified abdominal pain: Secondary | ICD-10-CM

## 2014-05-08 DIAGNOSIS — E039 Hypothyroidism, unspecified: Secondary | ICD-10-CM | POA: Insufficient documentation

## 2014-05-08 DIAGNOSIS — K5792 Diverticulitis of intestine, part unspecified, without perforation or abscess without bleeding: Secondary | ICD-10-CM

## 2014-05-08 DIAGNOSIS — K5732 Diverticulitis of large intestine without perforation or abscess without bleeding: Secondary | ICD-10-CM | POA: Diagnosis not present

## 2014-05-08 DIAGNOSIS — Z792 Long term (current) use of antibiotics: Secondary | ICD-10-CM | POA: Insufficient documentation

## 2014-05-08 DIAGNOSIS — Z87891 Personal history of nicotine dependence: Secondary | ICD-10-CM | POA: Insufficient documentation

## 2014-05-08 DIAGNOSIS — E119 Type 2 diabetes mellitus without complications: Secondary | ICD-10-CM | POA: Diagnosis not present

## 2014-05-08 DIAGNOSIS — Z860100 Personal history of colon polyps, unspecified: Secondary | ICD-10-CM | POA: Insufficient documentation

## 2014-05-08 DIAGNOSIS — E785 Hyperlipidemia, unspecified: Secondary | ICD-10-CM | POA: Insufficient documentation

## 2014-05-08 DIAGNOSIS — Z8701 Personal history of pneumonia (recurrent): Secondary | ICD-10-CM | POA: Diagnosis not present

## 2014-05-08 DIAGNOSIS — Z7982 Long term (current) use of aspirin: Secondary | ICD-10-CM | POA: Diagnosis not present

## 2014-05-08 DIAGNOSIS — I10 Essential (primary) hypertension: Secondary | ICD-10-CM | POA: Diagnosis not present

## 2014-05-08 DIAGNOSIS — Z79899 Other long term (current) drug therapy: Secondary | ICD-10-CM | POA: Insufficient documentation

## 2014-05-08 DIAGNOSIS — F411 Generalized anxiety disorder: Secondary | ICD-10-CM | POA: Insufficient documentation

## 2014-05-08 LAB — CBC WITH DIFFERENTIAL/PLATELET
BASOS PCT: 0 % (ref 0–1)
Basophils Absolute: 0 10*3/uL (ref 0.0–0.1)
EOS ABS: 0.3 10*3/uL (ref 0.0–0.7)
Eosinophils Relative: 3 % (ref 0–5)
HCT: 42.6 % (ref 39.0–52.0)
Hemoglobin: 15 g/dL (ref 13.0–17.0)
Lymphocytes Relative: 25 % (ref 12–46)
Lymphs Abs: 2 10*3/uL (ref 0.7–4.0)
MCH: 30.3 pg (ref 26.0–34.0)
MCHC: 35.2 g/dL (ref 30.0–36.0)
MCV: 86.1 fL (ref 78.0–100.0)
Monocytes Absolute: 0.6 10*3/uL (ref 0.1–1.0)
Monocytes Relative: 7 % (ref 3–12)
Neutro Abs: 5.4 10*3/uL (ref 1.7–7.7)
Neutrophils Relative %: 65 % (ref 43–77)
PLATELETS: 192 10*3/uL (ref 150–400)
RBC: 4.95 MIL/uL (ref 4.22–5.81)
RDW: 12.1 % (ref 11.5–15.5)
WBC: 8.2 10*3/uL (ref 4.0–10.5)

## 2014-05-08 LAB — COMPREHENSIVE METABOLIC PANEL
ALT: 26 U/L (ref 0–53)
AST: 22 U/L (ref 0–37)
Albumin: 4.1 g/dL (ref 3.5–5.2)
Alkaline Phosphatase: 135 U/L — ABNORMAL HIGH (ref 39–117)
Anion gap: 13 (ref 5–15)
BUN: 15 mg/dL (ref 6–23)
CO2: 27 mEq/L (ref 19–32)
CREATININE: 0.93 mg/dL (ref 0.50–1.35)
Calcium: 9.4 mg/dL (ref 8.4–10.5)
Chloride: 99 mEq/L (ref 96–112)
GFR calc Af Amer: >90 mL/min (ref >90)
GFR calc non Af Amer: 86 mL/min — ABNORMAL LOW (ref >90)
Glucose, Bld: 124 mg/dL — ABNORMAL HIGH (ref 70–99)
Potassium: 4.4 mEq/L (ref 3.7–5.3)
SODIUM: 139 meq/L (ref 137–147)
TOTAL PROTEIN: 7.4 g/dL (ref 6.0–8.3)
Total Bilirubin: 0.7 mg/dL (ref 0.3–1.2)

## 2014-05-08 LAB — LIPASE, BLOOD: LIPASE: 95 U/L — AB (ref 11–59)

## 2014-05-08 MED ORDER — ONDANSETRON HCL 4 MG/2ML IJ SOLN
4.0000 mg | Freq: Once | INTRAMUSCULAR | Status: AC
  Administered 2014-05-08: 4 mg via INTRAVENOUS
  Filled 2014-05-08: qty 2

## 2014-05-08 MED ORDER — OXYCODONE-ACETAMINOPHEN 5-325 MG PO TABS: 1.0000 | ORAL_TABLET | ORAL | Status: DC | PRN

## 2014-05-08 MED ORDER — MORPHINE SULFATE 4 MG/ML IJ SOLN
4.0000 mg | Freq: Once | INTRAMUSCULAR | Status: AC
  Administered 2014-05-08: 4 mg via INTRAVENOUS
  Filled 2014-05-08: qty 1

## 2014-05-08 MED ORDER — AMOXICILLIN-POT CLAVULANATE 875-125 MG PO TABS: 1.0000 | ORAL_TABLET | Freq: Two times a day (BID) | ORAL | Status: DC

## 2014-05-08 MED ORDER — SODIUM CHLORIDE 0.9 % IV BOLUS (SEPSIS)
500.0000 mL | Freq: Once | INTRAVENOUS | Status: AC
  Administered 2014-05-08: 500 mL via INTRAVENOUS
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
## 2014-05-08 NOTE — Discharge Instructions (Signed)

## 2014-05-08 NOTE — Telephone Encounter (Signed)
Pt called stating he went to ER yesterday due to increasing abd pain. Pt states his pain is becoming more intense. No vomiting. No fever. Last BM yesterday. Reviewed with Dr Ninfa Linden. Per Dr Ninfa Linden pt advised to go to [facility]. Pt agrees with this plan. Pt will stay NPO until seen by PA at ER.  PA notified of pt coming to ER.

## 2014-05-08 NOTE — ED Notes (Signed)
Pt c/o lower abd pain and nausea due to diverticulitis.  Pt believes that he needs part of his colon removed.  Pt states has some trouble with constipation.
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
## 2014-05-10 ENCOUNTER — Ambulatory Visit (HOSPITAL_COMMUNITY)
Admission: RE | Admit: 2014-05-10 | Discharge: 2014-05-10 | Disposition: A | Discharge status: Home / Self Care | Payer: Medicare Other | Source: Ambulatory Visit | Attending: Surgery | Admitting: Surgery

## 2014-05-10 ENCOUNTER — Encounter (HOSPITAL_COMMUNITY)
Admission: RE | Admit: 2014-05-10 | Discharge: 2014-05-10 | Disposition: A | Discharge status: Home / Self Care | Payer: Medicare Other | Source: Ambulatory Visit | Attending: Surgery | Admitting: Surgery

## 2014-05-10 ENCOUNTER — Encounter (HOSPITAL_COMMUNITY): Payer: Self-pay

## 2014-05-10 DIAGNOSIS — I10 Essential (primary) hypertension: Secondary | ICD-10-CM | POA: Insufficient documentation

## 2014-05-10 LAB — HEMOGLOBIN A1C
Hgb A1c MFr Bld: 6.6 % — ABNORMAL HIGH (ref <5.7)
Mean Plasma Glucose: 143 mg/dL — ABNORMAL HIGH (ref <117)

## 2014-05-10 LAB — TYPE AND SCREEN
ABO/RH(D): O POS
ANTIBODY SCREEN: NEGATIVE

## 2014-05-10 LAB — ABO/RH: ABO/RH(D): O POS

## 2014-05-10 MED ORDER — CHLORHEXIDINE GLUCONATE CLOTH 2 % EX PADS: 6.0000 | MEDICATED_PAD | Freq: Once | CUTANEOUS | Status: DC

## 2014-05-10 NOTE — Pre-Procedure Instructions (Signed)
DAGMAWI VENABLE  05/10/2014   Your procedure is scheduled on:  05/19/14  Report to [facility] Admitting at 9 AM.  Call this number if you have problems the morning of surgery: [phone]   Remember:   Do not eat food or drink liquids after midnight.   Take these medicines the morning of surgery with A SIP OF WATER: xanax,augmentin,synthroid,oxycodone,neurontin   Do not wear jewelry, make-up or nail polish.  Do not wear lotions, powders, or perfumes. You may wear deodorant.  Do not shave 48 hours prior to surgery. Men may shave face and neck.  Do not bring valuables to the hospital.  [facility] is not responsible                  for any belongings or valuables.               Contacts, dentures or bridgework may not be worn into surgery.  Leave suitcase in the car. After surgery it may be brought to your room.  For patients admitted to the hospital, discharge time is determined by your                treatment team.               Patients discharged the day of surgery will not be allowed to drive  home.  Name and phone number of your driver:   Special Instructions: Shower using CHG 2 nights before surgery and the night before surgery.  If you shower the day of surgery use CHG.  Use special wash - you have one bottle of CHG for all showers.  You should use approximately 1/3 of the bottle for each shower.   Please read over the following fact sheets that you were given: Pain Booklet, Coughing and Deep Breathing, Blood Transfusion Information and Surgical Site Infection Prevention

## 2014-05-18 MED ORDER — ALVIMOPAN 12 MG PO CAPS
12.0000 mg | ORAL_CAPSULE | Freq: Once | ORAL | Status: AC
  Administered 2014-05-19: 12 mg via ORAL
  Filled 2014-05-18 (×2): qty 1

## 2014-05-18 MED ORDER — DEXTROSE 5 % IV SOLN
2.0000 g | INTRAVENOUS | Status: AC
  Administered 2014-05-19: 2 g via INTRAVENOUS
  Filled 2014-05-18: qty 2
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
## 2014-05-19 ENCOUNTER — Inpatient Hospital Stay (HOSPITAL_COMMUNITY)
Admission: RE | Admit: 2014-05-19 | Discharge: 2014-05-24 | DRG: 331 | Disposition: A | Discharge status: Home / Self Care | Payer: Medicare Other | Source: Ambulatory Visit | Attending: Surgery | Admitting: Surgery

## 2014-05-19 ENCOUNTER — Encounter (HOSPITAL_COMMUNITY)
Admission: RE | Disposition: A | Discharge status: Home / Self Care | Payer: Self-pay | Source: Ambulatory Visit | Attending: Surgery

## 2014-05-19 ENCOUNTER — Encounter (HOSPITAL_COMMUNITY): Payer: Self-pay | Admitting: *Deleted

## 2014-05-19 ENCOUNTER — Encounter (HOSPITAL_COMMUNITY): Payer: Medicare Other | Admitting: Vascular Surgery

## 2014-05-19 ENCOUNTER — Inpatient Hospital Stay (HOSPITAL_COMMUNITY): Payer: Medicare Other | Admitting: Certified Registered"

## 2014-05-19 DIAGNOSIS — E785 Hyperlipidemia, unspecified: Secondary | ICD-10-CM | POA: Diagnosis present

## 2014-05-19 DIAGNOSIS — E119 Type 2 diabetes mellitus without complications: Secondary | ICD-10-CM | POA: Diagnosis present

## 2014-05-19 DIAGNOSIS — Z5331 Laparoscopic surgical procedure converted to open procedure: Secondary | ICD-10-CM | POA: Diagnosis not present

## 2014-05-19 DIAGNOSIS — F411 Generalized anxiety disorder: Secondary | ICD-10-CM | POA: Diagnosis present

## 2014-05-19 DIAGNOSIS — Z87891 Personal history of nicotine dependence: Secondary | ICD-10-CM

## 2014-05-19 DIAGNOSIS — K5732 Diverticulitis of large intestine without perforation or abscess without bleeding: Principal | ICD-10-CM | POA: Diagnosis present

## 2014-05-19 DIAGNOSIS — E039 Hypothyroidism, unspecified: Secondary | ICD-10-CM | POA: Diagnosis present

## 2014-05-19 DIAGNOSIS — I10 Essential (primary) hypertension: Secondary | ICD-10-CM | POA: Diagnosis present

## 2014-05-19 DIAGNOSIS — K5792 Diverticulitis of intestine, part unspecified, without perforation or abscess without bleeding: Secondary | ICD-10-CM | POA: Diagnosis present

## 2014-05-19 HISTORY — PX: LAPAROSCOPIC PARTIAL COLECTOMY: SHX5907

## 2014-05-19 LAB — GLUCOSE, CAPILLARY
GLUCOSE-CAPILLARY: 127 mg/dL — AB (ref 70–99)
Glucose-Capillary: 99 mg/dL (ref 70–99)
Glucose-Capillary: 122 mg/dL — ABNORMAL HIGH (ref 70–99)
Glucose-Capillary: 147 mg/dL — ABNORMAL HIGH (ref 70–99)
Glucose-Capillary: 127 mg/dL — ABNORMAL HIGH (ref 70–99)

## 2014-05-19 SURGERY — LAPAROSCOPIC PARTIAL COLECTOMY
Anesthesia: General | Site: Abdomen

## 2014-05-19 MED ORDER — BENAZEPRIL HCL 20 MG PO TABS
20.0000 mg | ORAL_TABLET | Freq: Every day | ORAL | Status: DC
  Administered 2014-05-20 – 2014-05-23 (×4): 20 mg via ORAL
  Filled 2014-05-19 (×5): qty 1

## 2014-05-19 MED ORDER — SUCCINYLCHOLINE CHLORIDE 20 MG/ML IJ SOLN
INTRAMUSCULAR | Status: DC | PRN
  Administered 2014-05-19: 100 mg via INTRAVENOUS

## 2014-05-19 MED ORDER — NEOSTIGMINE METHYLSULFATE 10 MG/10ML IV SOLN
INTRAVENOUS | Status: DC | PRN
  Administered 2014-05-19: 4 mg via INTRAVENOUS

## 2014-05-19 MED ORDER — MORPHINE SULFATE 2 MG/ML IJ SOLN
1.0000 mg | INTRAMUSCULAR | Status: DC | PRN
  Administered 2014-05-20 (×2): 2 mg via INTRAVENOUS
  Administered 2014-05-20 (×2): 4 mg via INTRAVENOUS
  Administered 2014-05-20: 2 mg via INTRAVENOUS
  Administered 2014-05-21 (×6): 4 mg via INTRAVENOUS
  Administered 2014-05-21: 2 mg via INTRAVENOUS
  Administered 2014-05-22 (×6): 4 mg via INTRAVENOUS
  Administered 2014-05-23: 2 mg via INTRAVENOUS
  Administered 2014-05-24: 4 mg via INTRAVENOUS
  Administered 2014-05-24: 2 mg via INTRAVENOUS
  Administered 2014-05-24: 4 mg via INTRAVENOUS
  Filled 2014-05-19 (×3): qty 2
  Filled 2014-05-19: qty 1
  Filled 2014-05-19 (×4): qty 2
  Filled 2014-05-19: qty 1
  Filled 2014-05-19 (×4): qty 2
  Filled 2014-05-19: qty 1
  Filled 2014-05-19 (×5): qty 2
  Filled 2014-05-19 (×3): qty 1

## 2014-05-19 MED ORDER — HYDROMORPHONE 0.3 MG/ML IV SOLN
INTRAVENOUS | Status: DC
  Administered 2014-05-19: 14:00:00 via INTRAVENOUS

## 2014-05-19 MED ORDER — PROPOFOL 10 MG/ML IV BOLUS
INTRAVENOUS | Status: AC
  Filled 2014-05-19: qty 20

## 2014-05-19 MED ORDER — ONDANSETRON HCL 4 MG/2ML IJ SOLN: 4.0000 mg | Freq: Four times a day (QID) | INTRAMUSCULAR | Status: DC | PRN

## 2014-05-19 MED ORDER — BUPIVACAINE-EPINEPHRINE 0.25% -1:200000 IJ SOLN
INTRAMUSCULAR | Status: DC | PRN
  Administered 2014-05-19: 30 mL

## 2014-05-19 MED ORDER — ROCURONIUM BROMIDE 50 MG/5ML IV SOLN
INTRAVENOUS | Status: AC
  Filled 2014-05-19: qty 1

## 2014-05-19 MED ORDER — LACTATED RINGERS IV SOLN
INTRAVENOUS | Status: DC | PRN
  Administered 2014-05-19 (×2): via INTRAVENOUS

## 2014-05-19 MED ORDER — SODIUM CHLORIDE 0.9 % IR SOLN: Status: DC | PRN

## 2014-05-19 MED ORDER — SODIUM CHLORIDE 0.9 % IJ SOLN: 9.0000 mL | INTRAMUSCULAR | Status: DC | PRN

## 2014-05-19 MED ORDER — HYDROMORPHONE HCL PF 1 MG/ML IJ SOLN
INTRAMUSCULAR | Status: AC
  Filled 2014-05-19: qty 1

## 2014-05-19 MED ORDER — SUCCINYLCHOLINE CHLORIDE 20 MG/ML IJ SOLN
INTRAMUSCULAR | Status: AC
  Filled 2014-05-19: qty 1

## 2014-05-19 MED ORDER — DIPHENHYDRAMINE HCL 50 MG/ML IJ SOLN: 12.5000 mg | Freq: Four times a day (QID) | INTRAMUSCULAR | Status: DC | PRN

## 2014-05-19 MED ORDER — ENOXAPARIN SODIUM 40 MG/0.4ML ~~LOC~~ SOLN
40.0000 mg | SUBCUTANEOUS | Status: DC
  Administered 2014-05-20 – 2014-05-22 (×3): 40 mg via SUBCUTANEOUS
  Filled 2014-05-19 (×8): qty 0.4

## 2014-05-19 MED ORDER — DIPHENHYDRAMINE HCL 12.5 MG/5ML PO ELIX: 12.5000 mg | ORAL_SOLUTION | Freq: Four times a day (QID) | ORAL | Status: DC | PRN

## 2014-05-19 MED ORDER — HYDROMORPHONE 0.3 MG/ML IV SOLN
INTRAVENOUS | Status: AC
  Filled 2014-05-19: qty 25

## 2014-05-19 MED ORDER — NALOXONE HCL 0.4 MG/ML IJ SOLN
0.4000 mg | INTRAMUSCULAR | Status: DC | PRN
Start: 2014-05-19 — End: 2014-05-19

## 2014-05-19 MED ORDER — MIDAZOLAM HCL 2 MG/2ML IJ SOLN
INTRAMUSCULAR | Status: AC
  Filled 2014-05-19: qty 2

## 2014-05-19 MED ORDER — ONDANSETRON HCL 4 MG PO TABS: 4.0000 mg | ORAL_TABLET | Freq: Four times a day (QID) | ORAL | Status: DC | PRN

## 2014-05-19 MED ORDER — PROPOFOL 10 MG/ML IV BOLUS
INTRAVENOUS | Status: DC | PRN
  Administered 2014-05-19: 180 mg via INTRAVENOUS

## 2014-05-19 MED ORDER — AMITRIPTYLINE HCL 50 MG PO TABS
50.0000 mg | ORAL_TABLET | Freq: Every day | ORAL | Status: DC
  Administered 2014-05-19 – 2014-05-22 (×4): 50 mg via ORAL
  Filled 2014-05-19: qty 2
  Filled 2014-05-19: qty 1
  Filled 2014-05-19: qty 2
  Filled 2014-05-19 (×4): qty 1

## 2014-05-19 MED ORDER — ROCURONIUM BROMIDE 100 MG/10ML IV SOLN
INTRAVENOUS | Status: DC | PRN
  Administered 2014-05-19: 5 mg via INTRAVENOUS
  Administered 2014-05-19: 35 mg via INTRAVENOUS
  Administered 2014-05-19 (×2): 5 mg via INTRAVENOUS

## 2014-05-19 MED ORDER — INSULIN ASPART 100 UNIT/ML ~~LOC~~ SOLN
0.0000 [IU] | SUBCUTANEOUS | Status: DC
  Administered 2014-05-19 – 2014-05-20 (×3): 3 [IU] via SUBCUTANEOUS
  Administered 2014-05-20: 4 [IU] via SUBCUTANEOUS
  Administered 2014-05-20 – 2014-05-21 (×4): 3 [IU] via SUBCUTANEOUS
  Administered 2014-05-21 (×3): 4 [IU] via SUBCUTANEOUS
  Administered 2014-05-22 – 2014-05-23 (×4): 3 [IU] via SUBCUTANEOUS
  Administered 2014-05-23 (×2): 4 [IU] via SUBCUTANEOUS
  Administered 2014-05-24: 1 [IU] via SUBCUTANEOUS

## 2014-05-19 MED ORDER — LIDOCAINE HCL (CARDIAC) 20 MG/ML IV SOLN
INTRAVENOUS | Status: DC | PRN
  Administered 2014-05-19: 70 mg via INTRAVENOUS

## 2014-05-19 MED ORDER — BUPIVACAINE-EPINEPHRINE (PF) 0.25% -1:200000 IJ SOLN
INTRAMUSCULAR | Status: AC
  Filled 2014-05-19: qty 30

## 2014-05-19 MED ORDER — 0.9 % SODIUM CHLORIDE (POUR BTL) OPTIME
TOPICAL | Status: DC | PRN
  Administered 2014-05-19 (×3): 1000 mL

## 2014-05-19 MED ORDER — ZOLPIDEM TARTRATE 5 MG PO TABS
5.0000 mg | ORAL_TABLET | Freq: Every evening | ORAL | Status: DC | PRN
  Administered 2014-05-19 – 2014-05-23 (×5): 5 mg via ORAL
  Filled 2014-05-19 (×5): qty 1

## 2014-05-19 MED ORDER — GABAPENTIN 300 MG PO CAPS
300.0000 mg | ORAL_CAPSULE | Freq: Three times a day (TID) | ORAL | Status: DC
  Administered 2014-05-19 – 2014-05-23 (×14): 300 mg via ORAL
  Filled 2014-05-19 (×19): qty 1

## 2014-05-19 MED ORDER — FENTANYL CITRATE 0.05 MG/ML IJ SOLN
INTRAMUSCULAR | Status: AC
  Filled 2014-05-19: qty 5

## 2014-05-19 MED ORDER — FENTANYL CITRATE 0.05 MG/ML IJ SOLN
INTRAMUSCULAR | Status: DC | PRN
  Administered 2014-05-19 (×7): 50 ug via INTRAVENOUS
  Administered 2014-05-19: 150 ug via INTRAVENOUS

## 2014-05-19 MED ORDER — GLYCOPYRROLATE 0.2 MG/ML IJ SOLN
INTRAMUSCULAR | Status: DC | PRN
  Administered 2014-05-19: 0.2 mg via INTRAVENOUS
  Administered 2014-05-19: 0.6 mg via INTRAVENOUS
  Administered 2014-05-19: 0.2 mg via INTRAVENOUS

## 2014-05-19 MED ORDER — HYDROMORPHONE HCL PF 1 MG/ML IJ SOLN
1.0000 mg | INTRAMUSCULAR | Status: DC | PRN
  Administered 2014-05-19 – 2014-05-24 (×11): 1 mg via INTRAVENOUS
  Filled 2014-05-19 (×11): qty 1

## 2014-05-19 MED ORDER — HYDROMORPHONE HCL PF 1 MG/ML IJ SOLN
0.2500 mg | INTRAMUSCULAR | Status: DC | PRN
  Administered 2014-05-19 (×4): 0.5 mg via INTRAVENOUS

## 2014-05-19 MED ORDER — LIDOCAINE HCL (CARDIAC) 20 MG/ML IV SOLN
INTRAVENOUS | Status: AC
  Filled 2014-05-19: qty 5

## 2014-05-19 MED ORDER — ALVIMOPAN 12 MG PO CAPS
12.0000 mg | ORAL_CAPSULE | Freq: Two times a day (BID) | ORAL | Status: DC
  Administered 2014-05-20 – 2014-05-21 (×4): 12 mg via ORAL
  Filled 2014-05-19 (×9): qty 1

## 2014-05-19 MED ORDER — LACTATED RINGERS IV SOLN
INTRAVENOUS | Status: DC
Start: 2014-05-19 — End: 2014-05-19
  Administered 2014-05-19: 10:00:00 via INTRAVENOUS

## 2014-05-19 MED ORDER — POTASSIUM CHLORIDE IN NACL 20-0.9 MEQ/L-% IV SOLN
INTRAVENOUS | Status: DC
  Administered 2014-05-19 – 2014-05-24 (×9): via INTRAVENOUS
  Filled 2014-05-19 (×13): qty 1000

## 2014-05-19 MED ORDER — ONDANSETRON HCL 4 MG/2ML IJ SOLN
INTRAMUSCULAR | Status: DC | PRN
  Administered 2014-05-19: 4 mg via INTRAVENOUS

## 2014-05-19 MED ORDER — AMLODIPINE BESY-BENAZEPRIL HCL 10-20 MG PO CAPS: 1.0000 | ORAL_CAPSULE | Freq: Every day | ORAL | Status: DC

## 2014-05-19 MED ORDER — LEVOTHYROXINE SODIUM 200 MCG PO TABS
200.0000 ug | ORAL_TABLET | Freq: Every day | ORAL | Status: DC
  Administered 2014-05-20 – 2014-05-24 (×5): 200 ug via ORAL
  Filled 2014-05-19 (×2): qty 1
  Filled 2014-05-19: qty 2
  Filled 2014-05-19 (×2): qty 1
  Filled 2014-05-19: qty 2
  Filled 2014-05-19 (×2): qty 1
  Filled 2014-05-19: qty 2

## 2014-05-19 MED ORDER — AMLODIPINE BESYLATE 10 MG PO TABS
10.0000 mg | ORAL_TABLET | Freq: Every day | ORAL | Status: DC
  Administered 2014-05-20 – 2014-05-23 (×4): 10 mg via ORAL
  Filled 2014-05-19 (×5): qty 1

## 2014-05-19 SURGICAL SUPPLY — 61 items
APPLIER CLIP ROT 10 11.4 M/L (STAPLE)
BLADE SURG ROTATE 9660 (MISCELLANEOUS) ×3 IMPLANT
CANISTER SUCTION 2500CC (MISCELLANEOUS) ×3 IMPLANT
CHLORAPREP W/TINT 26ML (MISCELLANEOUS) ×3 IMPLANT
CLIP APPLIE ROT 10 11.4 M/L (STAPLE) IMPLANT
COVER MAYO STAND STRL (DRAPES) ×3 IMPLANT
COVER SURGICAL LIGHT HANDLE (MISCELLANEOUS) ×9 IMPLANT
DRAPE PROXIMA HALF (DRAPES) IMPLANT
DRAPE UTILITY 15X26 W/TAPE STR (DRAPE) ×15 IMPLANT
DRAPE WARM FLUID 44X44 (DRAPE) ×3 IMPLANT
DRSG OPSITE POSTOP 4X10 (GAUZE/BANDAGES/DRESSINGS) ×3 IMPLANT
ELECT BLADE 6.5 EXT (BLADE) ×6 IMPLANT
ELECT CAUTERY BLADE 6.4 (BLADE) ×6 IMPLANT
ELECT REM PT RETURN 9FT ADLT (ELECTROSURGICAL) ×3
ELECTRODE REM PT RTRN 9FT ADLT (ELECTROSURGICAL) ×1 IMPLANT
GLOVE BIOGEL PI IND STRL 6.5 (GLOVE) ×2 IMPLANT
GLOVE BIOGEL PI IND STRL 7.5 (GLOVE) ×2 IMPLANT
GLOVE BIOGEL PI INDICATOR 6.5 (GLOVE) ×4
GLOVE BIOGEL PI INDICATOR 7.5 (GLOVE) ×4
GLOVE ECLIPSE 6.5 STRL STRAW (GLOVE) ×6 IMPLANT
GLOVE ECLIPSE 7.5 STRL STRAW (GLOVE) ×9 IMPLANT
GLOVE SURG SIGNA 7.5 PF LTX (GLOVE) ×6 IMPLANT
GOWN STRL REUS W/ TWL LRG LVL3 (GOWN DISPOSABLE) ×9 IMPLANT
GOWN STRL REUS W/ TWL XL LVL3 (GOWN DISPOSABLE) ×2 IMPLANT
GOWN STRL REUS W/TWL LRG LVL3 (GOWN DISPOSABLE) ×18
GOWN STRL REUS W/TWL XL LVL3 (GOWN DISPOSABLE) ×4
KIT BASIN OR (CUSTOM PROCEDURE TRAY) ×3 IMPLANT
LEGGING LITHOTOMY PAIR STRL (DRAPES) ×3 IMPLANT
LIGASURE IMPACT 36 18CM CVD LR (INSTRUMENTS) ×3 IMPLANT
NS IRRIG 1000ML POUR BTL (IV SOLUTION) ×6 IMPLANT
PAD ARMBOARD 7.5X6 YLW CONV (MISCELLANEOUS) ×6 IMPLANT
PENCIL BUTTON HOLSTER BLD 10FT (ELECTRODE) ×6 IMPLANT
RELOAD PROXIMATE 75MM BLUE (ENDOMECHANICALS) ×6 IMPLANT
RETRACTOR WND ALEXIS 25 LRG (MISCELLANEOUS) ×1 IMPLANT
RTRCTR WOUND ALEXIS 25CM LRG (MISCELLANEOUS) ×3
SCALPEL HARMONIC ACE (MISCELLANEOUS) ×3 IMPLANT
SCISSORS LAP 5X35 DISP (ENDOMECHANICALS) ×3 IMPLANT
SLEEVE ENDOPATH XCEL 5M (ENDOMECHANICALS) ×3 IMPLANT
SPECIMEN JAR LARGE (MISCELLANEOUS) ×3 IMPLANT
SPONGE LAP 18X18 X RAY DECT (DISPOSABLE) ×9 IMPLANT
STAPLER GUN LINEAR PROX 60 (STAPLE) ×3 IMPLANT
STAPLER PROXIMATE 75MM BLUE (STAPLE) ×3 IMPLANT
STAPLER VISISTAT 35W (STAPLE) ×3 IMPLANT
SUCTION POOLE TIP (SUCTIONS) ×3 IMPLANT
SURGILUBE 2OZ TUBE FLIPTOP (MISCELLANEOUS) ×3 IMPLANT
SUT PDS AB 1 TP1 96 (SUTURE) ×6 IMPLANT
SUT SILK 2 0 SH CR/8 (SUTURE) ×3 IMPLANT
SUT SILK 2 0 TIES 10X30 (SUTURE) ×3 IMPLANT
SUT SILK 3 0 SH CR/8 (SUTURE) ×3 IMPLANT
SUT SILK 3 0 TIES 10X30 (SUTURE) ×3 IMPLANT
SYR BULB IRRIGATION 50ML (SYRINGE) ×6 IMPLANT
TOWEL OR 17X24 6PK STRL BLUE (TOWEL DISPOSABLE) ×3 IMPLANT
TOWEL OR 17X26 10 PK STRL BLUE (TOWEL DISPOSABLE) ×6 IMPLANT
TRAY FOLEY CATH 16FRSI W/METER (SET/KITS/TRAYS/PACK) ×3 IMPLANT
TRAY LAPAROSCOPIC (CUSTOM PROCEDURE TRAY) ×3 IMPLANT
TRAY PROCTOSCOPIC FIBER OPTIC (SET/KITS/TRAYS/PACK) ×3 IMPLANT
TROCAR XCEL BLUNT TIP 100MML (ENDOMECHANICALS) ×3 IMPLANT
TROCAR XCEL NON-BLD 5MMX100MML (ENDOMECHANICALS) ×3 IMPLANT
TUBE CONNECTING 12'X1/4 (SUCTIONS) ×2
TUBE CONNECTING 12X1/4 (SUCTIONS) ×4 IMPLANT
YANKAUER SUCT BULB TIP NO VENT (SUCTIONS) ×6 IMPLANT

## 2014-05-19 NOTE — Op Note (Signed)
LAPAROSCOPIC ASSISTED PARTIAL COLECTOMY  Procedure Note  Dan [name] 05/19/2014   Pre-op Diagnosis: Recurrent sigmoid diverticulitis with diverticulosis     Post-op Diagnosis: same  Procedure(s): LAPAROSCOPIC ASSISTED PARTIAL COLECTOMY TAKEDOWN OF SPLENIC FLEXURE  Surgeon(s): Coralie Keens, MD  Anesthesia: General  Staff:  Circulator: Jaynie Crumble, RN Relief Circulator: Anders Simmonds, RN Scrub Person: Carlisle Beers, CST; Leslie Andrea, CST; [place] Circulator Assistant: Tomma Rakers Sipsis, RN; Denna Haggard, RN RN First Assistant: Cyd Silence, RN Float Surgical Tech: Charna Elizabeth  Estimated Blood Loss: Minimal               Specimens: SENT TO PATH          Coralie Keens A   Date: 05/19/2014  Time: 1:46 PM

## 2014-05-19 NOTE — Anesthesia Preprocedure Evaluation (Addendum)
Anesthesia Evaluation  Patient identified by MRN, date of birth, ID band Patient awake    Reviewed: Allergy & Precautions, H&P , NPO status , Patient's Chart, lab work & pertinent test results, reviewed documented beta blocker date and time   Airway       Dental   Pulmonary pneumonia -, resolved, former smoker,          Cardiovascular hypertension, Pt. on medications     Neuro/Psych Anxiety    GI/Hepatic GERD-  ,  Endo/Other  diabetes, Well Controlled, Type 2, Oral Hypoglycemic AgentsHypothyroidism   Renal/GU      Musculoskeletal  (+) Arthritis -,   Abdominal   Peds  Hematology   Anesthesia Other Findings   Reproductive/Obstetrics                          Anesthesia Physical Anesthesia Plan  ASA: III  Anesthesia Plan:    Post-op Pain Management:    Induction:   Airway Management Planned:   Additional Equipment:   Intra-op Plan:   Post-operative Plan:   Informed Consent:   Plan Discussed with:   Anesthesia Plan Comments:         Anesthesia Quick Evaluation

## 2014-05-19 NOTE — Anesthesia Postprocedure Evaluation (Signed)
  Anesthesia Post-op Note  Patient: Dan [name]  Procedure(s) Performed: Procedure(s): LAPAROSCOPIC ASSISTED PARTIAL COLECTOMY (N/A)  Patient Location: PACU  Anesthesia Type:General  Level of Consciousness: awake  Airway and Oxygen Therapy: Patient Spontanous Breathing  Post-op Pain: mild  Post-op Assessment: Post-op Vital signs reviewed  Post-op Vital Signs: Reviewed  Last Vitals:  Filed Vitals:   05/19/14 1415  BP:   Pulse: 75  Temp:   Resp: 15    Complications: No apparent anesthesia complications

## 2014-05-19 NOTE — Progress Notes (Signed)
Late entry for 1459: Dr. Oletta Lamas notified re SBP still up in the 180's. Pain scale 5/10. Order received to give BP meds. Pharmacy notified.

## 2014-05-19 NOTE — Transfer of Care (Signed)
Immediate Anesthesia Transfer of Care Note  Patient: Dan [name]  Procedure(s) Performed: Procedure(s): LAPAROSCOPIC ASSISTED PARTIAL COLECTOMY (N/A)  Patient Location: PACU  Anesthesia Type:General  Level of Consciousness: awake, alert  and oriented  Airway & Oxygen Therapy: Patient connected to face mask oxygen  Post-op Assessment: Report given to PACU RN  Post vital signs: stable  Complications: No apparent anesthesia complications

## 2014-05-19 NOTE — Progress Notes (Signed)
Notified by Pharmacist that patient already took his dose of lotrel this morning.

## 2014-05-19 NOTE — Anesthesia Procedure Notes (Signed)
Procedure Name: Intubation Date/Time: 05/19/2014 11:43 AM Performed by: Maeola Harman Pre-anesthesia Checklist: Patient identified, Emergency Drugs available, Suction available, Timeout performed and Patient being monitored Patient Re-evaluated:Patient Re-evaluated prior to inductionOxygen Delivery Method: Circle system utilized Preoxygenation: Pre-oxygenation with 100% oxygen Intubation Type: IV induction Ventilation: Mask ventilation without difficulty and Oral airway inserted - appropriate to patient size Laryngoscope Size: 4 and Mac Grade View: Grade I Tube type: Oral Tube size: 7.5 mm Number of attempts: 1 Airway Equipment and Method: Stylet Placement Confirmation: ETT inserted through vocal cords under direct vision,  positive ETCO2 and breath sounds checked- equal and bilateral Secured at: 22 cm Tube secured with: Tape Dental Injury: Teeth and Oropharynx as per pre-operative assessment  Comments: Intubation by L Key, CRNA

## 2014-05-20 LAB — CBC
HEMATOCRIT: 46 % (ref 39.0–52.0)
Hemoglobin: 15.7 g/dL (ref 13.0–17.0)
MCH: 30.6 pg (ref 26.0–34.0)
MCHC: 34.1 g/dL (ref 30.0–36.0)
MCV: 89.7 fL (ref 78.0–100.0)
Platelets: 179 10*3/uL (ref 150–400)
RBC: 5.13 MIL/uL (ref 4.22–5.81)
RDW: 12.6 % (ref 11.5–15.5)
WBC: 12 10*3/uL — AB (ref 4.0–10.5)

## 2014-05-20 LAB — GLUCOSE, CAPILLARY
GLUCOSE-CAPILLARY: 109 mg/dL — AB (ref 70–99)
GLUCOSE-CAPILLARY: 121 mg/dL — AB (ref 70–99)
GLUCOSE-CAPILLARY: 134 mg/dL — AB (ref 70–99)
Glucose-Capillary: 100 mg/dL — ABNORMAL HIGH (ref 70–99)
Glucose-Capillary: 150 mg/dL — ABNORMAL HIGH (ref 70–99)
Glucose-Capillary: 155 mg/dL — ABNORMAL HIGH (ref 70–99)

## 2014-05-20 LAB — BASIC METABOLIC PANEL
ANION GAP: 14 (ref 5–15)
BUN: 7 mg/dL (ref 6–23)
CO2: 26 meq/L (ref 19–32)
Calcium: 9.2 mg/dL (ref 8.4–10.5)
Chloride: 100 mEq/L (ref 96–112)
Creatinine, Ser: 0.9 mg/dL (ref 0.50–1.35)
GFR calc Af Amer: >90 mL/min (ref >90)
GFR calc non Af Amer: 87 mL/min — ABNORMAL LOW (ref >90)
Glucose, Bld: 126 mg/dL — ABNORMAL HIGH (ref 70–99)
Potassium: 4.4 mEq/L (ref 3.7–5.3)
SODIUM: 140 meq/L (ref 137–147)

## 2014-05-20 NOTE — Op Note (Signed)
NAMECHARVEZ, VOORHIES NO.:  [id number]  MEDICAL RECORD NO.:  [id number]  LOCATION:  6N23C                        FACILITY:  [facility]  PHYSICIAN:  Coralie Keens, M.D. DATE OF BIRTH:  1946/08/16  DATE OF PROCEDURE:  05/19/2014 DATE OF DISCHARGE:                              OPERATIVE REPORT   PREOPERATIVE DIAGNOSIS:  Recurrent sigmoid diverticulitis with diverticulosis.  POSTOPERATIVE DIAGNOSIS:  Recurrent sigmoid diverticulitis with diverticulosis.  PROCEDURE: 1. Laparoscopic assisted partial colectomy. 2. Takedown of splenic flexure.  SURGEON:  Coralie Keens, M.D.  ASSISTANT:  Judyann Munson, RNFA.  ANESTHESIA:  General endotracheal anesthesia.  ESTIMATED BLOOD LOSS:  Minimal.  INDICATIONS:  This is a 68 year old gentleman with severe diverticulosis of left colon.  He has had multiple bouts of diverticulitis and has been on antibiotics multiple times for last several months.  He now wished proceed with partial colectomy.  PROCEDURE IN DETAIL:  The patient was brought to the operating room, identified as [facility].  He was placed supine on the operating room table, general anesthesia was induced.  A Foley catheter was then inserted.  He was then placed in lithotomy position.  His abdomen and perineum was then prepped and draped in usual sterile fashion.  I made a small incision above the umbilicus with a scalpel and took this down to fascia which was opened with the scalpel.  I then used a hemostat to pass to the peritoneal cavity under direct vision.  The 0 Vicryl pursestring suture was then placed around the fascial opening.  The Hasson port was placed in the opening and insufflation of the abdomen begun.  I then placed two 5 mm ports in the patient's lower midline. The patient's left colon was tethered to the abdominal sidewall.  I took this down with the Harmonic Scalpel.  I was able to easily elevate the rectum out of the pelvis.  The  patient had a very thickened descending colon.  I mobilized this along the white line of Toldt with the Harmonic scalpel.  At this point, it was difficult to tell where the area of diverticulosis ended secondary to thickening of the colon.  At this point, decision was made to convert to an open procedure after I had mobilized the colon a fair amount.  At this point, I connected all the incisions with a scalpel and took this down to the fascia with electrocautery moving all ports a completely laparoscopic portion of the procedure.  I then opened the  peritoneum in the entire length of the incision.  I then placed a wound protector in the abdomen.  I was able to elevate the colon up out of the wound.  The patient had a long redundant rectum.  I was able to easily identify the diverticular disease from the rectum going proximally.  I then mobilized the colon further along the white line of Toldt with electrocautery. I then took down the splenic flexure with the electrocautery and the ligature stapling device.  I then transected the colon at the rectum which again was quite long redundant with the GIA 75 stapler.  I then identified the descending colon which appeared healthy and transected with  the GIA 75 stapler as well.  I then mobilized the colon further as well as mobilizing the rectum.  At this point, the colon and rectum appeared that would fit together in a side-to-side fashion.  Again, it was quite long redundant rectum and I did not feel I could get in a EEA stapler all way up to the end.  At this point, I reapproximated the proximal colon to the rectum in a side-to-side fashion with silk sutures.  I then created 2 colotomies and performed a side-to-side anastomosis with a GIA 75 stapler.  I then closed the opening with a TA 60 stapler.  The wide pink anastomosis without tension appeared to be achieved.  I then reinforced the staple lines with interrupted 3-0 silk sutures.   Again, the anastomosis appeared pink and well perfused.  I then placed a bowel clamp proximal to the anastomosis.  I inserted proctoscope into the anus and insufflated the colon.  We filled the abdomen full of fluid and we were able to easily visualize the anastomosis.  No evidence of leak was identified.  At this point, the colon was deflated and the abdomen was thoroughly irrigated with several liters of normal saline.  At this point, hemostasis appeared to be achieved.  The wound protector was removed.  The patient was redraped and the surgical team re-gowned and gloved.  The patient's midline fascia was then closed with a running #1 looped PDS suture.  The skin was then irrigated and closed with skin staples.  The protective dressing was then applied.  The patient tolerated the procedure well.  All the counts were correct at the end of procedure.  The patient was then extubated the operating room and taken in stable condition to recovery room.     Coralie Keens, M.D.     DB/MEDQ  D:  05/19/2014  T:  05/20/2014  Job:  [id number]

## 2014-05-20 NOTE — Progress Notes (Signed)
1 Day Post-Op  Subjective: POD#1 comfortable  Objective: Vital signs in last 24 hours: Temp:  [97.2 F (36.2 C)-98.3 F (36.8 C)] 98.3 F (36.8 C) (09/12 0452) Pulse Rate:  [68-115] 89 (09/12 0452) Resp:  [10-18] 15 (09/12 0452) BP: (137-186)/(70-98) 164/71 mmHg (09/12 0452) SpO2:  [95 %-100 %] 95 % (09/12 0452) Weight:  [212 lb (96.163 kg)] 212 lb (96.163 kg) (09/11 0910) Last BM Date: 05/19/14  Intake/Output from previous day: 09/11 0701 - 09/12 0700 In: 1500 [I.V.:1500] Out: 1430 [Urine:1430] Intake/Output this shift:    Abdomen soft, obese, dressing dry  Lab Results:   Recent Labs  05/20/14 0732  WBC 12.0*  HGB 15.7  HCT 46.0  PLT 179   BMET  Recent Labs  05/20/14 0732  NA 140  K 4.4  CL 100  CO2 26  GLUCOSE 126*  BUN 7  CREATININE 0.90  CALCIUM 9.2   PT/INR No results found for this basename: LABPROT, INR,  in the last 72 hours ABG No results found for this basename: PHART, PCO2, PO2, HCO3,  in the last 72 hours  Studies/Results: No results found.  Anti-infectives: Anti-infectives   Start     Dose/Rate Route Frequency Ordered Stop   05/19/14 0600  cefoTEtan (CEFOTAN) 2 g in dextrose 5 % 50 mL IVPB     2 g 100 mL/hr over 30 Minutes Intravenous On call to O.R. 05/18/14 1405 05/19/14 1148      Assessment/Plan: s/p Procedure(s): LAPAROSCOPIC ASSISTED PARTIAL COLECTOMY (N/A)  Entereg protocol Start clears today D/c foley  LOS: 1 day    Jayni Prescher A 05/20/2014

## 2014-05-21 LAB — GLUCOSE, CAPILLARY
GLUCOSE-CAPILLARY: 149 mg/dL — AB (ref 70–99)
Glucose-Capillary: 113 mg/dL — ABNORMAL HIGH (ref 70–99)
Glucose-Capillary: 135 mg/dL — ABNORMAL HIGH (ref 70–99)
Glucose-Capillary: 158 mg/dL — ABNORMAL HIGH (ref 70–99)
Glucose-Capillary: 174 mg/dL — ABNORMAL HIGH (ref 70–99)
Glucose-Capillary: 178 mg/dL — ABNORMAL HIGH (ref 70–99)

## 2014-05-21 MED ORDER — SODIUM CHLORIDE 0.9 % IV BOLUS (SEPSIS)
1000.0000 mL | Freq: Once | INTRAVENOUS | Status: AC
  Administered 2014-05-21: 1000 mL via INTRAVENOUS

## 2014-05-21 NOTE — Progress Notes (Signed)
2 Days Post-Op  Subjective: Had mild bloating and cramping abdominal pain overnight Feels better this morning No nausea  Objective: Vital signs in last 24 hours: Temp:  [98.1 F (36.7 C)-99.4 F (37.4 C)] 98.1 F (36.7 C) (09/13 0435) Pulse Rate:  [98-112] 104 (09/13 0435) Resp:  [16] 16 (09/13 0435) BP: (121-142)/(46-72) 134/72 mmHg (09/13 0435) SpO2:  [92 %-96 %] 96 % (09/13 0435) Weight:  [212 lb (96.163 kg)] 212 lb (96.163 kg) (09/12 1700) Last BM Date: 05/19/14  Intake/Output from previous day: 09/12 0701 - 09/13 0700 In: 3579.6 [P.O.:1200; I.V.:2379.6] Out: 1275 [Urine:1275] Intake/Output this shift: Total I/O In: -  Out: 525 [Urine:525]  Abdomen obese, soft, dressing intact  Lab Results:   Recent Labs  05/20/14 0732  WBC 12.0*  HGB 15.7  HCT 46.0  PLT 179   BMET  Recent Labs  05/20/14 0732  NA 140  K 4.4  CL 100  CO2 26  GLUCOSE 126*  BUN 7  CREATININE 0.90  CALCIUM 9.2   PT/INR No results found for this basename: LABPROT, INR,  in the last 72 hours ABG No results found for this basename: PHART, PCO2, PO2, HCO3,  in the last 72 hours  Studies/Results: No results found.  Anti-infectives: Anti-infectives   Start     Dose/Rate Route Frequency Ordered Stop   05/19/14 0600  cefoTEtan (CEFOTAN) 2 g in dextrose 5 % 50 mL IVPB     2 g 100 mL/hr over 30 Minutes Intravenous On call to O.R. 05/18/14 1405 05/19/14 1148      Assessment/Plan: s/p Procedure(s): LAPAROSCOPIC ASSISTED PARTIAL COLECTOMY (N/A)  Continue clear liquids Bolus IVF Repeat labs in the morning  LOS: 2 days    Deshayla Empson A 05/21/2014

## 2014-05-22 ENCOUNTER — Encounter (HOSPITAL_COMMUNITY): Payer: Self-pay | Admitting: Surgery

## 2014-05-22 LAB — BASIC METABOLIC PANEL
Anion gap: 12 (ref 5–15)
BUN: 6 mg/dL (ref 6–23)
CHLORIDE: 100 meq/L (ref 96–112)
CO2: 25 meq/L (ref 19–32)
Calcium: 8.5 mg/dL (ref 8.4–10.5)
Creatinine, Ser: 0.74 mg/dL (ref 0.50–1.35)
GFR calc non Af Amer: >90 mL/min (ref >90)
GLUCOSE: 118 mg/dL — AB (ref 70–99)
POTASSIUM: 3.9 meq/L (ref 3.7–5.3)
Sodium: 137 mEq/L (ref 137–147)

## 2014-05-22 LAB — CBC
HCT: 38.4 % — ABNORMAL LOW (ref 39.0–52.0)
HEMOGLOBIN: 13.3 g/dL (ref 13.0–17.0)
MCH: 30.3 pg (ref 26.0–34.0)
MCHC: 34.6 g/dL (ref 30.0–36.0)
MCV: 87.5 fL (ref 78.0–100.0)
Platelets: 150 10*3/uL (ref 150–400)
RBC: 4.39 MIL/uL (ref 4.22–5.81)
RDW: 12.5 % (ref 11.5–15.5)
WBC: 9.6 10*3/uL (ref 4.0–10.5)

## 2014-05-22 LAB — GLUCOSE, CAPILLARY
GLUCOSE-CAPILLARY: 132 mg/dL — AB (ref 70–99)
Glucose-Capillary: 119 mg/dL — ABNORMAL HIGH (ref 70–99)
Glucose-Capillary: 142 mg/dL — ABNORMAL HIGH (ref 70–99)
Glucose-Capillary: 94 mg/dL (ref 70–99)
Glucose-Capillary: 99 mg/dL (ref 70–99)

## 2014-05-22 MED ORDER — HYDROCODONE-ACETAMINOPHEN 5-325 MG PO TABS
1.0000 | ORAL_TABLET | ORAL | Status: DC | PRN
  Administered 2014-05-22 – 2014-05-23 (×3): 2 via ORAL
  Filled 2014-05-22 (×3): qty 2

## 2014-05-22 NOTE — Progress Notes (Signed)
3 Days Post-Op  Subjective: No complaints Denies flatus Denies nausea or bloating Tolerating clears  Objective: Vital signs in last 24 hours: Temp:  [98.2 F (36.8 C)-99.4 F (37.4 C)] 99.4 F (37.4 C) (09/14 0545) Pulse Rate:  [88-101] 88 (09/14 0545) Resp:  [16-17] 16 (09/14 0545) BP: (115-151)/(57-72) 115/57 mmHg (09/14 0545) SpO2:  [95 %-96 %] 96 % (09/14 0545) Last BM Date: 05/19/14  Intake/Output from previous day: 09/13 0701 - 09/14 0700 In: 1816 [P.O.:480; I.V.:1336] Out: 3375 [Urine:3375] Intake/Output this shift:   Abdomen soft, +BS, dressing dry, minimally tender  Lab Results:   Recent Labs  05/20/14 0732 05/22/14 0203  WBC 12.0* 9.6  HGB 15.7 13.3  HCT 46.0 38.4*  PLT 179 150   BMET  Recent Labs  05/20/14 0732 05/22/14 0203  NA 140 137  K 4.4 3.9  CL 100 100  CO2 26 25  GLUCOSE 126* 118*  BUN 7 6  CREATININE 0.90 0.74  CALCIUM 9.2 8.5   PT/INR No results found for this basename: LABPROT, INR,  in the last 72 hours ABG No results found for this basename: PHART, PCO2, PO2, HCO3,  in the last 72 hours  Studies/Results: No results found.  Anti-infectives: Anti-infectives   Start     Dose/Rate Route Frequency Ordered Stop   05/19/14 0600  cefoTEtan (CEFOTAN) 2 g in dextrose 5 % 50 mL IVPB     2 g 100 mL/hr over 30 Minutes Intravenous On call to O.R. 05/18/14 1405 05/19/14 1148      Assessment/Plan: s/p Procedure(s): LAPAROSCOPIC ASSISTED PARTIAL COLECTOMY (N/A)  Decrease IVF Full liquid diet  LOS: 3 days    Sharyl Panchal A 05/22/2014

## 2014-05-23 LAB — CBC
HCT: 38.2 % — ABNORMAL LOW (ref 39.0–52.0)
Hemoglobin: 13.3 g/dL (ref 13.0–17.0)
MCH: 30.5 pg (ref 26.0–34.0)
MCHC: 34.8 g/dL (ref 30.0–36.0)
MCV: 87.6 fL (ref 78.0–100.0)
PLATELETS: 153 10*3/uL (ref 150–400)
RBC: 4.36 MIL/uL (ref 4.22–5.81)
RDW: 12.3 % (ref 11.5–15.5)
WBC: 6.8 10*3/uL (ref 4.0–10.5)

## 2014-05-23 LAB — GLUCOSE, CAPILLARY
GLUCOSE-CAPILLARY: 120 mg/dL — AB (ref 70–99)
GLUCOSE-CAPILLARY: 156 mg/dL — AB (ref 70–99)
GLUCOSE-CAPILLARY: 94 mg/dL (ref 70–99)
Glucose-Capillary: 137 mg/dL — ABNORMAL HIGH (ref 70–99)
Glucose-Capillary: 129 mg/dL — ABNORMAL HIGH (ref 70–99)
Glucose-Capillary: 156 mg/dL — ABNORMAL HIGH (ref 70–99)
Glucose-Capillary: 115 mg/dL — ABNORMAL HIGH (ref 70–99)
Glucose-Capillary: 141 mg/dL — ABNORMAL HIGH (ref 70–99)

## 2014-05-23 NOTE — Progress Notes (Signed)
4 Days Post-Op  Subjective: He reports more pain this morning laterally Has been ambulating a lot Had a BM yesterday  Objective: Vital signs in last 24 hours: Temp:  [98 F (36.7 C)-98.5 F (36.9 C)] 98 F (36.7 C) (09/15 0515) Pulse Rate:  [78-94] 78 (09/15 0515) Resp:  [16-18] 18 (09/15 0515) BP: (118-160)/(65-78) 118/65 mmHg (09/15 0515) SpO2:  [94 %-96 %] 94 % (09/15 0515) Last BM Date: 05/22/14  Intake/Output from previous day: 09/14 0701 - 09/15 0700 In: 1665.8 [P.O.:600; I.V.:1065.8] Out: 2050 [Urine:2050] Intake/Output this shift: Total I/O In: 520 [P.O.:120; I.V.:400] Out: 500 [Urine:500]  Abdomen is completely soft, almost non-tender  Lab Results:   Recent Labs  05/20/14 0732 05/22/14 0203  WBC 12.0* 9.6  HGB 15.7 13.3  HCT 46.0 38.4*  PLT 179 150   BMET  Recent Labs  05/20/14 0732 05/22/14 0203  NA 140 137  K 4.4 3.9  CL 100 100  CO2 26 25  GLUCOSE 126* 118*  BUN 7 6  CREATININE 0.90 0.74  CALCIUM 9.2 8.5   PT/INR No results found for this basename: LABPROT, INR,  in the last 72 hours ABG No results found for this basename: PHART, PCO2, PO2, HCO3,  in the last 72 hours  Studies/Results: No results found.  Anti-infectives: Anti-infectives   Start     Dose/Rate Route Frequency Ordered Stop   05/19/14 0600  cefoTEtan (CEFOTAN) 2 g in dextrose 5 % 50 mL IVPB     2 g 100 mL/hr over 30 Minutes Intravenous On call to O.R. 05/18/14 1405 05/19/14 1148      Assessment/Plan: s/p Procedure(s): LAPAROSCOPIC ASSISTED PARTIAL COLECTOMY (N/A)  Keep on full liquids Check a CBC this morning  LOS: 4 days    Dan [name] A 05/23/2014

## 2014-05-24 LAB — GLUCOSE, CAPILLARY
Glucose-Capillary: 127 mg/dL — ABNORMAL HIGH (ref 70–99)
Glucose-Capillary: 115 mg/dL — ABNORMAL HIGH (ref 70–99)

## 2014-05-24 MED ORDER — HYDROCODONE-ACETAMINOPHEN 5-325 MG PO TABS: 1.0000 | ORAL_TABLET | ORAL | Status: DC | PRN

## 2014-05-24 MED ORDER — ZOLPIDEM TARTRATE 10 MG PO TABS
10.0000 mg | ORAL_TABLET | Freq: Every evening | ORAL | Status: DC | PRN
Start: 2014-05-24 — End: 2022-08-26

## 2014-05-24 MED ORDER — ALPRAZOLAM 0.5 MG PO TABS: 0.5000 mg | ORAL_TABLET | Freq: Two times a day (BID) | ORAL | Status: DC | PRN

## 2014-05-24 NOTE — Discharge Instructions (Signed)
[facility]      [facility] [phone]  OPEN ABDOMINAL SURGERY: POST OP INSTRUCTIONS  Always review your discharge instruction sheet given to you by the facility where your surgery was performed.  IF YOU HAVE DISABILITY OR FAMILY LEAVE FORMS, YOU MUST BRING THEM TO THE OFFICE FOR PROCESSING.  PLEASE DO NOT GIVE THEM TO YOUR DOCTOR.  1. A prescription for pain medication may be given to you upon discharge.  Take your pain medication as prescribed, if needed.  If narcotic pain medicine is not needed, then you may take acetaminophen (Tylenol) or ibuprofen (Advil) as needed. 2. Take your usually prescribed medications unless otherwise directed. 3. If you need a refill on your pain medication, please contact your pharmacy. They will contact our office to request authorization.  Prescriptions will not be filled after 5pm or on week-ends. 4. You should follow a light diet the first few days after arrival home, such as soup and crackers, pudding, etc.unless your doctor has advised otherwise. A high-fiber, low fat diet can be resumed as tolerated.   Be sure to include lots of fluids daily. Most patients will experience some swelling and bruising on the chest and neck area.  Ice packs will help.  Swelling and bruising can take several days to resolve 5. Most patients will experience some swelling and bruising in the area of the incision. Ice pack will help. Swelling and bruising can take several days to resolve..  6. It is common to experience some constipation if taking pain medication after surgery.  Increasing fluid intake and taking a stool softener will usually help or prevent this problem from occurring.  A mild laxative (Milk of Magnesia or Miralax) should be taken according to package directions if there are no bowel movements after 48 hours. 7.  You may have steri-strips (small skin tapes) in place directly over the incision.  These strips should be left on the skin for 7-10 days.  If your  surgeon used skin glue on the incision, you may shower in 24 hours.  The glue will flake off over the next 2-3 weeks.  Any sutures or staples will be removed at the office during your follow-up visit. You may find that a light gauze bandage over your incision may keep your staples from being rubbed or pulled. You may shower and replace the bandage daily. 8. ACTIVITIES:  You may resume regular (light) daily activities beginning the next day--such as daily self-care, walking, climbing stairs--gradually increasing activities as tolerated.  You may have sexual intercourse when it is comfortable.  Refrain from any heavy lifting or straining until approved by your doctor. a. You may drive when you no longer are taking prescription pain medication, you can comfortably wear a seatbelt, and you can safely maneuver your car and apply brakes b. Return to Work: ___________________________________ 9. You should see your doctor in the office for a follow-up appointment approximately two weeks after your surgery.  Make sure that you call for this appointment within a day or two after you arrive home to insure a convenient appointment time. OTHER INSTRUCTIONS:  _____________________________________________________________ _____________________________________________________________  WHEN TO CALL YOUR DOCTOR: 1. Fever over 101.0 2. Inability to urinate 3. Nausea and/or vomiting 4. Extreme swelling or bruising 5. Continued bleeding from incision. 6. Increased pain, redness, or drainage from the incision. 7. Difficulty swallowing or breathing 8. Muscle cramping or spasms. 9. Numbness or tingling in hands or feet or around lips.  The clinic staff is available to   answer your questions during regular business hours.  Please don't hesitate to call and ask to speak to one of the nurses if you have concerns.  For further questions, please visit [email or web address]   

## 2014-05-24 NOTE — Progress Notes (Signed)
Patient ID: Dan [name], male   DOB: [birth year], 68 y.o.   MRN: [id number]  Doing well Having BM's Tolerating diet  Abdomen soft, NT, incision healing well  Plan: discharge

## 2014-05-24 NOTE — Progress Notes (Signed)
Pt. Discharge to home. Discharge instructions given to patient.  No question verbalized. 

## 2014-05-24 NOTE — Discharge Summary (Signed)
Physician Discharge Summary  Patient ID: Dan [name] MRN: [id number] DOB/AGE: 11-29-1945 68 y.o.  Admit date: 05/19/2014 Discharge date: 05/24/2014  Admission Diagnoses:  Discharge Diagnoses:  Active Problems:   Diverticulitis   Discharged Condition: good  Hospital Course: uneventful post op recovery.  On entereg protocol.  Liquids started POD#1.  Slowly advanced.  Tolerated po.  By POD#5 was ambulating well, having BM's, and tolerating a regular diet so discharged.  Consults: None  Significant Diagnostic Studies:   Treatments: surgery: laparoscopic assisted partial colectomy  Discharge Exam: Blood pressure 155/87, pulse 87, temperature 97.9 F (36.6 C), temperature source Oral, resp. rate 16, height 5\' 11"  (1.803 m), weight 212 lb (96.163 kg), SpO2 100.00%. General appearance: alert, cooperative and no distress Resp: clear to auscultation bilaterally Cardio: regular rate and rhythm, S1, S2 normal, no murmur, click, rub or gallop Incision/Wound: incision healing well.  Abdomen soft, non tender.  Disposition: 01-Home or Self Care     Medication List         ALPRAZolam 0.5 MG tablet  Commonly known as:  XANAX  Take 1 tablet (0.5 mg total) by mouth 2 (two) times daily as needed for anxiety.     amitriptyline 50 MG tablet  Commonly known as:  ELAVIL  Take 50 mg by mouth at bedtime.     amLODipine-benazepril 10-20 MG per capsule  Commonly known as:  LOTREL  Take 1 capsule by mouth.     amoxicillin-clavulanate 875-125 MG per tablet  Commonly known as:  AUGMENTIN  Take 1 tablet by mouth 2 (two) times daily. 7 day course started 05/06/14     aspirin EC 81 MG tablet  Take 81 mg by mouth daily.     atorvastatin 40 MG tablet  Commonly known as:  LIPITOR  Take 40 mg by mouth daily.     CINNAMON PO  Take 1,000 mg by mouth daily.     DULoxetine 60 MG capsule  Commonly known as:  CYMBALTA  Take 60 mg by mouth at bedtime.     furosemide 20 MG tablet  Commonly  known as:  LASIX  Take 20 mg by mouth daily as needed (feet swelling).     gabapentin 300 MG capsule  Commonly known as:  NEURONTIN  Take 300 mg by mouth 3 (three) times daily.     glyBURIDE 2.5 MG tablet  Commonly known as:  DIABETA  Take 2.5 mg by mouth daily with breakfast.     HYDROcodone-acetaminophen 5-325 MG per tablet  Commonly known as:  NORCO/VICODIN  Take 1 tablet by mouth daily as needed (pain).     HYDROcodone-acetaminophen 5-325 MG per tablet  Commonly known as:  NORCO/VICODIN  Take 1-2 tablets by mouth every 4 (four) hours as needed for moderate pain.     levothyroxine 200 MCG tablet  Commonly known as:  SYNTHROID, LEVOTHROID  Take 200 mcg by mouth daily before breakfast.     metFORMIN 1000 MG tablet  Commonly known as:  GLUCOPHAGE  Take 1,000 mg by mouth 2 (two) times daily with a meal.     multivitamin with minerals Tabs tablet  Take 1 tablet by mouth daily.     ondansetron 4 MG tablet  Commonly known as:  ZOFRAN  Take 4 mg by mouth every 6 (six) hours as needed for nausea or vomiting.     OVER THE COUNTER MEDICATION  Take 1 drop by mouth 4 (four) times daily. Equate eye drops for dry eyes  oxyCODONE-acetaminophen 5-325 MG per tablet  Commonly known as:  PERCOCET/ROXICET  Take 1 tablet by mouth every 6 (six) hours as needed for severe pain.     tadalafil 5 MG tablet  Commonly known as:  CIALIS  Take 5 mg by mouth daily as needed for erectile dysfunction.     zolpidem 10 MG tablet  Commonly known as:  AMBIEN  Take 1 tablet (10 mg total) by mouth at bedtime as needed for sleep.           Follow-up Information   Follow up with [facility] A, MD. Schedule an appointment as soon as possible for a visit in 1 week. (For suture removal)    Specialty:  General Surgery   Contact information:   [address] [place] [address] [phone]       Signed: Harl Bowie 05/24/2014, 8:02 AM

## 2014-06-13 ENCOUNTER — Telehealth (INDEPENDENT_AMBULATORY_CARE_PROVIDER_SITE_OTHER): Payer: Self-pay

## 2014-06-13 NOTE — Telephone Encounter (Signed)
Pt s/p partial colectomy on 05/19/14. Pt states that he would like to get a refill on his Hydrocodone 5/325mg . Pt rates his pain a 6-8 out of 10. Pt states he has been taking extra Tylenol. Advised pt that he can take Ibuprofen up to 800mg  every 8 hours as needed. Informed pt that I would send this message to Dr Ninfa Linden and we will contact him as soon as we recieve a message. Pt also wanted to let Dr Ninfa Linden know that when he went to see his PCP his WBC was elevated and he started him on a abx and he will have his PCP redraw again to check WBC. This message will also be out in EPIC.

## 2014-06-14 ENCOUNTER — Other Ambulatory Visit (INDEPENDENT_AMBULATORY_CARE_PROVIDER_SITE_OTHER): Payer: Self-pay

## 2014-06-14 DIAGNOSIS — R109 Unspecified abdominal pain: Secondary | ICD-10-CM

## 2014-06-15 ENCOUNTER — Other Ambulatory Visit (INDEPENDENT_AMBULATORY_CARE_PROVIDER_SITE_OTHER): Payer: Self-pay

## 2014-06-15 ENCOUNTER — Other Ambulatory Visit (INDEPENDENT_AMBULATORY_CARE_PROVIDER_SITE_OTHER): Payer: Self-pay | Admitting: Surgery

## 2014-06-15 DIAGNOSIS — R109 Unspecified abdominal pain: Secondary | ICD-10-CM

## 2014-06-20 ENCOUNTER — Ambulatory Visit
Admission: RE | Admit: 2014-06-20 | Discharge: 2014-06-20 | Disposition: A | Discharge status: Home / Self Care | Payer: 59 | Source: Ambulatory Visit | Attending: Surgery | Admitting: Surgery

## 2014-06-20 DIAGNOSIS — R109 Unspecified abdominal pain: Secondary | ICD-10-CM

## 2014-06-20 MED ORDER — IOHEXOL 300 MG/ML  SOLN
125.0000 mL | Freq: Once | INTRAMUSCULAR | Status: AC | PRN
  Administered 2014-06-20: 125 mL via INTRAVENOUS

## 2014-06-21 LAB — C. DIFFICILE GDH AND TOXIN A/B
C. difficile GDH: DETECTED — AB
C. difficile Toxin A/B: NOT DETECTED

## 2014-06-21 LAB — CLOSTRIDIUM DIFFICILE BY PCR: Toxigenic C. Difficile by PCR: DETECTED — CR

## 2014-08-22 ENCOUNTER — Other Ambulatory Visit (INDEPENDENT_AMBULATORY_CARE_PROVIDER_SITE_OTHER): Payer: Self-pay

## 2014-08-22 ENCOUNTER — Telehealth (INDEPENDENT_AMBULATORY_CARE_PROVIDER_SITE_OTHER): Payer: Self-pay

## 2014-08-22 DIAGNOSIS — R197 Diarrhea, unspecified: Secondary | ICD-10-CM

## 2014-08-22 NOTE — Telephone Encounter (Signed)
Pt s/p partial colectomy on 05/19/14 by Dr Ninfa Linden. Pt was being treated for C-diff with Vancomycin 125mg . Pt states that on Saturday(08/19/14) he started having loose BM's again and he would like to know if Dr Ninfa Linden would refill his Vancomycin. Informed pt that I would send a message to Dr Ninfa Linden and we would contact him as soon as we receive a response. Pt verbalized understanding

## 2014-08-22 NOTE — Telephone Encounter (Signed)
He needs to get his stool tested for c.diff first before starting Vanc

## 2014-08-22 NOTE — Telephone Encounter (Signed)
Informed pt of Dr Trevor Mace recommendations. Pt verbalized understanding

## 2014-08-23 ENCOUNTER — Other Ambulatory Visit (INDEPENDENT_AMBULATORY_CARE_PROVIDER_SITE_OTHER): Payer: Self-pay | Admitting: Surgery

## 2014-08-23 LAB — C. DIFFICILE GDH AND TOXIN A/B
C. DIFF TOXIN A/B: NOT DETECTED
C. difficile GDH: DETECTED — AB

## 2014-08-24 LAB — CLOSTRIDIUM DIFFICILE BY PCR: CDIFFPCR: DETECTED — AB

## 2014-08-28 ENCOUNTER — Telehealth: Payer: Self-pay | Admitting: Nurse Practitioner

## 2014-08-28 NOTE — Telephone Encounter (Signed)
Patient transferred his records and is seeing [name]. Patient advised that he would need to call his primary care provider.

## 2014-08-28 NOTE — Telephone Encounter (Signed)
Returning triage call.  Call him back at [phone].

## 2014-09-05 ENCOUNTER — Telehealth (INDEPENDENT_AMBULATORY_CARE_PROVIDER_SITE_OTHER): Payer: Self-pay

## 2014-09-05 NOTE — Telephone Encounter (Signed)
Patient calling into office to report that he's having ringing in his ears and a headache.  Patient ask if he has the ability to check his blood pressure at home.  Patient states he can but, it's too much trouble.  Patient taking Vancomycin for C-Diff.  Patient reports that his PCP recently prescribed Pioglitazone  (Actos) for his Diabetes.  Patient states he read about the medication online and it indicates that he should not take this medication if he has bladder cancer.  Patient advised to continue taking his antibiotic.  Patient advised to contact his PCP about his (Pioglitazone) Actos.  Patient aware to contact our office if he has any questions or concerns.  Patient aware that if his symptoms worsen to go to the nearest ER for further medical evaluation.  Patient verbalized understanding and agrees with plan as above.

## 2014-09-20 ENCOUNTER — Other Ambulatory Visit (INDEPENDENT_AMBULATORY_CARE_PROVIDER_SITE_OTHER): Payer: Self-pay | Admitting: Surgery

## 2014-10-05 NOTE — Patient Instructions (Addendum)
BENSEN CHADDERDON  10/05/2014   Your procedure is scheduled on: 10/17/14    Report to [facility] Main  Entrance and follow signs to               [place] at 5:30 AM.   Call this number if you have problems the morning of surgery [phone]   Remember:  Do not eat food or drink liquids :After Midnight.     Take these medicines the morning of surgery with A SIP OF WATER: AMLODIPINE / LIPITOR / GABAPENTIN / LEVOTHYROXINE / VANCOMYCIN / MAY TAKE HYDROCODONE IF NEEDED FOR PAIN             STOP ASPIRIN AND HERBAL MEDS 5 DAYS PREOP                               You may not have any metal on your body including hair pins and              piercings  Do not wear jewelry, make-up, lotions, powders or perfumes.             Do not wear nail polish.  Do not shave  48 hours prior to surgery.              Men may shave face and neck.   Do not bring valuables to the hospital. [place].  Contacts, dentures or bridgework may not be worn into surgery.  Leave suitcase in the car. After surgery it may be brought to your room.     Patients discharged the day of surgery will not be allowed to drive home.  Name and phone number of your driver:  Special Instructions: N/A              Please read over the following fact sheets you were given: _____________________________________________________________________                                                     [place]  Before surgery, you can play an important role.  Because skin is not sterile, your skin needs to be as free of germs as possible.  You can reduce the number of germs on your skin by washing with CHG (chlorahexidine gluconate) soap before surgery.  CHG is an antiseptic cleaner which kills germs and bonds with the skin to continue killing germs even after washing. Please DO NOT use if you have an allergy to CHG or antibacterial  soaps.  If your skin becomes reddened/irritated stop using the CHG and inform your nurse when you arrive at Short Stay. Do not shave (including legs and underarms) for at least 48 hours prior to the first CHG shower.  You may shave your face. Please follow these instructions carefully:   1.  Shower with CHG Soap the night before surgery and the  morning of Surgery.   2.  If you choose to wash your hair, wash your hair first as usual with your  normal  Shampoo.   3.  After you shampoo, rinse  your hair and body thoroughly to remove the  shampoo.                                         4.  Use CHG as you would any other liquid soap.  You can apply chg directly  to the skin and wash . Gently wash with scrungie or clean wascloth    5.  Apply the CHG Soap to your body ONLY FROM THE NECK DOWN.   Do not use on open                           Wound or open sores. Avoid contact with eyes, ears mouth and genitals (private parts).                        Genitals (private parts) with your normal soap.              6.  Wash thoroughly, paying special attention to the area where your surgery  will be performed.   7.  Thoroughly rinse your body with warm water from the neck down.   8.  DO NOT shower/wash with your normal soap after using and rinsing off  the CHG Soap .                9.  Pat yourself dry with a clean towel.             10.  Wear clean pajamas.             11.  Place clean sheets on your bed the night of your first shower and do not  sleep with pets.  Day of Surgery : Do not apply any lotions/deodorants the morning of surgery.  Please wear clean clothes to the hospital/surgery center.  FAILURE TO FOLLOW THESE INSTRUCTIONS MAY RESULT IN THE CANCELLATION OF YOUR SURGERY    PATIENT SIGNATURE_________________________________  ______________________________________________________________________

## 2014-10-06 ENCOUNTER — Encounter (HOSPITAL_COMMUNITY)
Admission: RE | Admit: 2014-10-06 | Discharge: 2014-10-06 | Disposition: A | Discharge status: Home / Self Care | Payer: Medicare Other | Source: Ambulatory Visit | Attending: Surgery | Admitting: Surgery

## 2014-10-06 ENCOUNTER — Encounter (HOSPITAL_COMMUNITY): Payer: Self-pay

## 2014-10-06 DIAGNOSIS — R1032 Left lower quadrant pain: Secondary | ICD-10-CM | POA: Insufficient documentation

## 2014-10-06 DIAGNOSIS — Z01818 Encounter for other preprocedural examination: Secondary | ICD-10-CM | POA: Insufficient documentation

## 2014-10-06 HISTORY — DX: Personal history of other infectious and parasitic diseases: Z86.19

## 2014-10-06 HISTORY — DX: Rash and other nonspecific skin eruption: R21

## 2014-10-06 LAB — CBC
HCT: 43.2 % (ref 39.0–52.0)
Hemoglobin: 15.2 g/dL (ref 13.0–17.0)
MCH: 30.3 pg (ref 26.0–34.0)
MCHC: 35.2 g/dL (ref 30.0–36.0)
MCV: 86.1 fL (ref 78.0–100.0)
Platelets: 200 10*3/uL (ref 150–400)
RBC: 5.02 MIL/uL (ref 4.22–5.81)
RDW: 13.3 % (ref 11.5–15.5)
WBC: 9.3 10*3/uL (ref 4.0–10.5)

## 2014-10-06 LAB — BASIC METABOLIC PANEL
Anion gap: 7 (ref 5–15)
BUN: 17 mg/dL (ref 6–23)
CO2: 30 mmol/L (ref 19–32)
Calcium: 9.8 mg/dL (ref 8.4–10.5)
Chloride: 105 mmol/L (ref 96–112)
Creatinine, Ser: 0.91 mg/dL (ref 0.50–1.35)
GFR, EST NON AFRICAN AMERICAN: 86 mL/min — AB (ref >90)
Glucose, Bld: 174 mg/dL — ABNORMAL HIGH (ref 70–99)
POTASSIUM: 4.6 mmol/L (ref 3.5–5.1)
Sodium: 142 mmol/L (ref 135–145)

## 2014-10-06 NOTE — Progress Notes (Signed)
   10/06/14 1101  OBSTRUCTIVE SLEEP APNEA  Have you ever been diagnosed with sleep apnea through a sleep study? No  Do you snore loudly (loud enough to be heard through closed doors)?  1  Do you often feel tired, fatigued, or sleepy during the daytime? 1  Has anyone observed you stop breathing during your sleep? 0  Do you have, or are you being treated for high blood pressure? 1  BMI more than 35 kg/m2? 0  Age over 67 years old? 1  Neck circumference greater than 40 cm/16 inches? 1  Gender: 1  Obstructive Sleep Apnea Score 6  Score 4 or greater  Results sent to PCP

## 2014-10-06 NOTE — Progress Notes (Signed)
PT instructed by phone not to take amlodipine/benazepril the morning of surgery , informed we would give him the amlodipine when he came in the day of surgery.
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
## 2014-10-17 ENCOUNTER — Ambulatory Visit (HOSPITAL_COMMUNITY): Payer: Medicare Other | Admitting: Anesthesiology

## 2014-10-17 ENCOUNTER — Encounter (HOSPITAL_COMMUNITY)
Admission: RE | Disposition: A | Discharge status: Home / Self Care | Payer: Self-pay | Source: Ambulatory Visit | Attending: Surgery

## 2014-10-17 ENCOUNTER — Ambulatory Visit (HOSPITAL_COMMUNITY)
Admission: RE | Admit: 2014-10-17 | Discharge: 2014-10-17 | Disposition: A | Discharge status: Home / Self Care | Payer: Medicare Other | Source: Ambulatory Visit | Attending: Surgery | Admitting: Surgery

## 2014-10-17 ENCOUNTER — Encounter (HOSPITAL_COMMUNITY): Payer: Self-pay | Admitting: *Deleted

## 2014-10-17 DIAGNOSIS — G4700 Insomnia, unspecified: Secondary | ICD-10-CM | POA: Diagnosis not present

## 2014-10-17 DIAGNOSIS — Z87891 Personal history of nicotine dependence: Secondary | ICD-10-CM | POA: Insufficient documentation

## 2014-10-17 DIAGNOSIS — E669 Obesity, unspecified: Secondary | ICD-10-CM | POA: Insufficient documentation

## 2014-10-17 DIAGNOSIS — E119 Type 2 diabetes mellitus without complications: Secondary | ICD-10-CM | POA: Diagnosis not present

## 2014-10-17 DIAGNOSIS — E039 Hypothyroidism, unspecified: Secondary | ICD-10-CM | POA: Diagnosis not present

## 2014-10-17 DIAGNOSIS — K219 Gastro-esophageal reflux disease without esophagitis: Secondary | ICD-10-CM | POA: Insufficient documentation

## 2014-10-17 DIAGNOSIS — Z8052 Family history of malignant neoplasm of bladder: Secondary | ICD-10-CM | POA: Insufficient documentation

## 2014-10-17 DIAGNOSIS — M1990 Unspecified osteoarthritis, unspecified site: Secondary | ICD-10-CM | POA: Insufficient documentation

## 2014-10-17 DIAGNOSIS — I10 Essential (primary) hypertension: Secondary | ICD-10-CM | POA: Insufficient documentation

## 2014-10-17 DIAGNOSIS — Z6831 Body mass index (BMI) 31.0-31.9, adult: Secondary | ICD-10-CM | POA: Diagnosis not present

## 2014-10-17 DIAGNOSIS — R1030 Lower abdominal pain, unspecified: Secondary | ICD-10-CM | POA: Insufficient documentation

## 2014-10-17 DIAGNOSIS — E785 Hyperlipidemia, unspecified: Secondary | ICD-10-CM | POA: Insufficient documentation

## 2014-10-17 DIAGNOSIS — Z8551 Personal history of malignant neoplasm of bladder: Secondary | ICD-10-CM | POA: Diagnosis not present

## 2014-10-17 HISTORY — PX: GROIN DISSECTION: SHX5250

## 2014-10-17 LAB — GLUCOSE, CAPILLARY
GLUCOSE-CAPILLARY: 170 mg/dL — AB (ref 70–99)
GLUCOSE-CAPILLARY: 172 mg/dL — AB (ref 70–99)

## 2014-10-17 SURGERY — EXPLORATION, INGUINAL REGION
Anesthesia: General | Laterality: Left

## 2014-10-17 MED ORDER — LIDOCAINE HCL (CARDIAC) 20 MG/ML IV SOLN
INTRAVENOUS | Status: DC | PRN
  Administered 2014-10-17: 100 mg via INTRAVENOUS

## 2014-10-17 MED ORDER — 0.9 % SODIUM CHLORIDE (POUR BTL) OPTIME
TOPICAL | Status: DC | PRN
  Administered 2014-10-17: 100 mL

## 2014-10-17 MED ORDER — FENTANYL CITRATE 0.05 MG/ML IJ SOLN
25.0000 ug | INTRAMUSCULAR | Status: DC | PRN
  Administered 2014-10-17 (×2): 50 ug via INTRAVENOUS

## 2014-10-17 MED ORDER — ACETAMINOPHEN 325 MG PO TABS: 650.0000 mg | ORAL_TABLET | ORAL | Status: DC | PRN

## 2014-10-17 MED ORDER — SODIUM CHLORIDE 0.9 % IV SOLN: 250.0000 mL | INTRAVENOUS | Status: DC | PRN

## 2014-10-17 MED ORDER — ROCURONIUM BROMIDE 100 MG/10ML IV SOLN
INTRAVENOUS | Status: AC
  Filled 2014-10-17: qty 1

## 2014-10-17 MED ORDER — MORPHINE SULFATE 10 MG/ML IJ SOLN: 1.0000 mg | INTRAMUSCULAR | Status: DC | PRN

## 2014-10-17 MED ORDER — CEFAZOLIN SODIUM-DEXTROSE 2-3 GM-% IV SOLR
INTRAVENOUS | Status: AC
  Filled 2014-10-17: qty 50

## 2014-10-17 MED ORDER — LACTATED RINGERS IV SOLN
INTRAVENOUS | Status: DC | PRN
  Administered 2014-10-17: 07:00:00 via INTRAVENOUS

## 2014-10-17 MED ORDER — ACETAMINOPHEN 650 MG RE SUPP
650.0000 mg | RECTAL | Status: DC | PRN
  Filled 2014-10-17: qty 1

## 2014-10-17 MED ORDER — AMLODIPINE BESYLATE 10 MG PO TABS
10.0000 mg | ORAL_TABLET | Freq: Once | ORAL | Status: AC
Start: 2014-10-17 — End: 2014-10-17
  Administered 2014-10-17: 10 mg via ORAL
  Filled 2014-10-17: qty 1

## 2014-10-17 MED ORDER — CEFAZOLIN SODIUM-DEXTROSE 2-3 GM-% IV SOLR
2.0000 g | INTRAVENOUS | Status: AC
  Administered 2014-10-17: 2 g via INTRAVENOUS

## 2014-10-17 MED ORDER — MIDAZOLAM HCL 5 MG/5ML IJ SOLN
INTRAMUSCULAR | Status: DC | PRN
  Administered 2014-10-17: 2 mg via INTRAVENOUS

## 2014-10-17 MED ORDER — MIDAZOLAM HCL 2 MG/2ML IJ SOLN
INTRAMUSCULAR | Status: AC
  Filled 2014-10-17: qty 2

## 2014-10-17 MED ORDER — PROPOFOL 10 MG/ML IV BOLUS
INTRAVENOUS | Status: DC | PRN
  Administered 2014-10-17: 200 mg via INTRAVENOUS

## 2014-10-17 MED ORDER — SODIUM CHLORIDE 0.9 % IJ SOLN: 3.0000 mL | INTRAMUSCULAR | Status: DC | PRN

## 2014-10-17 MED ORDER — FENTANYL CITRATE 0.05 MG/ML IJ SOLN
INTRAMUSCULAR | Status: DC | PRN
  Administered 2014-10-17: 100 ug via INTRAVENOUS

## 2014-10-17 MED ORDER — PROMETHAZINE HCL 25 MG/ML IJ SOLN: 6.2500 mg | INTRAMUSCULAR | Status: DC | PRN

## 2014-10-17 MED ORDER — ONDANSETRON HCL 4 MG/2ML IJ SOLN
INTRAMUSCULAR | Status: DC | PRN
  Administered 2014-10-17: 4 mg via INTRAVENOUS

## 2014-10-17 MED ORDER — LIDOCAINE HCL (CARDIAC) 20 MG/ML IV SOLN
INTRAVENOUS | Status: AC
  Filled 2014-10-17: qty 5

## 2014-10-17 MED ORDER — OXYCODONE HCL 5 MG PO TABS
5.0000 mg | ORAL_TABLET | ORAL | Status: DC | PRN
Start: 2014-10-17 — End: 2014-10-17

## 2014-10-17 MED ORDER — BUPIVACAINE-EPINEPHRINE 0.5% -1:200000 IJ SOLN
INTRAMUSCULAR | Status: DC | PRN
  Administered 2014-10-17: 30 mL

## 2014-10-17 MED ORDER — FENTANYL CITRATE 0.05 MG/ML IJ SOLN
INTRAMUSCULAR | Status: AC
  Filled 2014-10-17: qty 2

## 2014-10-17 MED ORDER — FENTANYL CITRATE 0.05 MG/ML IJ SOLN
INTRAMUSCULAR | Status: AC
  Filled 2014-10-17: qty 5

## 2014-10-17 MED ORDER — PROPOFOL 10 MG/ML IV BOLUS
INTRAVENOUS | Status: AC
  Filled 2014-10-17: qty 20

## 2014-10-17 MED ORDER — BUPIVACAINE-EPINEPHRINE (PF) 0.5% -1:200000 IJ SOLN
INTRAMUSCULAR | Status: AC
  Filled 2014-10-17: qty 30

## 2014-10-17 MED ORDER — ONDANSETRON HCL 4 MG/2ML IJ SOLN
INTRAMUSCULAR | Status: AC
  Filled 2014-10-17: qty 2

## 2014-10-17 MED ORDER — SODIUM CHLORIDE 0.9 % IJ SOLN
INTRAMUSCULAR | Status: AC
  Filled 2014-10-17: qty 10

## 2014-10-17 MED ORDER — OXYCODONE-ACETAMINOPHEN 5-325 MG PO TABS: 1.0000 | ORAL_TABLET | ORAL | Status: DC | PRN

## 2014-10-17 MED ORDER — EPHEDRINE SULFATE 50 MG/ML IJ SOLN
INTRAMUSCULAR | Status: AC
  Filled 2014-10-17: qty 1

## 2014-10-17 MED ORDER — SODIUM CHLORIDE 0.9 % IJ SOLN: 3.0000 mL | Freq: Two times a day (BID) | INTRAMUSCULAR | Status: DC

## 2014-10-17 SURGICAL SUPPLY — 47 items
APPLICATOR COTTON TIP 6IN STRL (MISCELLANEOUS) IMPLANT
BENZOIN TINCTURE PRP APPL 2/3 (GAUZE/BANDAGES/DRESSINGS) IMPLANT
BLADE CLIPPER SURG (BLADE) ×2 IMPLANT
BLADE SURG 10 STRL SS (BLADE) ×2 IMPLANT
CANISTER SUCTION 1200CC (MISCELLANEOUS) IMPLANT
CLEANER CAUTERY TIP 5X5 PAD (MISCELLANEOUS) IMPLANT
COVER MAYO STAND STRL (DRAPES) IMPLANT
COVER TABLE BACK 60X90 (DRAPES) IMPLANT
DRAIN PENROSE 18X1/2 LTX STRL (DRAIN) ×2 IMPLANT
DRAPE INCISE IOBAN 66X45 STRL (DRAPES) IMPLANT
DRAPE LAPAROTOMY T 98X78 PEDS (DRAPES) ×2 IMPLANT
ELECT REM PT RETURN 9FT ADLT (ELECTROSURGICAL) ×2
ELECTRODE REM PT RTRN 9FT ADLT (ELECTROSURGICAL) ×1 IMPLANT
GAUZE SPONGE 4X4 12PLY STRL (GAUZE/BANDAGES/DRESSINGS) IMPLANT
GLOVE INDICATOR 8.0 STRL GRN (GLOVE) IMPLANT
GLOVE OPTIFIT SS 7.5 STRL LX (GLOVE) ×2 IMPLANT
GOWN W/COTTON TOWEL STD LRG (GOWNS) ×4 IMPLANT
GOWN XL W/COTTON TOWEL STD (GOWNS) ×2 IMPLANT
LIQUID BAND (GAUZE/BANDAGES/DRESSINGS) ×2 IMPLANT
NEEDLE HYPO 22GX1.5 SAFETY (NEEDLE) ×2 IMPLANT
NEEDLE HYPO 25X1 1.5 SAFETY (NEEDLE) IMPLANT
NS IRRIG 500ML POUR BTL (IV SOLUTION) ×2 IMPLANT
PACK BASIN DAY SURGERY FS (CUSTOM PROCEDURE TRAY) ×2 IMPLANT
PAD CLEANER CAUTERY TIP 5X5 (MISCELLANEOUS)
PENCIL BUTTON HOLSTER BLD 10FT (ELECTRODE) ×2 IMPLANT
SPONGE LAP 4X18 X RAY DECT (DISPOSABLE) ×2 IMPLANT
STAPLER VISISTAT 35W (STAPLE) IMPLANT
STRIP CLOSURE SKIN 1/2X4 (GAUZE/BANDAGES/DRESSINGS) IMPLANT
SUT MNCRL AB 4-0 PS2 18 (SUTURE) ×2 IMPLANT
SUT PROLENE 2 0 CT2 30 (SUTURE) IMPLANT
SUT SILK 2 0 SH (SUTURE) IMPLANT
SUT SURG 0 T 19/GS 22 1969 62 (SUTURE) IMPLANT
SUT VIC AB 2-0 SH 27 (SUTURE) ×1
SUT VIC AB 2-0 SH 27X BRD (SUTURE) ×1 IMPLANT
SUT VIC AB 3-0 54X BRD REEL (SUTURE) IMPLANT
SUT VIC AB 3-0 BRD 54 (SUTURE)
SUT VIC AB 3-0 CT1 27 (SUTURE)
SUT VIC AB 3-0 CT1 27XBRD (SUTURE) IMPLANT
SUT VIC AB 3-0 SH 27 (SUTURE) ×1
SUT VIC AB 3-0 SH 27X BRD (SUTURE) ×1 IMPLANT
SYR BULB IRRIGATION 50ML (SYRINGE) IMPLANT
SYR CONTROL 10ML LL (SYRINGE) IMPLANT
TOWEL OR 17X24 6PK STRL BLUE (TOWEL DISPOSABLE) ×2 IMPLANT
TRAY DSU PREP LF (CUSTOM PROCEDURE TRAY) IMPLANT
TUBE CONNECTING 12X1/4 (SUCTIONS) IMPLANT
WATER STERILE IRR 500ML POUR (IV SOLUTION) IMPLANT
YANKAUER SUCT BULB TIP NO VENT (SUCTIONS) IMPLANT

## 2014-10-17 NOTE — Op Note (Signed)
LEFT GROIN EXPLORATION  Procedure Note  GEMINI BEAUMIER 10/17/2014   Pre-op Diagnosis: Left Groin Pain     Post-op Diagnosis: same  Procedure(s): LEFT GROIN EXPLORATION  Surgeon(s): Coralie Keens, MD  Anesthesia: General  Staff:  Circulator: Colin Ina, RN; Tamala Julian, RN Scrub Person: Joellen Jersey, CST  Estimated Blood Loss: Minimal                         Dan [name]   Date: 10/17/2014  Time: 8:00 AM

## 2014-10-17 NOTE — Anesthesia Preprocedure Evaluation (Addendum)
Anesthesia Evaluation  Patient identified by MRN, date of birth, ID band Patient awake    Reviewed: Allergy & Precautions, NPO status , Patient's Chart, lab work & pertinent test results  Airway Mallampati: II  TM Distance: >3 FB Neck ROM: Full    Dental no notable dental hx.    Pulmonary pneumonia -, resolved, former smoker,  breath sounds clear to auscultation  Pulmonary exam normal       Cardiovascular hypertension, negative cardio ROS  Rhythm:Regular Rate:Normal  ECG: NS TWA   Neuro/Psych PSYCHIATRIC DISORDERS Anxiety negative neurological ROS     GI/Hepatic Neg liver ROS, GERD-  ,  Endo/Other  diabetes, Type 2, Oral Hypoglycemic AgentsHypothyroidism   Renal/GU negative Renal ROS  negative genitourinary   Musculoskeletal  (+) Arthritis -,   Abdominal (+) + obese,   Peds negative pediatric ROS (+)  Hematology negative hematology ROS (+)   Anesthesia Other Findings   Reproductive/Obstetrics negative OB ROS                            Anesthesia Physical Anesthesia Plan  ASA: III  Anesthesia Plan: General   Post-op Pain Management:    Induction: Intravenous  Airway Management Planned: LMA  Additional Equipment:   Intra-op Plan:   Post-operative Plan: Extubation in OR  Informed Consent: I have reviewed the patients History and Physical, chart, labs and discussed the procedure including the risks, benefits and alternatives for the proposed anesthesia with the patient or authorized representative who has indicated his/her understanding and acceptance.   Dental advisory given  Plan Discussed with: CRNA  Anesthesia Plan Comments:         Anesthesia Quick Evaluation

## 2014-10-17 NOTE — Interval H&P Note (Signed)
History and Physical Interval Note: no change in H and P  10/17/2014 7:06 AM  Dan [name]  has presented today for surgery, with the diagnosis of Left Groin Pain  The various methods of treatment have been discussed with the patient and family. After consideration of risks, benefits and other options for treatment, the patient has consented to  Procedure(s): LEFT GROIN EXPLORATION (Left) as a surgical intervention .  The patient's history has been reviewed, patient examined, no change in status, stable for surgery.  I have reviewed the patient's chart and labs.  Questions were answered to the patient's satisfaction.     [name] A

## 2014-10-17 NOTE — Discharge Instructions (Signed)
Ok to shower starting tomorrow  Ice pack also for pain  No lifting more than 15 pounds for 4 weeks

## 2014-10-17 NOTE — Transfer of Care (Signed)
Immediate Anesthesia Transfer of Care Note  Patient: Dan [name]  Procedure(s) Performed: Procedure(s): LEFT GROIN EXPLORATION (Left)  Patient Location: PACU  Anesthesia Type:General  Level of Consciousness: awake, alert  and oriented  Airway & Oxygen Therapy: Patient Spontanous Breathing and Patient connected to face mask oxygen  Post-op Assessment: Report given to RN and Post -op Vital signs reviewed and stable  Post vital signs: Reviewed and stable  Last Vitals:  Filed Vitals:   10/17/14 0505  BP: 146/73  Pulse: 80  Temp: 36.4 C  Resp: 18    Complications: No apparent anesthesia complications

## 2014-10-17 NOTE — Anesthesia Postprocedure Evaluation (Signed)
  Anesthesia Post-op Note  Patient: Dan [name]  Procedure(s) Performed: Procedure(s) (LRB): LEFT GROIN EXPLORATION (Left)  Patient Location: PACU  Anesthesia Type: General  Level of Consciousness: awake and alert   Airway and Oxygen Therapy: Patient Spontanous Breathing  Post-op Pain: mild  Post-op Assessment: Post-op Vital signs reviewed, Patient's Cardiovascular Status Stable, Respiratory Function Stable, Patent Airway and No signs of Nausea or vomiting  Last Vitals:  Filed Vitals:   10/17/14 0950  BP: 160/67  Pulse: 79  Temp:   Resp: 18    Post-op Vital Signs: stable   Complications: No apparent anesthesia complications

## 2014-10-18 ENCOUNTER — Encounter (HOSPITAL_COMMUNITY): Payer: Self-pay | Admitting: Surgery

## 2014-10-18 NOTE — Op Note (Signed)
Dan [name], Dan [name] NO.:  [id number]  MEDICAL RECORD NO.:  [id number]  LOCATION:                                 FACILITY:  PHYSICIAN:  Coralie Keens, M.D. DATE OF BIRTH:  11/06/1945  DATE OF PROCEDURE:  10/17/2014 DATE OF DISCHARGE:  10/17/2014                              OPERATIVE REPORT   POSTOPERATIVE DIAGNOSIS:  Chronic left groin pain.  POSTOPERATIVE DIAGNOSIS:  Chronic left groin pain.  PROCEDURE:  Left groin exploration.  SURGEON:  Coralie Keens, M.D.  ANESTHESIA:  0.5% Marcaine with epinephrine and LMA.  ESTIMATED BLOOD LOSS:  Minimal.  INDICATIONS:  This is a 69 year old gentleman who had undergone a left inguinal hernia repair in approximately 2001 along with a circumcision. He has been having chronic left groin pain that hernia fixed with an open repair with mesh at that time.  His chronic pain persisted.  He developed a right inguinal hernia, so underwent a laparoscopic right inguinal hernia repair with mesh.  No causes.  His pain was found on left side, then another piece of mesh was placed laparoscopically. Since then, he has continued complaining of persistent left groin pain. Multiple CAT scans had been unremarkable.  He now presents for exploration of the groin.  Again, he understands this may not make any difference regarding his discomfort.  FINDINGS:  The patient's hernia repairs were found to be intact.  I identified what could be an ilioinguinal nerve and excised that tissue but I saw no other cause of his groin pain.  PROCEDURE:  The patient was brought to the operating room, identified as Shanon Payor.  He was placed supine on the operating room table and general anesthesia was induced.  His abdomen was then prepped and draped in usual sterile fashion.  I performed a left ilioinguinal nerve block with Marcaine.  I then anesthetized the skin as old scar with Marcaine as well.  I then made a longitudinal incision through  the previous scar with the scalpel.  I took this down through Scarpa's fascia with electrocautery.  He was mildly scarred in this area.  I then identified the external oblique fascia opened toward the internal and external ring.  I removed 1 Prolene suture which was fixated into the inguinal ligament and a large area scar and excised a little piece of scar tissue in that area.  I evaluated the inguinal area and saw no evidence of any kind of recurrent hernia.  His hernia repair felt totally intact.  I identified what appeared to be a potential ilioinguinal nerve and excised that tissue.  Again, after further exploration of the groin, I found no cause for his chronic discomfort.  At this point, I closed the external oblique fascia with a running 2-0 Vicryl suture.  I then closed the Scarpa's fascia with 3-0 Vicryl sutures and closed the skin with a running 4-0 Monocryl.  Dermabond was then applied.  The patient tolerated the procedure well.  All the counts were correct at the end of procedure. The patient was then extubated in the operating room and taken in stable condition to recovery room.     Coralie Keens, M.D.  DB/MEDQ  D:  10/17/2014  T:  10/17/2014  Job:  [id number]

## 2015-01-17 ENCOUNTER — Other Ambulatory Visit: Payer: Self-pay | Admitting: Orthopaedic Surgery

## 2015-01-17 DIAGNOSIS — M545 Low back pain: Secondary | ICD-10-CM

## 2015-01-20 ENCOUNTER — Ambulatory Visit
Admission: RE | Admit: 2015-01-20 | Discharge: 2015-01-20 | Disposition: A | Discharge status: Home / Self Care | Payer: Medicare Other | Source: Ambulatory Visit | Attending: Orthopaedic Surgery | Admitting: Orthopaedic Surgery

## 2015-01-20 DIAGNOSIS — M545 Low back pain: Secondary | ICD-10-CM

## 2015-06-25 ENCOUNTER — Other Ambulatory Visit: Payer: Self-pay | Admitting: *Deleted

## 2015-06-25 DIAGNOSIS — G8929 Other chronic pain: Secondary | ICD-10-CM

## 2015-06-25 DIAGNOSIS — R1032 Left lower quadrant pain: Principal | ICD-10-CM

## 2015-06-28 ENCOUNTER — Other Ambulatory Visit: Payer: Self-pay | Admitting: *Deleted

## 2015-06-28 ENCOUNTER — Other Ambulatory Visit: Payer: Self-pay | Admitting: Surgery

## 2015-06-28 DIAGNOSIS — R1032 Left lower quadrant pain: Principal | ICD-10-CM

## 2015-06-28 DIAGNOSIS — G8929 Other chronic pain: Secondary | ICD-10-CM

## 2015-06-29 ENCOUNTER — Ambulatory Visit
Admission: RE | Admit: 2015-06-29 | Discharge: 2015-06-29 | Disposition: A | Discharge status: Home / Self Care | Payer: Medicare Other | Source: Ambulatory Visit | Attending: *Deleted | Admitting: *Deleted

## 2015-06-29 DIAGNOSIS — R1032 Left lower quadrant pain: Principal | ICD-10-CM

## 2015-06-29 DIAGNOSIS — G8929 Other chronic pain: Secondary | ICD-10-CM

## 2015-06-29 MED ORDER — IOPAMIDOL (ISOVUE-300) INJECTION 61%
125.0000 mL | Freq: Once | INTRAVENOUS | Status: AC | PRN
  Administered 2015-06-29: 125 mL via INTRAVENOUS

## 2015-08-14 ENCOUNTER — Encounter: Payer: Self-pay | Admitting: Internal Medicine

## 2015-09-16 IMAGING — CT CT ABD-PELV W/ CM
2 of 5 series · 16 of 46 positions shown, 18 images · IV contrast (READICAT/WATER & [ID] OMNI 300)
Comparison: CT abdomen pelvis of 05/05/2014

CLINICAL DATA: History of left colectomy for chronic
diverticulitis, now with increasing pain and diarrhea

EXAM:
CT ABDOMEN AND PELVIS WITH CONTRAST
TECHNIQUE: Multidetector CT imaging of the abdomen and pelvis was performed
using the standard protocol following bolus administration of
intravenous contrast.
CONTRAST:  125mL OMNIPAQUE IOHEXOL 300 MG/ML  SOLN

[Series 2: abd/pelvis with · axial · 0.85mm/px · z∈[-515,-30]mm · 13 of 109 slices shown, 15 images]
[im 6/109  soft-tissue]
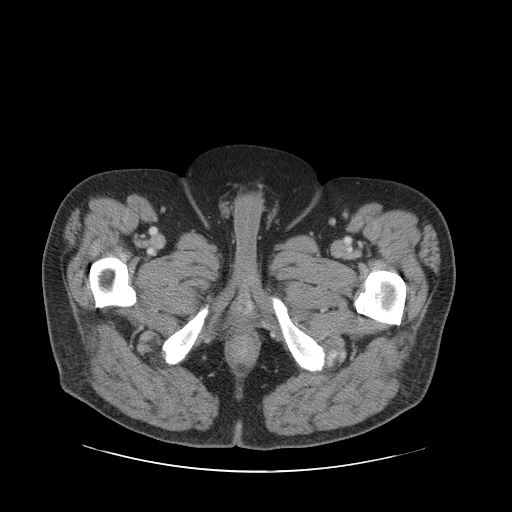
[im 6/109  bone]
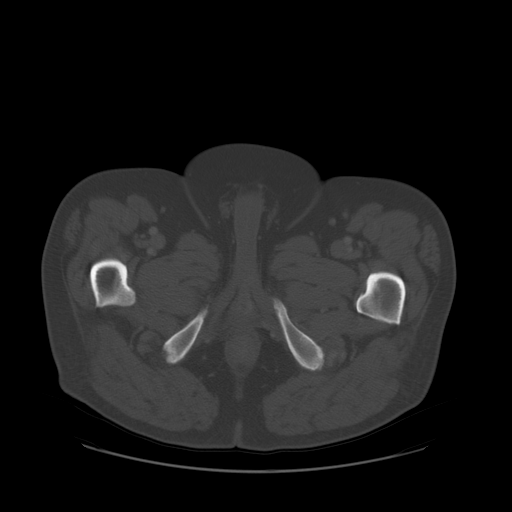
[im 18/109  soft-tissue]
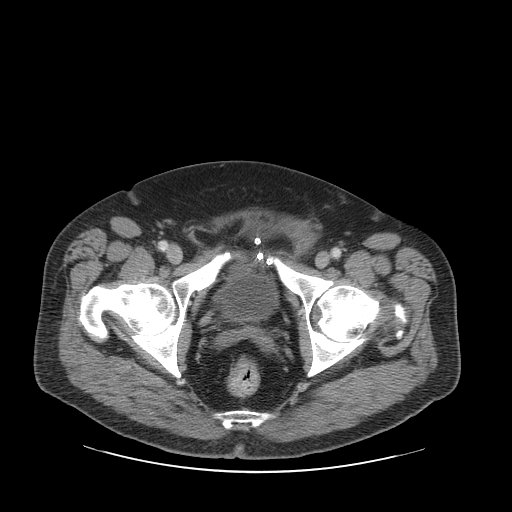
[im 23/109  soft-tissue]
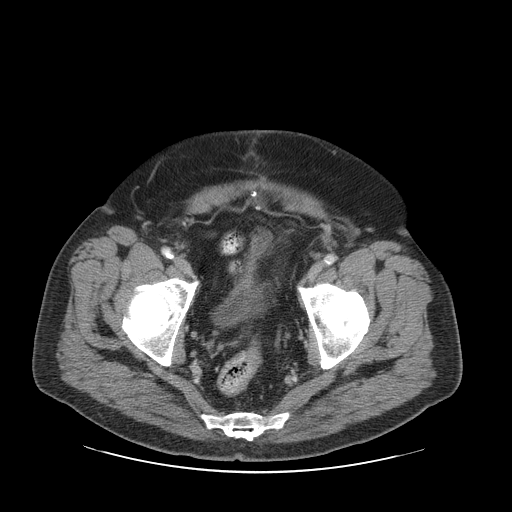
[im 29/109  soft-tissue]
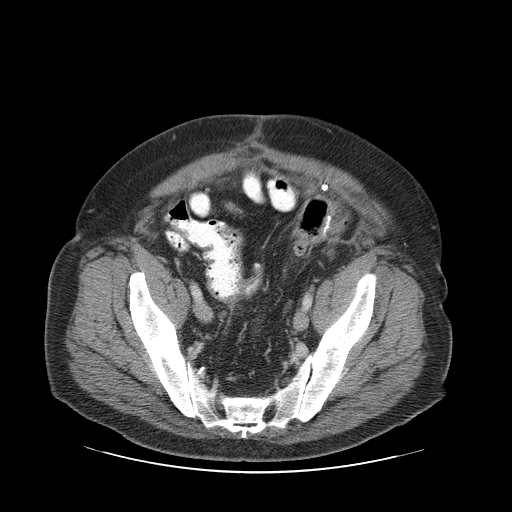
[im 40/109  soft-tissue]
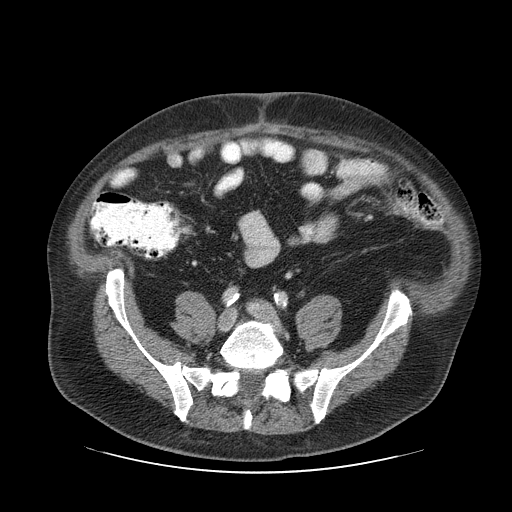
[im 46/109  soft-tissue]
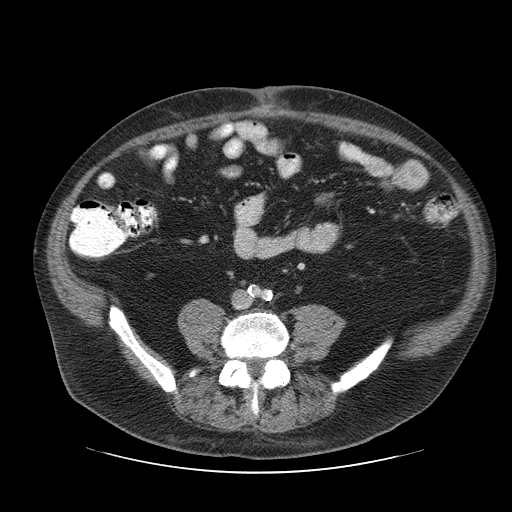
[im 57/109  soft-tissue]
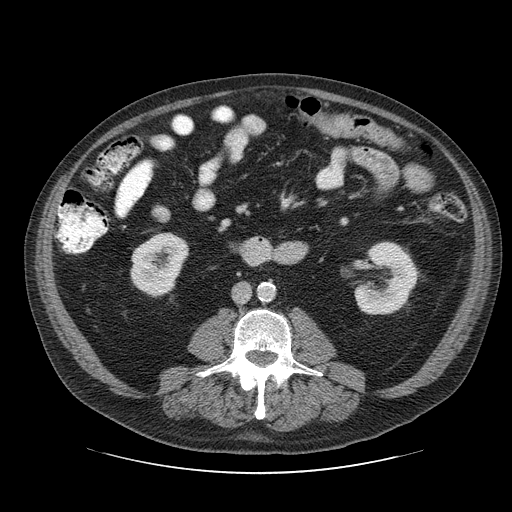
[im 63/109  soft-tissue]
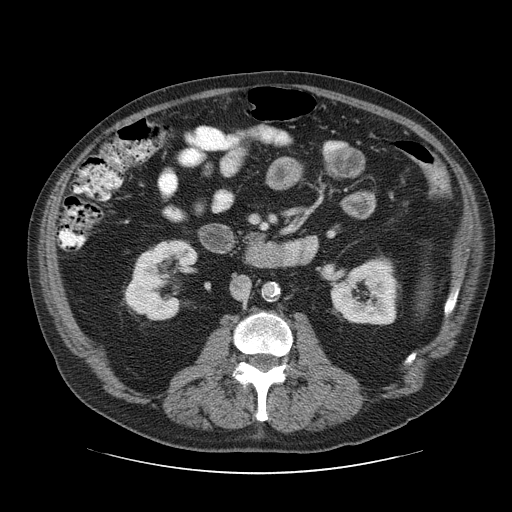
[im 69/109  soft-tissue]
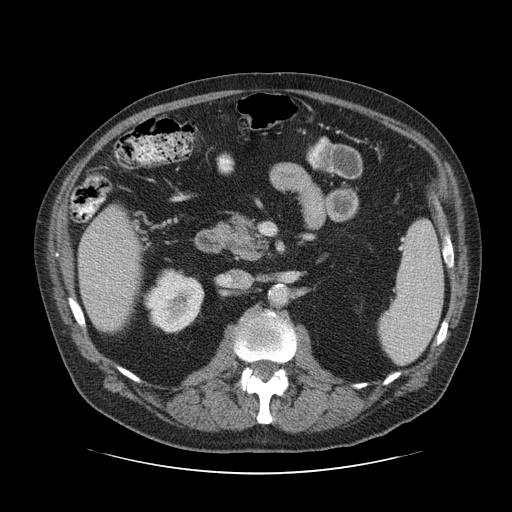
[im 69/109  bone]
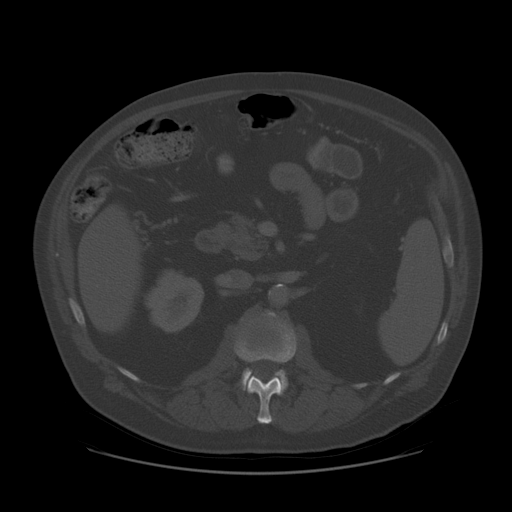
[im 80/109  soft-tissue]
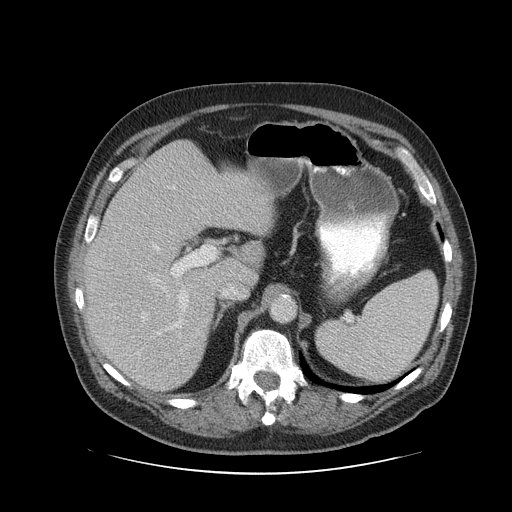
[im 86/109  soft-tissue]
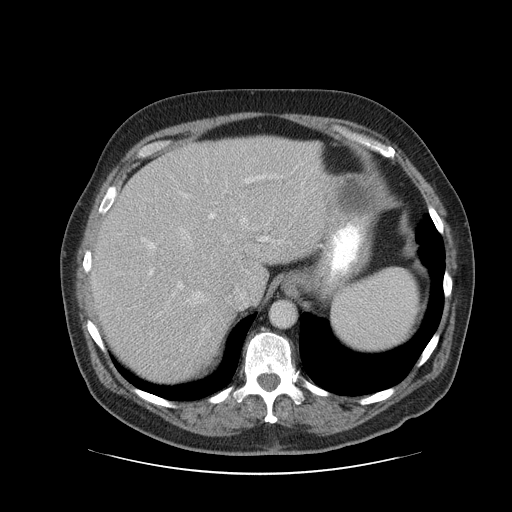
[im 91/109  soft-tissue]
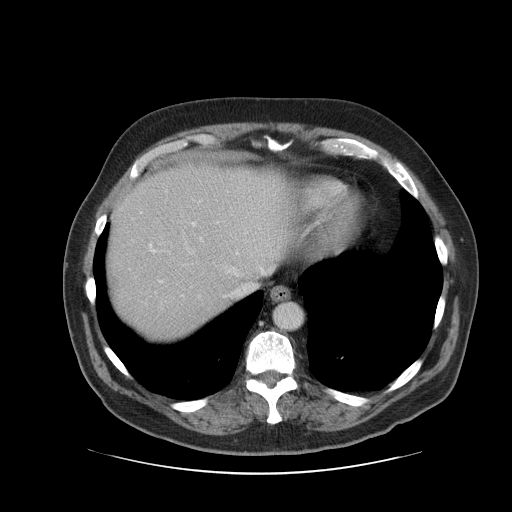
[im 103/109  soft-tissue]
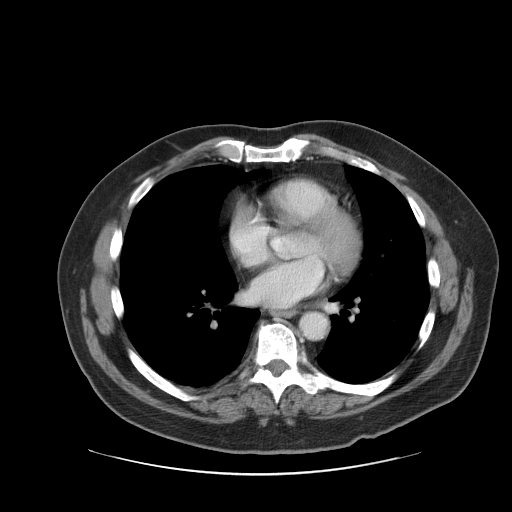

[Series 400: cor · coronal · 1.08mm/px · 3 of 165 slices shown]
[im 55/165  soft-tissue]
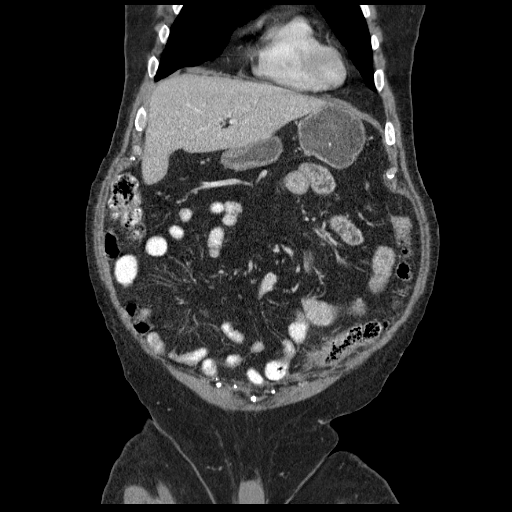
[im 73/165  soft-tissue]
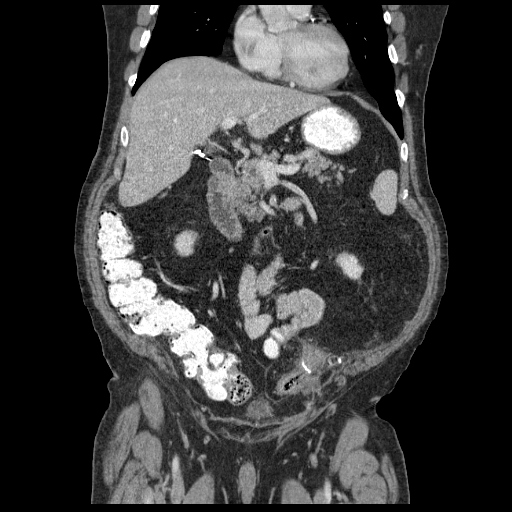
[im 92/165  soft-tissue]
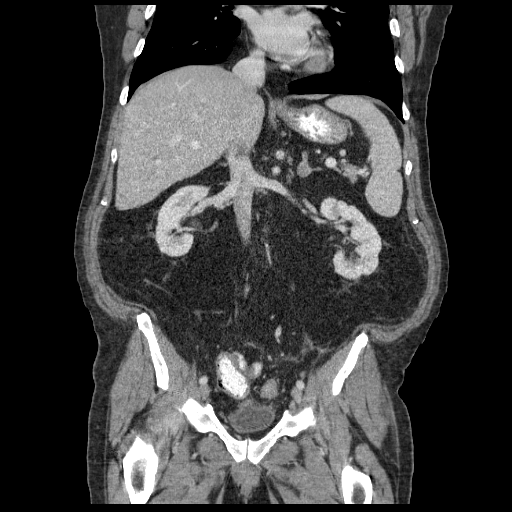

[16 of 46 positions shown; findings below may reference images not displayed]

FINDINGS: The lung bases are clear. Coronary artery calcifications are noted
in the distribution of the left anterior descending artery. A
moderate amount of epicardial fat is present. The liver enhances
with no suspicious abnormality, with only small sub cm cystic
structures are noted. Surgical clips are present from prior
cholecystectomy. The pancreas is normal in size and the pancreatic
duct is not dilated. The right adrenal gland is unremarkable. A
probable left incidental adrenal adenoma is noted of 16 mm in
diameter which is unchanged. The spleen is unremarkable. The stomach
is not well distended. The kidneys enhance with no calculus or mass
and on delayed images, the pelvocaliceal systems are unremarkable.
The proximal ureters are normal in caliber. The abdominal aorta is
normal in caliber with moderate atheromatous change present. No
adenopathy is seen.

The urinary bladder is not well distended. The prostate is normal in
size. Surgical sutures are noted at the site of anastomosis in the
left anterior pelvis, and there is mild strandiness within the left
pelvis as well most likely postoperative in nature. No definite
active inflammatory process is seen. No fluid collection is noted
near the anastomotic site. No definite colonic abnormality is seen.
The terminal ileum is unremarkable. The appendix has by history been
resected previously. No abdominal wall hernia is seen. On bone
window images, there is abnormality of the right femoral head
superiorly and laterally which may indicate mild avascular necrosis.
Clinical correlation is recommended. The lumbar vertebrae are in
normal alignment.
IMPRESSION: 1. Postoperative changes in the left pelvis. No abnormality is seen
at the site of anastomosis, with no abscess or inflammatory process
evident.
2. Suspect mild avascular necrosis of the right femoral head.

## 2015-10-26 ENCOUNTER — Encounter (HOSPITAL_COMMUNITY): Payer: Self-pay | Admitting: Emergency Medicine

## 2015-10-26 ENCOUNTER — Emergency Department (HOSPITAL_COMMUNITY)
Admission: EM | Admit: 2015-10-26 | Discharge: 2015-10-26 | Disposition: A | Discharge status: Home / Self Care | Payer: Medicare Other | Attending: Emergency Medicine | Admitting: Emergency Medicine

## 2015-10-26 DIAGNOSIS — G4700 Insomnia, unspecified: Secondary | ICD-10-CM | POA: Insufficient documentation

## 2015-10-26 DIAGNOSIS — Z8551 Personal history of malignant neoplasm of bladder: Secondary | ICD-10-CM | POA: Diagnosis not present

## 2015-10-26 DIAGNOSIS — I10 Essential (primary) hypertension: Secondary | ICD-10-CM | POA: Diagnosis not present

## 2015-10-26 DIAGNOSIS — Z7984 Long term (current) use of oral hypoglycemic drugs: Secondary | ICD-10-CM | POA: Diagnosis not present

## 2015-10-26 DIAGNOSIS — E039 Hypothyroidism, unspecified: Secondary | ICD-10-CM | POA: Diagnosis not present

## 2015-10-26 DIAGNOSIS — Z8719 Personal history of other diseases of the digestive system: Secondary | ICD-10-CM | POA: Insufficient documentation

## 2015-10-26 DIAGNOSIS — E785 Hyperlipidemia, unspecified: Secondary | ICD-10-CM | POA: Insufficient documentation

## 2015-10-26 DIAGNOSIS — E119 Type 2 diabetes mellitus without complications: Secondary | ICD-10-CM | POA: Diagnosis not present

## 2015-10-26 DIAGNOSIS — Z87891 Personal history of nicotine dependence: Secondary | ICD-10-CM | POA: Diagnosis not present

## 2015-10-26 DIAGNOSIS — Z79899 Other long term (current) drug therapy: Secondary | ICD-10-CM | POA: Diagnosis not present

## 2015-10-26 DIAGNOSIS — Z8701 Personal history of pneumonia (recurrent): Secondary | ICD-10-CM | POA: Insufficient documentation

## 2015-10-26 DIAGNOSIS — M5442 Lumbago with sciatica, left side: Secondary | ICD-10-CM | POA: Diagnosis not present

## 2015-10-26 DIAGNOSIS — F419 Anxiety disorder, unspecified: Secondary | ICD-10-CM | POA: Insufficient documentation

## 2015-10-26 DIAGNOSIS — Z8619 Personal history of other infectious and parasitic diseases: Secondary | ICD-10-CM | POA: Diagnosis not present

## 2015-10-26 DIAGNOSIS — Z86010 Personal history of colonic polyps: Secondary | ICD-10-CM | POA: Insufficient documentation

## 2015-10-26 DIAGNOSIS — M545 Low back pain: Secondary | ICD-10-CM | POA: Diagnosis present

## 2015-10-26 DIAGNOSIS — Z7982 Long term (current) use of aspirin: Secondary | ICD-10-CM | POA: Diagnosis not present

## 2015-10-26 LAB — URINALYSIS, ROUTINE W REFLEX MICROSCOPIC
Bilirubin Urine: NEGATIVE
Glucose, UA: >1000 mg/dL — AB
HGB URINE DIPSTICK: NEGATIVE
Ketones, ur: NEGATIVE mg/dL
Leukocytes, UA: NEGATIVE
Nitrite: NEGATIVE
PROTEIN: NEGATIVE mg/dL
Specific Gravity, Urine: 1.024 (ref 1.005–1.030)
pH: 5 (ref 5.0–8.0)

## 2015-10-26 LAB — URINE MICROSCOPIC-ADD ON
Bacteria, UA: NONE SEEN
RBC / HPF: NONE SEEN RBC/hpf (ref 0–5)
SQUAMOUS EPITHELIAL / LPF: NONE SEEN
WBC UA: NONE SEEN WBC/hpf (ref 0–5)

## 2015-10-26 MED ORDER — METHOCARBAMOL 500 MG PO TABS: 500.0000 mg | ORAL_TABLET | Freq: Two times a day (BID) | ORAL | Status: DC

## 2015-10-26 MED ORDER — KETOROLAC TROMETHAMINE 60 MG/2ML IM SOLN
30.0000 mg | Freq: Once | INTRAMUSCULAR | Status: AC
  Administered 2015-10-26: 30 mg via INTRAMUSCULAR
  Filled 2015-10-26: qty 2

## 2015-10-26 MED ORDER — LIDOCAINE 5 % EX PTCH: 1.0000 | MEDICATED_PATCH | CUTANEOUS | Status: DC

## 2015-10-26 MED ORDER — OXYCODONE-ACETAMINOPHEN 5-325 MG PO TABS
1.0000 | ORAL_TABLET | Freq: Once | ORAL | Status: AC
  Administered 2015-10-26: 1 via ORAL
  Filled 2015-10-26: qty 1

## 2015-10-26 MED ORDER — IBUPROFEN 400 MG PO TABS: 400.0000 mg | ORAL_TABLET | Freq: Four times a day (QID) | ORAL | Status: DC | PRN

## 2015-10-26 MED ORDER — PREDNISONE 20 MG PO TABS
60.0000 mg | ORAL_TABLET | Freq: Once | ORAL | Status: AC
  Administered 2015-10-26: 60 mg via ORAL
  Filled 2015-10-26: qty 3

## 2015-10-26 MED ORDER — OXYCODONE-ACETAMINOPHEN 5-325 MG PO TABS: 2.0000 | ORAL_TABLET | ORAL | Status: DC | PRN

## 2015-10-26 MED ORDER — LIDOCAINE 5 % EX PTCH
1.0000 | MEDICATED_PATCH | CUTANEOUS | Status: DC
  Administered 2015-10-26: 1 via TRANSDERMAL
  Filled 2015-10-26: qty 1

## 2015-10-26 MED ORDER — METHOCARBAMOL 500 MG PO TABS
500.0000 mg | ORAL_TABLET | Freq: Once | ORAL | Status: AC
  Administered 2015-10-26: 500 mg via ORAL
  Filled 2015-10-26: qty 1

## 2015-10-26 NOTE — Discharge Instructions (Signed)
You have been seen today for back pain. Your lab tests showed no abnormalities. Follow up with [facility] as soon as possible for reevaluation and chronic management. Follow up with PCP as needed. Return to ED should symptoms worsen.
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
## 2015-10-26 NOTE — ED Notes (Signed)
Pt states he has a ride home

## 2015-10-26 NOTE — ED Notes (Signed)
Per pt, states lower back pain on and off for 6 weeks-states it radiates down legs

## 2015-10-28 LAB — URINE CULTURE

## 2016-08-20 ENCOUNTER — Other Ambulatory Visit: Payer: Self-pay | Admitting: *Deleted

## 2016-08-20 DIAGNOSIS — M79605 Pain in left leg: Principal | ICD-10-CM

## 2016-08-20 DIAGNOSIS — M79604 Pain in right leg: Secondary | ICD-10-CM

## 2016-09-30 ENCOUNTER — Encounter: Payer: Self-pay | Admitting: Vascular Surgery

## 2016-10-03 ENCOUNTER — Encounter: Payer: Self-pay | Admitting: Vascular Surgery

## 2016-10-03 ENCOUNTER — Ambulatory Visit (INDEPENDENT_AMBULATORY_CARE_PROVIDER_SITE_OTHER): Payer: Medicare Other | Admitting: Vascular Surgery

## 2016-10-03 ENCOUNTER — Ambulatory Visit (HOSPITAL_COMMUNITY)
Admission: RE | Admit: 2016-10-03 | Discharge: 2016-10-03 | Disposition: A | Discharge status: Home / Self Care | Payer: Medicare Other | Source: Ambulatory Visit | Attending: Vascular Surgery | Admitting: Vascular Surgery

## 2016-10-03 VITALS — BP 141/81 | HR 84 | Temp 99.2°F | Resp 18 | Ht 71.0 in | Wt 220.4 lb

## 2016-10-03 DIAGNOSIS — E119 Type 2 diabetes mellitus without complications: Secondary | ICD-10-CM | POA: Diagnosis not present

## 2016-10-03 DIAGNOSIS — I10 Essential (primary) hypertension: Secondary | ICD-10-CM | POA: Insufficient documentation

## 2016-10-03 DIAGNOSIS — M79605 Pain in left leg: Secondary | ICD-10-CM | POA: Insufficient documentation

## 2016-10-03 DIAGNOSIS — Z87891 Personal history of nicotine dependence: Secondary | ICD-10-CM | POA: Diagnosis not present

## 2016-10-03 DIAGNOSIS — E1140 Type 2 diabetes mellitus with diabetic neuropathy, unspecified: Secondary | ICD-10-CM | POA: Diagnosis not present

## 2016-10-03 DIAGNOSIS — E785 Hyperlipidemia, unspecified: Secondary | ICD-10-CM | POA: Diagnosis not present

## 2016-10-03 DIAGNOSIS — M79604 Pain in right leg: Secondary | ICD-10-CM | POA: Diagnosis not present
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
## 2016-11-12 ENCOUNTER — Emergency Department (HOSPITAL_COMMUNITY)
Admission: EM | Admit: 2016-11-12 | Discharge: 2016-11-12 | Disposition: A | Discharge status: Home / Self Care | Payer: Medicare Other | Attending: Emergency Medicine | Admitting: Emergency Medicine

## 2016-11-12 ENCOUNTER — Encounter (HOSPITAL_COMMUNITY): Payer: Self-pay

## 2016-11-12 DIAGNOSIS — E039 Hypothyroidism, unspecified: Secondary | ICD-10-CM | POA: Diagnosis not present

## 2016-11-12 DIAGNOSIS — E1140 Type 2 diabetes mellitus with diabetic neuropathy, unspecified: Secondary | ICD-10-CM | POA: Diagnosis not present

## 2016-11-12 DIAGNOSIS — I10 Essential (primary) hypertension: Secondary | ICD-10-CM | POA: Diagnosis present

## 2016-11-12 DIAGNOSIS — E1165 Type 2 diabetes mellitus with hyperglycemia: Secondary | ICD-10-CM | POA: Diagnosis not present

## 2016-11-12 DIAGNOSIS — Z79899 Other long term (current) drug therapy: Secondary | ICD-10-CM | POA: Diagnosis not present

## 2016-11-12 DIAGNOSIS — Z794 Long term (current) use of insulin: Secondary | ICD-10-CM | POA: Insufficient documentation

## 2016-11-12 DIAGNOSIS — Z87891 Personal history of nicotine dependence: Secondary | ICD-10-CM | POA: Diagnosis not present

## 2016-11-12 DIAGNOSIS — Z8551 Personal history of malignant neoplasm of bladder: Secondary | ICD-10-CM | POA: Diagnosis not present

## 2016-11-12 DIAGNOSIS — R739 Hyperglycemia, unspecified: Secondary | ICD-10-CM

## 2016-11-12 LAB — I-STAT CHEM 8, ED
BUN: 14 mg/dL (ref 6–20)
Calcium, Ion: 1.06 mmol/L — ABNORMAL LOW (ref 1.15–1.40)
Chloride: 105 mmol/L (ref 101–111)
Creatinine, Ser: 0.8 mg/dL (ref 0.61–1.24)
Glucose, Bld: 165 mg/dL — ABNORMAL HIGH (ref 65–99)
HEMATOCRIT: 43 % (ref 39.0–52.0)
HEMOGLOBIN: 14.6 g/dL (ref 13.0–17.0)
POTASSIUM: 3.9 mmol/L (ref 3.5–5.1)
Sodium: 140 mmol/L (ref 135–145)
TCO2: 26 mmol/L (ref 0–100)

## 2016-11-12 LAB — URINALYSIS, ROUTINE W REFLEX MICROSCOPIC
Bilirubin Urine: NEGATIVE
Glucose, UA: NEGATIVE mg/dL
HGB URINE DIPSTICK: NEGATIVE
Ketones, ur: NEGATIVE mg/dL
LEUKOCYTES UA: NEGATIVE
Nitrite: NEGATIVE
PROTEIN: NEGATIVE mg/dL
Specific Gravity, Urine: 1.013 (ref 1.005–1.030)
pH: 5 (ref 5.0–8.0)

## 2016-11-12 LAB — CBG MONITORING, ED: GLUCOSE-CAPILLARY: 190 mg/dL — AB (ref 65–99)

## 2016-11-12 NOTE — Discharge Instructions (Signed)
Read the information below.  You may return to the Emergency Department at any time for worsening condition or any new symptoms that concern you. °

## 2016-11-12 NOTE — ED Triage Notes (Signed)
Pt here with htn.  Pt states he has been in bp of 150-170 and at times 190.  Pt has records with him and told to come here by his MD.  Does take meds.  No symptoms.  Pt also has DM.  States he wants it checked.  cbg in triage.
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
## 2019-08-03 ENCOUNTER — Inpatient Hospital Stay (HOSPITAL_COMMUNITY)
Admission: EM | Admit: 2019-08-03 | Discharge: 2019-08-05 | DRG: 101 | Disposition: A | Discharge status: Home / Self Care | Payer: Medicare Other | Attending: Internal Medicine | Admitting: Internal Medicine

## 2019-08-03 ENCOUNTER — Emergency Department (HOSPITAL_COMMUNITY): Payer: Medicare Other

## 2019-08-03 ENCOUNTER — Encounter: Payer: Self-pay | Admitting: Internal Medicine

## 2019-08-03 ENCOUNTER — Encounter (HOSPITAL_COMMUNITY): Payer: Self-pay | Admitting: Emergency Medicine

## 2019-08-03 DIAGNOSIS — I10 Essential (primary) hypertension: Secondary | ICD-10-CM | POA: Diagnosis present

## 2019-08-03 DIAGNOSIS — G9341 Metabolic encephalopathy: Secondary | ICD-10-CM | POA: Diagnosis present

## 2019-08-03 DIAGNOSIS — E876 Hypokalemia: Secondary | ICD-10-CM | POA: Diagnosis present

## 2019-08-03 DIAGNOSIS — R29810 Facial weakness: Secondary | ICD-10-CM | POA: Diagnosis present

## 2019-08-03 DIAGNOSIS — F40240 Claustrophobia: Secondary | ICD-10-CM | POA: Diagnosis present

## 2019-08-03 DIAGNOSIS — E119 Type 2 diabetes mellitus without complications: Secondary | ICD-10-CM | POA: Diagnosis present

## 2019-08-03 DIAGNOSIS — I6389 Other cerebral infarction: Secondary | ICD-10-CM | POA: Diagnosis not present

## 2019-08-03 DIAGNOSIS — F419 Anxiety disorder, unspecified: Secondary | ICD-10-CM | POA: Diagnosis not present

## 2019-08-03 DIAGNOSIS — R4701 Aphasia: Secondary | ICD-10-CM | POA: Diagnosis present

## 2019-08-03 DIAGNOSIS — Z20828 Contact with and (suspected) exposure to other viral communicable diseases: Secondary | ICD-10-CM | POA: Diagnosis present

## 2019-08-03 DIAGNOSIS — H53461 Homonymous bilateral field defects, right side: Secondary | ICD-10-CM | POA: Diagnosis present

## 2019-08-03 DIAGNOSIS — E7800 Pure hypercholesterolemia, unspecified: Secondary | ICD-10-CM | POA: Diagnosis present

## 2019-08-03 DIAGNOSIS — Z79890 Hormone replacement therapy (postmenopausal): Secondary | ICD-10-CM | POA: Diagnosis not present

## 2019-08-03 DIAGNOSIS — G4700 Insomnia, unspecified: Secondary | ICD-10-CM | POA: Diagnosis present

## 2019-08-03 DIAGNOSIS — G9340 Encephalopathy, unspecified: Secondary | ICD-10-CM | POA: Diagnosis not present

## 2019-08-03 DIAGNOSIS — Z794 Long term (current) use of insulin: Secondary | ICD-10-CM | POA: Diagnosis not present

## 2019-08-03 DIAGNOSIS — E785 Hyperlipidemia, unspecified: Secondary | ICD-10-CM | POA: Diagnosis not present

## 2019-08-03 DIAGNOSIS — Z8619 Personal history of other infectious and parasitic diseases: Secondary | ICD-10-CM | POA: Insufficient documentation

## 2019-08-03 DIAGNOSIS — R414 Neurologic neglect syndrome: Secondary | ICD-10-CM | POA: Diagnosis present

## 2019-08-03 DIAGNOSIS — E039 Hypothyroidism, unspecified: Secondary | ICD-10-CM | POA: Diagnosis present

## 2019-08-03 DIAGNOSIS — R569 Unspecified convulsions: Principal | ICD-10-CM | POA: Diagnosis present

## 2019-08-03 DIAGNOSIS — Z79899 Other long term (current) drug therapy: Secondary | ICD-10-CM | POA: Diagnosis not present

## 2019-08-03 DIAGNOSIS — I6359 Cerebral infarction due to unspecified occlusion or stenosis of other cerebral artery: Secondary | ICD-10-CM | POA: Diagnosis not present

## 2019-08-03 DIAGNOSIS — F05 Delirium due to known physiological condition: Secondary | ICD-10-CM | POA: Diagnosis present

## 2019-08-03 DIAGNOSIS — R471 Dysarthria and anarthria: Secondary | ICD-10-CM | POA: Diagnosis not present

## 2019-08-03 HISTORY — DX: Pure hypercholesterolemia, unspecified: E78.00

## 2019-08-03 HISTORY — DX: Type 2 diabetes mellitus without complications: E11.9

## 2019-08-03 LAB — DIFFERENTIAL
Abs Immature Granulocytes: 0.03 10*3/uL (ref 0.00–0.07)
Basophils Absolute: 0 10*3/uL (ref 0.0–0.1)
Basophils Relative: 0 %
Eosinophils Absolute: 0.1 10*3/uL (ref 0.0–0.5)
Eosinophils Relative: 1 %
Immature Granulocytes: 0 %
Lymphocytes Relative: 17 %
Lymphs Abs: 1.5 10*3/uL (ref 0.7–4.0)
Monocytes Absolute: 0.5 10*3/uL (ref 0.1–1.0)
Monocytes Relative: 5 %
Neutro Abs: 6.8 10*3/uL (ref 1.7–7.7)
Neutrophils Relative %: 77 %

## 2019-08-03 LAB — COMPREHENSIVE METABOLIC PANEL
ALT: 31 U/L (ref 0–44)
AST: 33 U/L (ref 15–41)
Albumin: 4.4 g/dL (ref 3.5–5.0)
Alkaline Phosphatase: 90 U/L (ref 38–126)
Anion gap: 13 (ref 5–15)
BUN: 6 mg/dL — ABNORMAL LOW (ref 8–23)
CO2: 23 mmol/L (ref 22–32)
Calcium: 9.4 mg/dL (ref 8.9–10.3)
Chloride: 105 mmol/L (ref 98–111)
Creatinine, Ser: 0.93 mg/dL (ref 0.61–1.24)
GFR calc Af Amer: >60 mL/min (ref >60)
GFR calc non Af Amer: >60 mL/min (ref >60)
Glucose, Bld: 140 mg/dL — ABNORMAL HIGH (ref 70–99)
Potassium: 3.3 mmol/L — ABNORMAL LOW (ref 3.5–5.1)
Sodium: 141 mmol/L (ref 135–145)
Total Bilirubin: 1.2 mg/dL (ref 0.3–1.2)
Total Protein: 7.8 g/dL (ref 6.5–8.1)

## 2019-08-03 LAB — CBC
HCT: 48.5 % (ref 39.0–52.0)
Hemoglobin: 17.1 g/dL — ABNORMAL HIGH (ref 13.0–17.0)
MCH: 31 pg (ref 26.0–34.0)
MCHC: 35.3 g/dL (ref 30.0–36.0)
MCV: 88 fL (ref 80.0–100.0)
Platelets: 214 10*3/uL (ref 150–400)
RBC: 5.51 MIL/uL (ref 4.22–5.81)
RDW: 12 % (ref 11.5–15.5)
WBC: 9 10*3/uL (ref 4.0–10.5)
nRBC: 0 % (ref 0.0–0.2)

## 2019-08-03 LAB — URINALYSIS, ROUTINE W REFLEX MICROSCOPIC
Bilirubin Urine: NEGATIVE
Glucose, UA: 150 mg/dL — AB
Hgb urine dipstick: NEGATIVE
Ketones, ur: 20 mg/dL — AB
Leukocytes,Ua: NEGATIVE
Nitrite: NEGATIVE
Protein, ur: NEGATIVE mg/dL
Specific Gravity, Urine: >1.046 — ABNORMAL HIGH (ref 1.005–1.030)
pH: 8 (ref 5.0–8.0)

## 2019-08-03 LAB — SARS CORONAVIRUS 2 BY RT PCR (HOSPITAL ORDER, PERFORMED IN ~~LOC~~ HOSPITAL LAB): SARS Coronavirus 2: NEGATIVE

## 2019-08-03 LAB — APTT: aPTT: 23 seconds — ABNORMAL LOW (ref 24–36)

## 2019-08-03 LAB — PROTIME-INR
INR: 1.1 (ref 0.8–1.2)
Prothrombin Time: 14.1 seconds (ref 11.4–15.2)

## 2019-08-03 LAB — GLUCOSE, CAPILLARY: Glucose-Capillary: 122 mg/dL — ABNORMAL HIGH (ref 70–99)

## 2019-08-03 LAB — I-STAT CHEM 8, ED
BUN: 6 mg/dL — ABNORMAL LOW (ref 8–23)
Calcium, Ion: 1.12 mmol/L — ABNORMAL LOW (ref 1.15–1.40)
Chloride: 102 mmol/L (ref 98–111)
Creatinine, Ser: 0.7 mg/dL (ref 0.61–1.24)
Glucose, Bld: 135 mg/dL — ABNORMAL HIGH (ref 70–99)
HCT: 49 % (ref 39.0–52.0)
Hemoglobin: 16.7 g/dL (ref 13.0–17.0)
Potassium: 3.3 mmol/L — ABNORMAL LOW (ref 3.5–5.1)
Sodium: 145 mmol/L (ref 135–145)
TCO2: 28 mmol/L (ref 22–32)

## 2019-08-03 LAB — CBG MONITORING, ED: Glucose-Capillary: 125 mg/dL — ABNORMAL HIGH (ref 70–99)

## 2019-08-03 LAB — ETHANOL: Alcohol, Ethyl (B): <10 mg/dL (ref <10)

## 2019-08-03 LAB — RAPID URINE DRUG SCREEN, HOSP PERFORMED
Amphetamines: NOT DETECTED
Barbiturates: NOT DETECTED
Benzodiazepines: POSITIVE — AB
Cocaine: NOT DETECTED
Opiates: POSITIVE — AB
Tetrahydrocannabinol: NOT DETECTED

## 2019-08-03 LAB — TSH: TSH: 1.48 u[IU]/mL (ref 0.350–4.500)

## 2019-08-03 MED ORDER — ACETAMINOPHEN 160 MG/5ML PO SOLN: 650.0000 mg | ORAL | Status: DC | PRN

## 2019-08-03 MED ORDER — LORAZEPAM 2 MG/ML IJ SOLN
2.0000 mg | Freq: Once | INTRAMUSCULAR | Status: AC
  Administered 2019-08-04: 2 mg via INTRAVENOUS
  Filled 2019-08-03: qty 1

## 2019-08-03 MED ORDER — INSULIN ASPART 100 UNIT/ML ~~LOC~~ SOLN: 0.0000 [IU] | Freq: Every day | SUBCUTANEOUS | Status: DC

## 2019-08-03 MED ORDER — STROKE: EARLY STAGES OF RECOVERY BOOK
Freq: Once | Status: AC
  Administered 2019-08-03: 21:00:00
  Filled 2019-08-03: qty 1

## 2019-08-03 MED ORDER — ROSUVASTATIN CALCIUM 20 MG PO TABS
40.0000 mg | ORAL_TABLET | Freq: Every day | ORAL | Status: DC
  Administered 2019-08-03 – 2019-08-04 (×2): 40 mg via ORAL
  Filled 2019-08-03 (×2): qty 2

## 2019-08-03 MED ORDER — ACETAMINOPHEN 650 MG RE SUPP: 650.0000 mg | RECTAL | Status: DC | PRN

## 2019-08-03 MED ORDER — METOPROLOL SUCCINATE ER 100 MG PO TB24
100.0000 mg | ORAL_TABLET | Freq: Every day | ORAL | Status: DC
  Administered 2019-08-03 – 2019-08-05 (×3): 100 mg via ORAL
  Filled 2019-08-03 (×3): qty 1

## 2019-08-03 MED ORDER — ACETAMINOPHEN 325 MG PO TABS
650.0000 mg | ORAL_TABLET | ORAL | Status: DC | PRN
  Administered 2019-08-04: 650 mg via ORAL
  Filled 2019-08-03: qty 2

## 2019-08-03 MED ORDER — IOHEXOL 350 MG/ML SOLN
100.0000 mL | Freq: Once | INTRAVENOUS | Status: AC | PRN
  Administered 2019-08-03: 100 mL via INTRAVENOUS

## 2019-08-03 MED ORDER — SENNOSIDES-DOCUSATE SODIUM 8.6-50 MG PO TABS: 1.0000 | ORAL_TABLET | Freq: Every evening | ORAL | Status: DC | PRN

## 2019-08-03 MED ORDER — INSULIN GLARGINE 100 UNIT/ML ~~LOC~~ SOLN
20.0000 [IU] | Freq: Every day | SUBCUTANEOUS | Status: DC
  Administered 2019-08-04 – 2019-08-05 (×2): 20 [IU] via SUBCUTANEOUS
  Filled 2019-08-03 (×2): qty 0.2

## 2019-08-03 MED ORDER — LEVOTHYROXINE SODIUM 75 MCG PO TABS
175.0000 ug | ORAL_TABLET | Freq: Every day | ORAL | Status: DC
  Administered 2019-08-04 – 2019-08-05 (×2): 175 ug via ORAL
  Filled 2019-08-03 (×2): qty 1

## 2019-08-03 MED ORDER — ASPIRIN 325 MG PO TABS
325.0000 mg | ORAL_TABLET | Freq: Every day | ORAL | Status: DC
  Filled 2019-08-03: qty 1

## 2019-08-03 MED ORDER — ENOXAPARIN SODIUM 40 MG/0.4ML ~~LOC~~ SOLN
40.0000 mg | SUBCUTANEOUS | Status: DC
  Administered 2019-08-03 – 2019-08-04 (×2): 40 mg via SUBCUTANEOUS
  Filled 2019-08-03 (×2): qty 0.4

## 2019-08-03 MED ORDER — ASPIRIN 300 MG RE SUPP: 300.0000 mg | Freq: Every day | RECTAL | Status: DC

## 2019-08-03 MED ORDER — INSULIN ASPART 100 UNIT/ML ~~LOC~~ SOLN
0.0000 [IU] | Freq: Three times a day (TID) | SUBCUTANEOUS | Status: DC
  Administered 2019-08-04 – 2019-08-05 (×3): 2 [IU] via SUBCUTANEOUS

## 2019-08-03 NOTE — Progress Notes (Signed)
Pt came over after retry of MR Brain scan.  Pt would not hold still,kept trying to crawl off the stretcher, pulled his IV out, and was not able to answer questions or respond appropriately.  Pt sent back to the ER

## 2019-08-03 NOTE — Procedures (Signed)
Patient Name: Dan [name]  MRN: [id number]  Epilepsy Attending: Lora Havens  Referring Physician/Provider: Dr Roland Rack Date: 08/03/2019 Duration: 22.33mins  Patient history: 73yo m with ams. EEG to evaluate for seizure  Level of alertness: awake  AEDs during EEG study: None  Technical aspects: This EEG study was done with scalp electrodes positioned according to the 10-20 International system of electrode placement. Electrical activity was acquired at a sampling rate of 500Hz  and reviewed with a high frequency filter of 70Hz  and a low frequency filter of 1Hz . EEG data were recorded continuously and digitally stored.   DESCRIPTION: No clear posterior dominant rhythm was seen. EEG showed continuous generalized 3-5hz  polymorphic theta-delta slowing at times with triphasic morphology. Hyperventilation and photic stimulation were not performed.  ABNORMALITY - Continuous slow, generalized  IMPRESSION: This study is suggestive of moderate diffuse encephalopathy, non specific to etiology. No seizures or epileptiform discharges were seen throughout the recording.  [name] [name]

## 2019-08-03 NOTE — ED Provider Notes (Signed)
Blood pressure (!) 174/88, pulse 77, temperature 98.4 F (36.9 C), temperature source Oral, resp. rate 11, SpO2 98 %.  Assuming care from Dr. Gilford Raid.  In short, Dan [name] is a 73 y.o. male with a chief complaint of Code Stroke .  Refer to the original H&P for additional details.  The current plan of care is to f/u on CXR and UA with admit.  CXR reviewed with no acute findings. UA pending. MRI pending. EEG tech at bedside now.   Discussed patient's case with IM teaching service to request admission. Patient and family (if present) updated with plan. Care transferred to Medicine service.   EKG Interpretation  Date/Time:  Wednesday August 03 2019 14:32:59 EST Ventricular Rate:  95 PR Interval:    QRS Duration: 85 QT Interval:  319 QTC Calculation: 401 R Axis:   81 Text Interpretation: Sinus rhythm Borderline right axis deviation Nonspecific repol abnormality, lateral leads No old tracing to compare Confirmed by Isla Pence [phone]) on 08/03/2019 2:45:19 PM      I reviewed all nursing notes, vitals, pertinent old records, EKGs, labs, imaging (as available).     Margette Fast, MD 08/03/19 [phone]

## 2019-08-03 NOTE — Progress Notes (Signed)
EEG complete - results pending 

## 2019-08-03 NOTE — ED Notes (Signed)
ED TO INPATIENT HANDOFF REPORT  ED Nurse Name and Phone #:Kim Stephanie Coup Name/Age/Gender Dan [name] 73 y.o. male Room/Bed: 015C/015C  Code Status   Code Status: Full Code  Home/SNF/Other Home Patient oriented to: self and place Is this baseline? Yes   Triage Complete: Triage complete  Chief Complaint code stroke  Triage Note Pt arrives via EMS from home as Code Stroke, LSN 20:00 last night. Wife reports pt was having trouble talking.    Allergies Allergies  Allergen Reactions  . Floxin I.V. In Dextrose 5% [Ofloxacin] Other (See Comments)    Central Nervous System effects  . Flagyl [Metronidazole] Rash  . Sulfa Antibiotics Rash    Level of Care/Admitting Diagnosis ED Disposition    ED Disposition Condition [facility] Area: [place] [[id number]]  Level of Care: Telemetry Medical [104]  Covid Evaluation: Confirmed COVID Negative  Diagnosis: Encephalopathy acute [id number]  Admitting Physician: Aline Brochure  Attending Physician: Bartholomew Crews [phone]  Estimated length of stay: past midnight tomorrow  Certification:: I certify this patient will need inpatient services for at least 2 midnights  PT Class (Do Not Modify): Inpatient [101]  PT Acc Code (Do Not Modify): Private [1]       B Medical/Surgery History Past Medical History:  Diagnosis Date  . Bladder cancer ([place])   . Diabetes mellitus without complication ([place])   . High cholesterol   . Hypertension    Past Surgical History:  Procedure Laterality Date  . BLADDER SURGERY    . CHOLECYSTECTOMY    . EYE SURGERY       A IV Location/Drains/Wounds Patient Lines/Drains/Airways Status   Active Line/Drains/Airways    Name:   Placement date:   Placement time:   Site:   Days:   Peripheral IV 08/03/19 Left Antecubital   08/03/19    1415    Antecubital   less than 1   Peripheral IV 08/03/19 Right Antecubital   08/03/19    1400    Antecubital   less than 1           Intake/Output Last 24 hours No intake or output data in the 24 hours ending 08/03/19 1928  Labs/Imaging Results for orders placed or performed during the hospital encounter of 08/03/19 (from the past 48 hour(s))  Ethanol     Status: None   Collection Time: 08/03/19  2:11 PM  Result Value Ref Range   Alcohol, Ethyl (B) <10 <10 mg/dL    Comment: (NOTE) Lowest detectable limit for serum alcohol is 10 mg/dL. For medical purposes only. Performed at [facility], [place] [address], [place], [place] [address]   Protime-INR     Status: None   Collection Time: 08/03/19  2:11 PM  Result Value Ref Range   Prothrombin Time 14.1 11.4 - 15.2 seconds   INR 1.1 0.8 - 1.2    Comment: (NOTE) INR goal varies based on device and disease states. Performed at [facility], [place] [address] [place], [place] [address]   APTT     Status: Abnormal   Collection Time: 08/03/19  2:11 PM  Result Value Ref Range   aPTT 23 (L) 24 - 36 seconds    Comment: Performed at [facility] [address] [place], [place] [address]  CBC     Status: Abnormal   Collection Time: 08/03/19  2:11 PM  Result Value Ref Range   WBC 9.0 4.0 -  10.5 K/uL   RBC 5.51 4.22 - 5.81 MIL/uL   Hemoglobin 17.1 (H) 13.0 - 17.0 g/dL   HCT 48.5 39.0 - 52.0 %   MCV 88.0 80.0 - 100.0 fL   MCH 31.0 26.0 - 34.0 pg   MCHC 35.3 30.0 - 36.0 g/dL   RDW 12.0 11.5 - 15.5 %   Platelets 214 150 - 400 K/uL   nRBC 0.0 0.0 - 0.2 %    Comment: Performed at [facility] [address], [place], [place] [address]  Differential     Status: None   Collection Time: 08/03/19  2:11 PM  Result Value Ref Range   Neutrophils Relative % 77 %   Neutro Abs 6.8 1.7 - 7.7 K/uL   Lymphocytes Relative 17 %   Lymphs Abs 1.5 0.7 - 4.0 K/uL   Monocytes Relative 5 %   Monocytes Absolute 0.5 0.1 - 1.0 K/uL   Eosinophils Relative 1 %   Eosinophils Absolute 0.1 0.0 - 0.5 K/uL   Basophils Relative 0 %   Basophils Absolute 0.0 0.0  - 0.1 K/uL   Immature Granulocytes 0 %   Abs Immature Granulocytes 0.03 0.00 - 0.07 K/uL    Comment: Performed at [facility] [address], [place], [place] [address]  Comprehensive metabolic panel     Status: Abnormal   Collection Time: 08/03/19  2:11 PM  Result Value Ref Range   Sodium 141 135 - 145 mmol/L   Potassium 3.3 (L) 3.5 - 5.1 mmol/L   Chloride 105 98 - 111 mmol/L   CO2 23 22 - 32 mmol/L   Glucose, Bld 140 (H) 70 - 99 mg/dL   BUN 6 (L) 8 - 23 mg/dL   Creatinine, Ser 0.93 0.61 - 1.24 mg/dL   Calcium 9.4 8.9 - 10.3 mg/dL   Total Protein 7.8 6.5 - 8.1 g/dL   Albumin 4.4 3.5 - 5.0 g/dL   AST 33 15 - 41 U/L   ALT 31 0 - 44 U/L   Alkaline Phosphatase 90 38 - 126 U/L   Total Bilirubin 1.2 0.3 - 1.2 mg/dL   GFR calc non Af Amer >60 >60 mL/min   GFR calc Af Amer >60 >60 mL/min   Anion gap 13 5 - 15    Comment: Performed at [facility], [place] [address] [place],  [address]  I-stat chem 8, ED     Status: Abnormal   Collection Time: 08/03/19  2:17 PM  Result Value Ref Range   Sodium 145 135 - 145 mmol/L   Potassium 3.3 (L) 3.5 - 5.1 mmol/L   Chloride 102 98 - 111 mmol/L   BUN 6 (L) 8 - 23 mg/dL   Creatinine, Ser 0.70 0.61 - 1.24 mg/dL   Glucose, Bld 135 (H) 70 - 99 mg/dL   Calcium, Ion 1.12 (L) 1.15 - 1.40 mmol/L   TCO2 28 22 - 32 mmol/L   Hemoglobin 16.7 13.0 - 17.0 g/dL   HCT 49.0 39.0 - 52.0 %  CBG monitoring, ED     Status: Abnormal   Collection Time: 08/03/19  2:31 PM  Result Value Ref Range   Glucose-Capillary 125 (H) 70 - 99 mg/dL  SARS Coronavirus 2 by RT PCR (hospital order, performed in [facility] hospital lab) Nasopharyngeal Nasopharyngeal Swab     Status: None   Collection Time: 08/03/19  2:32 PM   Specimen: Nasopharyngeal Swab  Result Value Ref Range   SARS Coronavirus 2  NEGATIVE NEGATIVE    Comment: (NOTE) SARS-CoV-2 target nucleic acids are NOT DETECTED. The SARS-CoV-2 RNA is generally detectable in upper and lower respiratory  specimens during the acute phase of infection. The lowest concentration of SARS-CoV-2 viral copies this assay can detect is 250 copies / mL. A negative result does not preclude SARS-CoV-2 infection and should not be used as the sole basis for treatment or other patient management decisions.  A negative result may occur with improper specimen collection / handling, submission of specimen other than nasopharyngeal swab, presence of viral mutation(s) within the areas targeted by this assay, and inadequate number of viral copies (<250 copies / mL). A negative result must be combined with clinical observations, patient history, and epidemiological information. Fact Sheet for Patients:   StrictlyIdeas.no Fact Sheet for Healthcare Providers: BankingDealers.co.za This test is not yet approved or cleared  by the [place] FDA and has been authorized for detection and/or diagnosis of SARS-CoV-2 by FDA under an Emergency Use Authorization (EUA).  This EUA will remain in effect (meaning this test can be used) for the duration of the COVID-19 declaration under Section 564(b)(1) of the Act, 21 U.S.C. section 360bbb-3(b)(1), unless the authorization is terminated or revoked sooner. Performed at [facility], [place] [address] [place], [place] [address]    Ct Angio Head W Or Wo Contrast  Result Date: 08/03/2019 CLINICAL DATA:  Left sided weakness.  Ataxia. EXAM: CT ANGIOGRAPHY HEAD AND NECK CT PERFUSION BRAIN TECHNIQUE: Multidetector CT imaging of the head and neck was performed using the standard protocol during bolus administration of intravenous contrast. Multiplanar CT image reconstructions and MIPs were obtained to evaluate the vascular anatomy. Carotid stenosis measurements (when applicable) are obtained utilizing NASCET criteria, using the distal internal carotid diameter as the denominator. Multiphase CT imaging of the brain was performed  following IV bolus contrast injection. Subsequent parametric perfusion maps were calculated using RAPID software. CONTRAST:  131mL OMNIPAQUE IOHEXOL 350 MG/ML SOLN COMPARISON:  CT head 08/03/2019 FINDINGS: CTA NECK FINDINGS Aortic arch: Standard branching. Imaged portion shows no evidence of aneurysm or dissection. No significant stenosis of the major arch vessel origins. Right carotid system: Atherosclerotic disease at the carotid bifurcation. Less than 25% diameter stenosis right internal carotid artery. Moderate to severe stenosis origin of right external carotid artery. Left carotid system: Atherosclerotic disease of the carotid bifurcation. 50% diameter stenosis proximal left internal carotid artery. Vertebral arteries: Calcific plaque and moderate stenosis origin of right vertebral artery. Moderate stenosis distal right vertebral artery. Moderate stenosis distal left vertebral artery. Origin of left vertebral artery widely patent. Skeleton: Cervical spine degenerative change. No acute skeletal abnormality. Other neck: Negative for mass or adenopathy in the neck. Upper chest: Lung apices clear bilaterally. Review of the MIP images confirms the above findings CTA HEAD FINDINGS Anterior circulation: Atherosclerotic calcification in the cavernous carotid bilaterally. Mild stenosis supraclinoid internal carotid artery on the left. No significant right carotid stenosis. Anterior and middle cerebral arteries patent bilaterally without stenosis or occlusion. Posterior circulation: Moderate stenosis of the vertebral artery bilaterally at the skull base. PICA patent bilaterally. Basilar widely patent. AICA, superior cerebellar, posterior cerebral arteries patent without significant stenosis bilaterally. Venous sinuses: Limited venous contrast. Anatomic variants: None Review of the MIP images confirms the above findings CT Brain Perfusion Findings: ASPECTS: 10 CBF (<30%) Volume: 79mL Perfusion (Tmax>6.0s) volume: 75mL  Mismatch Volume: 34mL Infarction Location:None IMPRESSION: 1. CT perfusion negative for acute infarct or ischemia 2. Less than 25% diameter stenosis proximal  right internal carotid artery. 50% diameter stenosis proximal left internal carotid artery 3. Moderate stenosis at the origin and distal right vertebral artery. Moderate stenosis distal left vertebral artery 4. Negative for intracranial large vessel occlusion. Electronically Signed   By: Franchot Gallo M.D.   On: 08/03/2019 14:46   Ct Angio Neck W Or Wo Contrast  Result Date: 08/03/2019 CLINICAL DATA:  Left sided weakness.  Ataxia. EXAM: CT ANGIOGRAPHY HEAD AND NECK CT PERFUSION BRAIN TECHNIQUE: Multidetector CT imaging of the head and neck was performed using the standard protocol during bolus administration of intravenous contrast. Multiplanar CT image reconstructions and MIPs were obtained to evaluate the vascular anatomy. Carotid stenosis measurements (when applicable) are obtained utilizing NASCET criteria, using the distal internal carotid diameter as the denominator. Multiphase CT imaging of the brain was performed following IV bolus contrast injection. Subsequent parametric perfusion maps were calculated using RAPID software. CONTRAST:  186mL OMNIPAQUE IOHEXOL 350 MG/ML SOLN COMPARISON:  CT head 08/03/2019 FINDINGS: CTA NECK FINDINGS Aortic arch: Standard branching. Imaged portion shows no evidence of aneurysm or dissection. No significant stenosis of the major arch vessel origins. Right carotid system: Atherosclerotic disease at the carotid bifurcation. Less than 25% diameter stenosis right internal carotid artery. Moderate to severe stenosis origin of right external carotid artery. Left carotid system: Atherosclerotic disease of the carotid bifurcation. 50% diameter stenosis proximal left internal carotid artery. Vertebral arteries: Calcific plaque and moderate stenosis origin of right vertebral artery. Moderate stenosis distal right vertebral  artery. Moderate stenosis distal left vertebral artery. Origin of left vertebral artery widely patent. Skeleton: Cervical spine degenerative change. No acute skeletal abnormality. Other neck: Negative for mass or adenopathy in the neck. Upper chest: Lung apices clear bilaterally. Review of the MIP images confirms the above findings CTA HEAD FINDINGS Anterior circulation: Atherosclerotic calcification in the cavernous carotid bilaterally. Mild stenosis supraclinoid internal carotid artery on the left. No significant right carotid stenosis. Anterior and middle cerebral arteries patent bilaterally without stenosis or occlusion. Posterior circulation: Moderate stenosis of the vertebral artery bilaterally at the skull base. PICA patent bilaterally. Basilar widely patent. AICA, superior cerebellar, posterior cerebral arteries patent without significant stenosis bilaterally. Venous sinuses: Limited venous contrast. Anatomic variants: None Review of the MIP images confirms the above findings CT Brain Perfusion Findings: ASPECTS: 10 CBF (<30%) Volume: 15mL Perfusion (Tmax>6.0s) volume: 61mL Mismatch Volume: 21mL Infarction Location:None IMPRESSION: 1. CT perfusion negative for acute infarct or ischemia 2. Less than 25% diameter stenosis proximal right internal carotid artery. 50% diameter stenosis proximal left internal carotid artery 3. Moderate stenosis at the origin and distal right vertebral artery. Moderate stenosis distal left vertebral artery 4. Negative for intracranial large vessel occlusion. Electronically Signed   By: Franchot Gallo M.D.   On: 08/03/2019 14:46   Ct Cerebral Perfusion W Contrast  Result Date: 08/03/2019 CLINICAL DATA:  Left sided weakness.  Ataxia. EXAM: CT ANGIOGRAPHY HEAD AND NECK CT PERFUSION BRAIN TECHNIQUE: Multidetector CT imaging of the head and neck was performed using the standard protocol during bolus administration of intravenous contrast. Multiplanar CT image reconstructions and MIPs  were obtained to evaluate the vascular anatomy. Carotid stenosis measurements (when applicable) are obtained utilizing NASCET criteria, using the distal internal carotid diameter as the denominator. Multiphase CT imaging of the brain was performed following IV bolus contrast injection. Subsequent parametric perfusion maps were calculated using RAPID software. CONTRAST:  157mL OMNIPAQUE IOHEXOL 350 MG/ML SOLN COMPARISON:  CT head 08/03/2019 FINDINGS: CTA NECK FINDINGS Aortic arch: Standard branching.  Imaged portion shows no evidence of aneurysm or dissection. No significant stenosis of the major arch vessel origins. Right carotid system: Atherosclerotic disease at the carotid bifurcation. Less than 25% diameter stenosis right internal carotid artery. Moderate to severe stenosis origin of right external carotid artery. Left carotid system: Atherosclerotic disease of the carotid bifurcation. 50% diameter stenosis proximal left internal carotid artery. Vertebral arteries: Calcific plaque and moderate stenosis origin of right vertebral artery. Moderate stenosis distal right vertebral artery. Moderate stenosis distal left vertebral artery. Origin of left vertebral artery widely patent. Skeleton: Cervical spine degenerative change. No acute skeletal abnormality. Other neck: Negative for mass or adenopathy in the neck. Upper chest: Lung apices clear bilaterally. Review of the MIP images confirms the above findings CTA HEAD FINDINGS Anterior circulation: Atherosclerotic calcification in the cavernous carotid bilaterally. Mild stenosis supraclinoid internal carotid artery on the left. No significant right carotid stenosis. Anterior and middle cerebral arteries patent bilaterally without stenosis or occlusion. Posterior circulation: Moderate stenosis of the vertebral artery bilaterally at the skull base. PICA patent bilaterally. Basilar widely patent. AICA, superior cerebellar, posterior cerebral arteries patent without  significant stenosis bilaterally. Venous sinuses: Limited venous contrast. Anatomic variants: None Review of the MIP images confirms the above findings CT Brain Perfusion Findings: ASPECTS: 10 CBF (<30%) Volume: 60mL Perfusion (Tmax>6.0s) volume: 40mL Mismatch Volume: 54mL Infarction Location:None IMPRESSION: 1. CT perfusion negative for acute infarct or ischemia 2. Less than 25% diameter stenosis proximal right internal carotid artery. 50% diameter stenosis proximal left internal carotid artery 3. Moderate stenosis at the origin and distal right vertebral artery. Moderate stenosis distal left vertebral artery 4. Negative for intracranial large vessel occlusion. Electronically Signed   By: Franchot Gallo M.D.   On: 08/03/2019 14:46   Dg Chest Portable 1 View  Result Date: 08/03/2019 CLINICAL DATA:  Mental status change. EXAM: PORTABLE CHEST 1 VIEW COMPARISON:  05/10/2014 FINDINGS: Heart size is enlarged. The lungs are clear. There is no pneumothorax. No acute osseous abnormality. Aortic calcifications are noted. IMPRESSION: No active disease. Electronically Signed   By: Constance Holster M.D.   On: 08/03/2019 15:34   Ct Head Code Stroke Wo Contrast  Result Date: 08/03/2019 CLINICAL DATA:  Code stroke.  Ataxia.  Rule out stroke. EXAM: CT HEAD WITHOUT CONTRAST TECHNIQUE: Contiguous axial images were obtained from the base of the skull through the vertex without intravenous contrast. COMPARISON:  CT head 11/10/2005 FINDINGS: Brain: No evidence of acute infarction, hemorrhage, hydrocephalus, extra-axial collection or mass lesion/mass effect. Vascular: Negative for hyperdense vessel Skull: Negative Sinuses/Orbits: Negative Other: None ASPECTS ([place] Stroke Program Early CT Score) - Ganglionic level infarction (caudate, lentiform nuclei, internal capsule, insula, M1-M3 cortex): 7 - Supraganglionic infarction (M4-M6 cortex): 3 Total score (0-10 with 10 being normal): 10 IMPRESSION: 1. No acute abnormality 2.  ASPECTS is 10 3. These results were called by telephone at the time of interpretation on 08/03/2019 at 2:30 pm to provider Leonel Ramsay, who verbally acknowledged these results. Electronically Signed   By: Franchot Gallo M.D.   On: 08/03/2019 14:30    Pending Labs Unresulted Labs (From admission, onward)    Start     Ordered   08/04/19 0500  Hemoglobin A1c  Tomorrow morning,   R     08/03/19 1700   08/04/19 0500  Lipid panel  Tomorrow morning,   R    Comments: Fasting    08/03/19 1700   08/04/19 [id number]  Basic metabolic panel  Tomorrow morning,   R  08/03/19 1831   08/04/19 0500  CBC  Tomorrow morning,   R     08/03/19 1831   08/03/19 1635  TSH  Once,   STAT     08/03/19 1634   08/03/19 1411  Urine rapid drug screen (hosp performed)  ONCE - STAT,   STAT     08/03/19 1411   08/03/19 1411  Urinalysis, Routine w reflex microscopic  ONCE - STAT,   STAT     08/03/19 1411          Vitals/Pain Today's Vitals   08/03/19 1445 08/03/19 1500 08/03/19 1515 08/03/19 1744  BP: (!) 177/76 (!) 174/88 (!) 189/79 (!) 186/88  Pulse: 87 77 99 89  Resp: 11 11 19 14   Temp:      TempSrc:      SpO2: 99% 98% 94% 98%  PainSc:        Isolation Precautions No active isolations  Medications Medications  levothyroxine (SYNTHROID) tablet 175 mcg (has no administration in time range)  metoprolol succinate (TOPROL-XL) 24 hr tablet 100 mg (100 mg Oral Given 08/03/19 1913)  rosuvastatin (CRESTOR) tablet 40 mg (40 mg Oral Given 08/03/19 1915)   stroke: mapping our early stages of recovery book (has no administration in time range)  acetaminophen (TYLENOL) tablet 650 mg (has no administration in time range)    Or  acetaminophen (TYLENOL) 160 MG/5ML solution 650 mg (has no administration in time range)    Or  acetaminophen (TYLENOL) suppository 650 mg (has no administration in time range)  senna-docusate (Senokot-S) tablet 1 tablet (has no administration in time range)  enoxaparin (LOVENOX) injection  40 mg (has no administration in time range)  aspirin suppository 300 mg (has no administration in time range)    Or  aspirin tablet 325 mg (has no administration in time range)  insulin glargine (LANTUS) injection 20 Units (has no administration in time range)  insulin aspart (novoLOG) injection 0-15 Units (has no administration in time range)  insulin aspart (novoLOG) injection 0-5 Units (has no administration in time range)  LORazepam (ATIVAN) injection 2 mg (has no administration in time range)  iohexol (OMNIPAQUE) 350 MG/ML injection 100 mL (100 mLs Intravenous Contrast Given 08/03/19 1433)    Mobility walks Low fall risk   Focused Assessments Neuro Assessment Handoff:  Swallow screen pass? Yes  Cardiac Rhythm: Normal sinus rhythm NIH Stroke Scale ( + Modified Stroke Scale Criteria)  Interval: Initial Level of Consciousness (1a.)   : Alert, keenly responsive LOC Questions (1b. )   +: Answers neither question correctly LOC Commands (1c. )   + : Performs both tasks correctly Best Gaze (2. )  +: Normal Visual (3. )  +: Complete hemianopia Facial Palsy (4. )    : Minor paralysis Motor Arm, Left (5a. )   +: No drift Motor Arm, Right (5b. )   +: No drift Motor Leg, Left (6a. )   +: No drift Motor Leg, Right (6b. )   +: No drift Limb Ataxia (7. ): Absent Sensory (8. )   +: Normal, no sensory loss Best Language (9. )   +: Mild-to-moderate aphasia Dysarthria (10. ): Normal Extinction/Inattention (11.)   +: No Abnormality Modified SS Total  +: 5 Complete NIHSS TOTAL: 10 Last date known well: 08/02/19 Last time known well: 2000 Neuro Assessment: Exceptions to WDL Neuro Checks:   Initial (08/03/19 1430)  Last Documented NIHSS Modified Score: 5 (08/03/19 1840) Has TPA been given? No If patient is  a Neuro Trauma and patient is going to OR before floor call report to [facility] nurse: [phone] or [phone]     R Recommendations: See Admitting Provider Note  Report  given to:   Additional Notes:

## 2019-08-03 NOTE — Code Documentation (Signed)
73 yo male coming from home with complaints of "garbled speech" that was noted this morning by wife after the patient woke up. LKW reported to be 2000 yesterday. Pt arrived with [facility]. Code Stroke activated prior to arrival. Stroke Team met patient upon arrival to ED. Initial NIHSS 9 - right hemianopia, right facial droop, aphasia, and bilateral leg drift. CT head completed. Not deemed tPA candidate due to being outside window. IV 18 Gauge placed in left AC. CTA/CTP completed. Not an IR candidate. Patient taken back to ED 15. Placed on the cardiac monitor. Handoff given to Burman Nieves, Therapist, sports.

## 2019-08-03 NOTE — ED Notes (Signed)
EEG at bedside at this time.

## 2019-08-03 NOTE — ED Triage Notes (Signed)
Pt arrives via EMS from home as Code Stroke, LSN 20:00 last night. Wife reports pt was having trouble talking.

## 2019-08-03 NOTE — H&P (Signed)
Date: 08/03/2019               Patient Name:  Dan [name] MRN: [id number]  DOB: May 30, 1946 Age / Sex: 73 y.o., male   PCP: No primary care provider on file.         Medical Service: Internal Medicine Teaching Service         Attending Physician: Dr. Laverta Baltimore, Wonda Olds, MD    First Contact: Dr. Court Joy Pager: [phone]  Second Contact: Dr. Don Broach Pager: [phone]       After Hours (After 5p/  First Contact Pager: [phone]  weekends / holidays): Second Contact Pager: [phone]   Chief Complaint: Code stroke  History of Present Illness: Dan [name] is a 73 year old male with HTN, HLD, T2DM, and insomnia who presented to ED as a code stroke.  History is obtained from chart review , pass off from providers, and information from patients wife. Last known normal 8 PM on 11/24.  Patient sleeps in separate bedroom and his wife heard him on 2 AM.  She found him in the room hitting his hand against the wall and patient told her he was trying to open a door. No door was there. She got him back in bed, but heard him getting up all night.  She found him trying to urinate , but he couldn't. Found him urinating on the couch another time.  In the morning when she woke up she found him using the commode , he did not wipe his self , and started walking down the hall.  Initially she thought the patient was sleepwalking, but she says he was not.  He repeatedly told her "go on".  She said this is very abnormal and he has never had a history of sleepwalking.He has been on Ambien for 17 years and never had a episode like this she says.  For the past month few weeks the patient has been cleaning out a rental property close to their home.  He has had to clean a lot of trash and place roach bombs. In addition he has called an exterminator to help.  The patient's wife also says he had a fall a week ago while they were cleaning the rental property.  The patient was standing on a chair and fell on his shoulder, but felt fine  afterwards except for a bruise.  She says he has been extra thirsty, working a lot , and not taking time to eat.  Otherwise, the patient has had no recent changes in his medications or recent illnesses she is aware of.    Patient refused MRI , but responsive on exam. We were able to interview patient, but could not add to the history.He was alert, oriented to person, place, but not time. He answered "potatoe" to time. He would answer "potatoe" to other questions. He was able to tell me his daughters name who was in the room. He knew she was there. He endorsed claustrophobia. He agreed to go back to MRI with Ativan.    Meds:  Current Meds  Medication Sig  . ALPRAZolam (XANAX) 0.5 MG tablet Take 0.5 mg by mouth daily as needed.  Marland Kitchen amLODipine-benazepril (LOTREL) 10-40 MG capsule Take 1 capsule by mouth daily.  . benazepril (LOTENSIN) 20 MG tablet Take 20 mg by mouth daily.  Marland Kitchen dicyclomine (BENTYL) 20 MG tablet Take 20 mg by mouth 3 (three) times daily.  . EUTHYROX 175 MCG tablet Take 175 mcg by mouth every morning.  Marland Kitchen  furosemide (LASIX) 20 MG tablet Take 20 mg by mouth daily as needed.  . gabapentin (NEURONTIN) 400 MG capsule Take 400 mg by mouth 3 (three) times daily.  Marland Kitchen HYDROcodone-acetaminophen (NORCO/VICODIN) 5-325 MG tablet Take 1 tablet by mouth 2 (two) times daily as needed.  Marland Kitchen LANTUS SOLOSTAR 100 UNIT/ML Solostar Pen Inject 40 Units into the skin daily.  . metFORMIN (GLUCOPHAGE) 1000 MG tablet Take 1,000 mg by mouth 2 (two) times daily.  . metoprolol succinate (TOPROL-XL) 100 MG 24 hr tablet Take 100 mg by mouth daily.  Marland Kitchen OZEMPIC, 1 MG/DOSE, 2 MG/1.5ML SOPN Inject 0.75 mLs into the skin once a week.  . pantoprazole (PROTONIX) 40 MG tablet Take 40 mg by mouth daily.  . rosuvastatin (CRESTOR) 10 MG tablet Take 10 mg by mouth at bedtime.  Marland Kitchen zolpidem (AMBIEN) 10 MG tablet Take 10 mg by mouth at bedtime as needed for sleep.      Allergies: Allergies as of 08/03/2019 - Review Complete  08/03/2019  Allergen Reaction Noted  . Floxin i.v. in dextrose 5% [ofloxacin] Other (See Comments) 08/03/2019  . Flagyl [metronidazole] Rash 08/03/2019  . Sulfa antibiotics Rash 08/03/2019   No past medical history on file.  Family History: Unable to obtain due to altered mental status.   Social History: Unable to obtain due to altered mental status.   Review of Systems: Unable to obtain due to altered mental status.   Physical Exam: Blood pressure (!) 189/79, pulse 99, temperature 98.4 F (36.9 C), temperature source Oral, resp. rate 19, SpO2 94 %.  General: NAD, resting in bed HEENT: Clear anicteric.  Normocephalic / atraumatic Cardiovascular: Regular rate and rhythm no murmurs rubs or gallops Pulmonary: Auscultated anteriorly, clear to auscultation Abdominal: Normal bowel sounds, nontender Musculoskeletal: No edema, or abnormalities. Skin: No erythema , or rashes Psych: Cooperative , pleasant  Neuro:Mental Status: Patient is awake, alert, oriented to person, place, but not time Receptive and expressive aphasia  Cranial Nerves: II: Pupils equal, round, and reactive to light. Right hemianopsia III,IV, VI: EOMI without ptosis or diploplia.  V: Facial sensation is symmetric to light touch VII: Facial movement is symmetric.  VIII: hearing is intact to voice X: Uvula elevates symmetrically XI: Shoulder shrug is symmetric. XII: tongue is midline without atrophy or fasciculations.  Motor: 4/5 strength in RUE, 5/5 in LUE.  5/5 in  bilateral lower extremitiy  Sensory: Sensation is grossly intact bilateral UEs & LEs Deep Tendon Reflexes: 1+ patellar reflex, 2+ brachioradialis and biceps.  Cerebellar: Finger-Nose and Heel-Shin, unable to follow directions   Assessment & Plan by Problem: Active Problems:   * No active hospital problems. *  Dan [name] is a 73 year old male with HTN, HLD, T2DM, and insomnia who presented to ED as a code stroke.  #Encephalopathy  - CT imaging  negative, but per neuro fairly consistent with M2 stroke. Admitted for further work-up. EEG negative. Patient taken for MRI and became agitated. Plan to try again with Ativan.  - Ordered ativan, MRI pending.  - ASA 325  - Crestor 40 mg  - F/u hemoglobin A1c and lipid profile -Telemetry monitoring - Frequent neuro checks -F/u UA  #Hypothyrodism - Continue Synthroid - TSH  #Type II DM   - currently NPO; ordered SSI and half of home Lantus to start tomorrow morning   #HTN  - permissive HTN 220/120    Dispo: Admit patient to Inpatient with expected length of stay greater than 2 midnights.  Signed:  [name], MD PGY1  [phone]  

## 2019-08-03 NOTE — ED Notes (Signed)
Pt stated that he needed to pee. Pt placed on condom catheter and instructed that he could pee.

## 2019-08-03 NOTE — ED Notes (Signed)
Pt continues to sit up and slide to end of stretcher.  Pt easily redirected back on stretcher.   Pt also rubbing his forehead and when asked pt will say his head is hurting

## 2019-08-03 NOTE — ED Notes (Signed)
Port chest x-ray being obtained at this time

## 2019-08-03 NOTE — ED Provider Notes (Signed)
[facility] DEPART[facility] Provider Note   CSN: [id number] Arrival date & time: 08/03/19  1407  An emergency department physician performed an initial assessment on this suspected stroke patient at 1407.  History   Chief Complaint Chief Complaint  Patient presents with   Code Stroke    HPI Dan [name] is a 73 y.o. male.     Pt presents to the ED today with AMS.  The pt has a hx of dm, htn, and high cholesterol.  His wife said he was nl at 2000 hours on 08/02/19.  The pt's wife said she woke up around 0200 this morning.  She found him in the living room beating on the wall.  He was restless last night and was getting up and down.  He was urinating all over the room and the couch.  He had a bowel movement and then did not wipe.  He is not giving any kind of hx.  He will just say yes or no.  He is moving all 4 extremities.  He was brought in as a code stroke.      No past medical history on file.  There are no active problems to display for this patient.     11/10/2009 COLONIC POLYPS, ADENOMATOUS, HX OF   2010 Bladder cancer [facility])  Date Unknown Abdominal pain  Date Unknown Anxiety  Date Unknown Back pain  Date Unknown Diabetes mellitus  Date Unknown Diverticulosis  Date Unknown Hx of Clostridium difficile infection   Date Unknown Hyperlipidemia  Date Unknown Hypertension  Date Unknown Hypothyroidism  Date Unknown Insomnia  Date Unknown Pneumonia   Date Unknown Rash     surg hx:  Medical History Collapse by Default  Collapse by Default     11/10/2009 COLONIC POLYPS, ADENOMATOUS, HX OF   2010 Bladder cancer [facility])  Date Unknown Abdominal pain  Date Unknown Anxiety  Date Unknown Back pain  Date Unknown Diabetes mellitus  Date Unknown Diverticulosis  Date Unknown Hx of Clostridium difficile infection   Date Unknown Hyperlipidemia  Date Unknown Hypertension  Date Unknown Hypothyroidism  Date Unknown Insomnia  Date Unknown Pneumonia     Date Unknown Rash      Home Medications    Prior to Admission medications   Not on File    Family History No family history on file.  Social History Social History   Tobacco Use   Smoking status: Not on file  Substance Use Topics   Alcohol use: Not on file   Drug use: Not on file     Allergies   Patient has no allergy information on record.   Review of Systems Review of Systems  Unable to perform ROS: Mental status change  All other systems reviewed and are negative.    Physical Exam Updated Vital Signs BP (!) 174/88    Pulse 77    Temp 98.4 F (36.9 C) (Oral)    Resp 11    SpO2 98%   Physical Exam Vitals signs and nursing note reviewed.  Constitutional:      Appearance: Normal appearance.  HENT:     Head: Normocephalic and atraumatic.     Right Ear: External ear normal.     Left Ear: External ear normal.     Nose: Nose normal.     Mouth/Throat:     Mouth: Mucous membranes are moist.     Pharynx: Oropharynx is clear.  Eyes:     Pupils: Pupils are equal, round, and  reactive to light.  Neck:     Musculoskeletal: Normal range of motion and neck supple.  Cardiovascular:     Rate and Rhythm: Normal rate and regular rhythm.     Pulses: Normal pulses.     Heart sounds: Normal heart sounds.  Pulmonary:     Effort: Pulmonary effort is normal.     Breath sounds: Normal breath sounds.  Abdominal:     General: Abdomen is flat.  Musculoskeletal: Normal range of motion.  Skin:    General: Skin is warm.     Capillary Refill: Capillary refill takes less than 2 seconds.  Neurological:     Mental Status: He is alert. He is confused.     Comments: Expressive and receptive aphasia.  Right hemianopsia.  Right sided facial droop.       ED Treatments / Results  Labs (all labs ordered are listed, but only abnormal results are displayed) Labs Reviewed  APTT - Abnormal; Notable for the following components:      Result Value   aPTT 23 (*)    All other  components within normal limits  CBC - Abnormal; Notable for the following components:   Hemoglobin 17.1 (*)    All other components within normal limits  COMPREHENSIVE METABOLIC PANEL - Abnormal; Notable for the following components:   Potassium 3.3 (*)    Glucose, Bld 140 (*)    BUN 6 (*)    All other components within normal limits  I-STAT CHEM 8, ED - Abnormal; Notable for the following components:   Potassium 3.3 (*)    BUN 6 (*)    Glucose, Bld 135 (*)    Calcium, Ion 1.12 (*)    All other components within normal limits  CBG MONITORING, ED - Abnormal; Notable for the following components:   Glucose-Capillary 125 (*)    All other components within normal limits  SARS CORONAVIRUS 2 BY RT PCR (HOSPITAL ORDER, [facility])  ETHANOL  PROTIME-INR  DIFFERENTIAL  RAPID URINE DRUG SCREEN, HOSP PERFORMED  URINALYSIS, ROUTINE W REFLEX MICROSCOPIC    EKG EKG Interpretation  Date/Time:  Wednesday August 03 2019 14:32:59 EST Ventricular Rate:  95 PR Interval:    QRS Duration: 85 QT Interval:  319 QTC Calculation: 401 R Axis:   81 Text Interpretation: Sinus rhythm Borderline right axis deviation Nonspecific repol abnormality, lateral leads No old tracing to compare Confirmed by Isla Pence [phone]) on 08/03/2019 2:45:19 PM   Radiology Ct Angio Head W Or Wo Contrast  Result Date: 08/03/2019 CLINICAL DATA:  Left sided weakness.  Ataxia. EXAM: CT ANGIOGRAPHY HEAD AND NECK CT PERFUSION BRAIN TECHNIQUE: Multidetector CT imaging of the head and neck was performed using the standard protocol during bolus administration of intravenous contrast. Multiplanar CT image reconstructions and MIPs were obtained to evaluate the vascular anatomy. Carotid stenosis measurements (when applicable) are obtained utilizing NASCET criteria, using the distal internal carotid diameter as the denominator. Multiphase CT imaging of the brain was performed following IV bolus  contrast injection. Subsequent parametric perfusion maps were calculated using RAPID software. CONTRAST:  122mL OMNIPAQUE IOHEXOL 350 MG/ML SOLN COMPARISON:  CT head 08/03/2019 FINDINGS: CTA NECK FINDINGS Aortic arch: Standard branching. Imaged portion shows no evidence of aneurysm or dissection. No significant stenosis of the major arch vessel origins. Right carotid system: Atherosclerotic disease at the carotid bifurcation. Less than 25% diameter stenosis right internal carotid artery. Moderate to severe stenosis origin of right external carotid artery. Left carotid  system: Atherosclerotic disease of the carotid bifurcation. 50% diameter stenosis proximal left internal carotid artery. Vertebral arteries: Calcific plaque and moderate stenosis origin of right vertebral artery. Moderate stenosis distal right vertebral artery. Moderate stenosis distal left vertebral artery. Origin of left vertebral artery widely patent. Skeleton: Cervical spine degenerative change. No acute skeletal abnormality. Other neck: Negative for mass or adenopathy in the neck. Upper chest: Lung apices clear bilaterally. Review of the MIP images confirms the above findings CTA HEAD FINDINGS Anterior circulation: Atherosclerotic calcification in the cavernous carotid bilaterally. Mild stenosis supraclinoid internal carotid artery on the left. No significant right carotid stenosis. Anterior and middle cerebral arteries patent bilaterally without stenosis or occlusion. Posterior circulation: Moderate stenosis of the vertebral artery bilaterally at the skull base. PICA patent bilaterally. Basilar widely patent. AICA, superior cerebellar, posterior cerebral arteries patent without significant stenosis bilaterally. Venous sinuses: Limited venous contrast. Anatomic variants: None Review of the MIP images confirms the above findings CT Brain Perfusion Findings: ASPECTS: 10 CBF (<30%) Volume: 24mL Perfusion (Tmax>6.0s) volume: 5mL Mismatch Volume: 74mL  Infarction Location:None IMPRESSION: 1. CT perfusion negative for acute infarct or ischemia 2. Less than 25% diameter stenosis proximal right internal carotid artery. 50% diameter stenosis proximal left internal carotid artery 3. Moderate stenosis at the origin and distal right vertebral artery. Moderate stenosis distal left vertebral artery 4. Negative for intracranial large vessel occlusion. Electronically Signed   By: Franchot Gallo M.D.   On: 08/03/2019 14:46   Ct Angio Neck W Or Wo Contrast  Result Date: 08/03/2019 CLINICAL DATA:  Left sided weakness.  Ataxia. EXAM: CT ANGIOGRAPHY HEAD AND NECK CT PERFUSION BRAIN TECHNIQUE: Multidetector CT imaging of the head and neck was performed using the standard protocol during bolus administration of intravenous contrast. Multiplanar CT image reconstructions and MIPs were obtained to evaluate the vascular anatomy. Carotid stenosis measurements (when applicable) are obtained utilizing NASCET criteria, using the distal internal carotid diameter as the denominator. Multiphase CT imaging of the brain was performed following IV bolus contrast injection. Subsequent parametric perfusion maps were calculated using RAPID software. CONTRAST:  160mL OMNIPAQUE IOHEXOL 350 MG/ML SOLN COMPARISON:  CT head 08/03/2019 FINDINGS: CTA NECK FINDINGS Aortic arch: Standard branching. Imaged portion shows no evidence of aneurysm or dissection. No significant stenosis of the major arch vessel origins. Right carotid system: Atherosclerotic disease at the carotid bifurcation. Less than 25% diameter stenosis right internal carotid artery. Moderate to severe stenosis origin of right external carotid artery. Left carotid system: Atherosclerotic disease of the carotid bifurcation. 50% diameter stenosis proximal left internal carotid artery. Vertebral arteries: Calcific plaque and moderate stenosis origin of right vertebral artery. Moderate stenosis distal right vertebral artery. Moderate  stenosis distal left vertebral artery. Origin of left vertebral artery widely patent. Skeleton: Cervical spine degenerative change. No acute skeletal abnormality. Other neck: Negative for mass or adenopathy in the neck. Upper chest: Lung apices clear bilaterally. Review of the MIP images confirms the above findings CTA HEAD FINDINGS Anterior circulation: Atherosclerotic calcification in the cavernous carotid bilaterally. Mild stenosis supraclinoid internal carotid artery on the left. No significant right carotid stenosis. Anterior and middle cerebral arteries patent bilaterally without stenosis or occlusion. Posterior circulation: Moderate stenosis of the vertebral artery bilaterally at the skull base. PICA patent bilaterally. Basilar widely patent. AICA, superior cerebellar, posterior cerebral arteries patent without significant stenosis bilaterally. Venous sinuses: Limited venous contrast. Anatomic variants: None Review of the MIP images confirms the above findings CT Brain Perfusion Findings: ASPECTS: 10 CBF (<30%) Volume: 34mL  Perfusion (Tmax>6.0s) volume: 62mL Mismatch Volume: 60mL Infarction Location:None IMPRESSION: 1. CT perfusion negative for acute infarct or ischemia 2. Less than 25% diameter stenosis proximal right internal carotid artery. 50% diameter stenosis proximal left internal carotid artery 3. Moderate stenosis at the origin and distal right vertebral artery. Moderate stenosis distal left vertebral artery 4. Negative for intracranial large vessel occlusion. Electronically Signed   By: Franchot Gallo M.D.   On: 08/03/2019 14:46   Ct Cerebral Perfusion W Contrast  Result Date: 08/03/2019 CLINICAL DATA:  Left sided weakness.  Ataxia. EXAM: CT ANGIOGRAPHY HEAD AND NECK CT PERFUSION BRAIN TECHNIQUE: Multidetector CT imaging of the head and neck was performed using the standard protocol during bolus administration of intravenous contrast. Multiplanar CT image reconstructions and MIPs were obtained to  evaluate the vascular anatomy. Carotid stenosis measurements (when applicable) are obtained utilizing NASCET criteria, using the distal internal carotid diameter as the denominator. Multiphase CT imaging of the brain was performed following IV bolus contrast injection. Subsequent parametric perfusion maps were calculated using RAPID software. CONTRAST:  134mL OMNIPAQUE IOHEXOL 350 MG/ML SOLN COMPARISON:  CT head 08/03/2019 FINDINGS: CTA NECK FINDINGS Aortic arch: Standard branching. Imaged portion shows no evidence of aneurysm or dissection. No significant stenosis of the major arch vessel origins. Right carotid system: Atherosclerotic disease at the carotid bifurcation. Less than 25% diameter stenosis right internal carotid artery. Moderate to severe stenosis origin of right external carotid artery. Left carotid system: Atherosclerotic disease of the carotid bifurcation. 50% diameter stenosis proximal left internal carotid artery. Vertebral arteries: Calcific plaque and moderate stenosis origin of right vertebral artery. Moderate stenosis distal right vertebral artery. Moderate stenosis distal left vertebral artery. Origin of left vertebral artery widely patent. Skeleton: Cervical spine degenerative change. No acute skeletal abnormality. Other neck: Negative for mass or adenopathy in the neck. Upper chest: Lung apices clear bilaterally. Review of the MIP images confirms the above findings CTA HEAD FINDINGS Anterior circulation: Atherosclerotic calcification in the cavernous carotid bilaterally. Mild stenosis supraclinoid internal carotid artery on the left. No significant right carotid stenosis. Anterior and middle cerebral arteries patent bilaterally without stenosis or occlusion. Posterior circulation: Moderate stenosis of the vertebral artery bilaterally at the skull base. PICA patent bilaterally. Basilar widely patent. AICA, superior cerebellar, posterior cerebral arteries patent without significant stenosis  bilaterally. Venous sinuses: Limited venous contrast. Anatomic variants: None Review of the MIP images confirms the above findings CT Brain Perfusion Findings: ASPECTS: 10 CBF (<30%) Volume: 67mL Perfusion (Tmax>6.0s) volume: 58mL Mismatch Volume: 59mL Infarction Location:None IMPRESSION: 1. CT perfusion negative for acute infarct or ischemia 2. Less than 25% diameter stenosis proximal right internal carotid artery. 50% diameter stenosis proximal left internal carotid artery 3. Moderate stenosis at the origin and distal right vertebral artery. Moderate stenosis distal left vertebral artery 4. Negative for intracranial large vessel occlusion. Electronically Signed   By: Franchot Gallo M.D.   On: 08/03/2019 14:46   Ct Head Code Stroke Wo Contrast  Result Date: 08/03/2019 CLINICAL DATA:  Code stroke.  Ataxia.  Rule out stroke. EXAM: CT HEAD WITHOUT CONTRAST TECHNIQUE: Contiguous axial images were obtained from the base of the skull through the vertex without intravenous contrast. COMPARISON:  CT head 11/10/2005 FINDINGS: Brain: No evidence of acute infarction, hemorrhage, hydrocephalus, extra-axial collection or mass lesion/mass effect. Vascular: Negative for hyperdense vessel Skull: Negative Sinuses/Orbits: Negative Other: None ASPECTS ([place] Stroke Program Early CT Score) - Ganglionic level infarction (caudate, lentiform nuclei, internal capsule, insula, M1-M3 cortex): 7 - Supraganglionic infarction (  M4-M6 cortex): 3 Total score (0-10 with 10 being normal): 10 IMPRESSION: 1. No acute abnormality 2. ASPECTS is 10 3. These results were called by telephone at the time of interpretation on 08/03/2019 at 2:30 pm to provider Leonel Ramsay, who verbally acknowledged these results. Electronically Signed   By: Franchot Gallo M.D.   On: 08/03/2019 14:30    Procedures Procedures (including critical care time)  Medications Ordered in ED Medications  iohexol (OMNIPAQUE) 350 MG/ML injection 100 mL (100 mLs Intravenous  Contrast Given 08/03/19 1433)     Initial Impression / Assessment and Plan / ED Course  I have reviewed the triage vital signs and the nursing notes.  Pertinent labs & imaging results that were available during my care of the patient were reviewed by me and considered in my medical decision making (see chart for details).     The pt does not have evidence of a stroke.  Neurology ordered an EEG in case of seizures.  Cause of MS still unclear.  Urine and CXR pending at shift change.  Pt signed out to Dr. Laverta Baltimore.  Final Clinical Impressions(s) / ED Diagnoses   Final diagnoses:  Acute metabolic encephalopathy    ED Discharge Orders    None       Isla Pence, MD 08/03/19 [phone]

## 2019-08-03 NOTE — Consult Note (Addendum)
Neurology Consultation  Reason for Consult: Code stroke Referring Physician: Gilford Raid  History is obtained from: Wife and EMS  HPI: Dan [name] is a 73 y.o. male with pertinent history of hypertension, hyperlipidemia, diabetes.  Per wife he was last seen normal at 2000 hrs. on 08/02/2019.  She states that she woke up at approximately 2 AM hearing loud banging.  She went out and found him in the living room beating on the wall, telling her he was trying to open up the door which was not there.  She eventually got him back to bed.  As they sleep in different rooms, she would notice he was getting up and down all night long.  Each time she found him up he happened to be urinating and that room, or on the couch that he loves, or in the corner of the kitchen.  She then states that she had finally gone to the commode and he did not wipe himself which is very abnormal for him.  Due to that she finally called EMS.  EMS brought patient in as a code stroke.   ED course CT/CTA of head and neck/CT perfusion 's  LKW: 08/03/2019 at 2000 hrs. tpa given?: no, out of window Premorbid modified Rankin scale (mRS): 0 NIH stroke scale of 9   PMHx: Unable to obtain due to altered mental status.   Family history: Unable to obtain due to altered mental status.   Social History:   has no history on file for tobacco, alcohol, and drug.  Medications No current facility-administered medications for this encounter.  No current outpatient medications on file.   Exam: Current vital signs: BP (!) 193/95   Pulse 97   Temp 98.4 F (36.9 C) (Oral)   Resp 12   SpO2 98%  Vital signs in last 24 hours: Temp:  [98.4 F (36.9 C)] 98.4 F (36.9 C) (11/25 1435) Pulse Rate:  [97] 97 (11/25 1435) Resp:  [12] 12 (11/25 1435) BP: (193)/(95) 193/95 (11/25 1435) SpO2:  [98 %] 98 % (11/25 1435)  ROS:  Unable to obtain due to altered mental status.     Physical Exam   Constitutional: Appears well-developed and  well-nourished.  Psych: Aphasic Eyes: No scleral injection HENT: No OP obstrucion Head: Normocephalic.  Cardiovascular: Normal rate and regular rhythm.  Respiratory: Effort normal, non-labored breathing GI: Soft.  No distension. There is no tenderness.  Skin: WDI  Neuro: Mental Status: Patient is awake, answers all questions with yes or no answers Cannot give history Shows both expressive and receptive aphasia Cranial Nerves: II: Right hemianopsia III,IV, VI: EOMI . Pupils equal, round and reactive to light V: Winces to pain bilaterally VII: Right facial droop at rest.  VIII: hearing is intact to voice Motor: Moving bilateral upper extremities 5/5 strength.  Allows bilateral legs to drift to the bed.  Does withdraw bilateral legs to noxious stimuli antigravity. Sensory: Sponsor noxious stimuli in all extremities Deep Tendon Reflexes: Difficult to obtain as patient is not resting still.  However he does have 2+ in the upper extremities 1+ bilateral ankle jerks Plantars: Downgoing bilaterally  Labs I have reviewed labs in epic and the results pertinent to this consultation are:   CBC    Component Value Date/Time   WBC 9.0 08/03/2019 1411   RBC 5.51 08/03/2019 1411   HGB 16.7 08/03/2019 1417   HCT 49.0 08/03/2019 1417   PLT 214 08/03/2019 1411   MCV 88.0 08/03/2019 1411   MCH 31.0 08/03/2019 1411  MCHC 35.3 08/03/2019 1411   RDW 12.0 08/03/2019 1411   LYMPHSABS 1.5 08/03/2019 1411   MONOABS 0.5 08/03/2019 1411   EOSABS 0.1 08/03/2019 1411   BASOSABS 0.0 08/03/2019 1411    CMP     Component Value Date/Time   NA 145 08/03/2019 1417   K 3.3 (L) 08/03/2019 1417   CL 102 08/03/2019 1417   GLUCOSE 135 (H) 08/03/2019 1417   BUN 6 (L) 08/03/2019 1417   CREATININE 0.70 08/03/2019 1417    Lipid Panel  No results found for: CHOL, TRIG, HDL, CHOLHDL, VLDL, LDLCALC, LDLDIRECT   Imaging I have reviewed the images obtained:  CT-scan of the brain-no acute  abnormality, aspect score of 10  CT angio of head-negative for intracranial large vessel occlusion.  Moderate stenosis at the origin of the distal right vertebral artery.  Moderate stenosis at the distal left vertebral artery.  CT angio of neck-less than 25% stenosis proximal right internal carotid artery.  50% diameter stenosis of the left internal carotid artery.  CT perfusion-CT perfusion negative for acute infarct or ischemia  MRI examination of the brain-pending  Etta Quill PA-C [facility] [phone]  M-F  (9:00 am- 5:00 PM)  08/03/2019, 2:41 PM   I have seen the patient reviewed the above note.  He has a right facial droop, right hemianopia, and fairly dense aphasia.  I actually wonder about some mild changes in the left parieto-occipital region, but an MRI will give a better idea.  Assessment:  This is a 73 year old male presenting to the ED secondary to confusion and aphasia.  CT perfusion showed no infarct and CTA did not show any large vessel occlusion.  The exam is characteristic of left M2 occlusion I suspect that he had occlusion with recanalization and hence the normal CT perfusion.  I would, however, without any other explanation recommend a stat EEG to rule out seizure as a source of a negative deficit, but strongly suspect stroke as etiology.  Recommend #Transthoracic Echo,  # Start patient on ASA 325mg  daily,   #Start or continue Atorvastatin 80 mg/other high intensity statin # BP goal: permissive HTN upto 220/120 mmHg # HBAIC and Lipid profile # Telemetry monitoring # Frequent neuro checks # NPO until passes stroke swallow screen #EEG # please page stroke NP  Or  PA  Or MD from 8am -4 pm  as this patient from this time will be  followed by the stroke.   You can look them up on [email or web address]  Password [email or web address]   Roland Rack, MD [facility] [phone]  If 7pm- 7am, please page neurology on call as listed in [place].

## 2019-08-04 ENCOUNTER — Inpatient Hospital Stay (HOSPITAL_COMMUNITY): Payer: Medicare Other

## 2019-08-04 DIAGNOSIS — E119 Type 2 diabetes mellitus without complications: Secondary | ICD-10-CM

## 2019-08-04 DIAGNOSIS — E039 Hypothyroidism, unspecified: Secondary | ICD-10-CM

## 2019-08-04 DIAGNOSIS — Z79890 Hormone replacement therapy (postmenopausal): Secondary | ICD-10-CM

## 2019-08-04 DIAGNOSIS — I10 Essential (primary) hypertension: Secondary | ICD-10-CM

## 2019-08-04 DIAGNOSIS — I6359 Cerebral infarction due to unspecified occlusion or stenosis of other cerebral artery: Secondary | ICD-10-CM

## 2019-08-04 DIAGNOSIS — G4700 Insomnia, unspecified: Secondary | ICD-10-CM

## 2019-08-04 DIAGNOSIS — E785 Hyperlipidemia, unspecified: Secondary | ICD-10-CM

## 2019-08-04 DIAGNOSIS — Z79899 Other long term (current) drug therapy: Secondary | ICD-10-CM

## 2019-08-04 DIAGNOSIS — R471 Dysarthria and anarthria: Secondary | ICD-10-CM

## 2019-08-04 DIAGNOSIS — G9340 Encephalopathy, unspecified: Secondary | ICD-10-CM

## 2019-08-04 DIAGNOSIS — I6389 Other cerebral infarction: Secondary | ICD-10-CM

## 2019-08-04 DIAGNOSIS — Z794 Long term (current) use of insulin: Secondary | ICD-10-CM

## 2019-08-04 DIAGNOSIS — F419 Anxiety disorder, unspecified: Secondary | ICD-10-CM

## 2019-08-04 LAB — BASIC METABOLIC PANEL
Anion gap: 12 (ref 5–15)
BUN: 7 mg/dL — ABNORMAL LOW (ref 8–23)
CO2: 24 mmol/L (ref 22–32)
Calcium: 9.3 mg/dL (ref 8.9–10.3)
Chloride: 106 mmol/L (ref 98–111)
Creatinine, Ser: 0.73 mg/dL (ref 0.61–1.24)
GFR calc Af Amer: >60 mL/min (ref >60)
GFR calc non Af Amer: >60 mL/min (ref >60)
Glucose, Bld: 142 mg/dL — ABNORMAL HIGH (ref 70–99)
Potassium: 2.9 mmol/L — ABNORMAL LOW (ref 3.5–5.1)
Sodium: 142 mmol/L (ref 135–145)

## 2019-08-04 LAB — HEMOGLOBIN A1C
Hgb A1c MFr Bld: 6.1 % — ABNORMAL HIGH (ref 4.8–5.6)
Mean Plasma Glucose: 128.37 mg/dL

## 2019-08-04 LAB — LIPID PANEL
Cholesterol: 126 mg/dL (ref 0–200)
HDL: 31 mg/dL — ABNORMAL LOW (ref >40)
LDL Cholesterol: 47 mg/dL (ref 0–99)
Total CHOL/HDL Ratio: 4.1 RATIO
Triglycerides: 242 mg/dL — ABNORMAL HIGH (ref <150)
VLDL: 48 mg/dL — ABNORMAL HIGH (ref 0–40)

## 2019-08-04 LAB — CBC
HCT: 47.4 % (ref 39.0–52.0)
Hemoglobin: 16.7 g/dL (ref 13.0–17.0)
MCH: 30.9 pg (ref 26.0–34.0)
MCHC: 35.2 g/dL (ref 30.0–36.0)
MCV: 87.8 fL (ref 80.0–100.0)
Platelets: 221 10*3/uL (ref 150–400)
RBC: 5.4 MIL/uL (ref 4.22–5.81)
RDW: 12.1 % (ref 11.5–15.5)
WBC: 9 10*3/uL (ref 4.0–10.5)
nRBC: 0 % (ref 0.0–0.2)

## 2019-08-04 LAB — GLUCOSE, CAPILLARY
Glucose-Capillary: 142 mg/dL — ABNORMAL HIGH (ref 70–99)
Glucose-Capillary: 160 mg/dL — ABNORMAL HIGH (ref 70–99)
Glucose-Capillary: 110 mg/dL — ABNORMAL HIGH (ref 70–99)
Glucose-Capillary: 116 mg/dL — ABNORMAL HIGH (ref 70–99)
Glucose-Capillary: 127 mg/dL — ABNORMAL HIGH (ref 70–99)

## 2019-08-04 LAB — ECHOCARDIOGRAM COMPLETE
Height: 72 in
Weight: 3376 oz

## 2019-08-04 MED ORDER — PERFLUTREN LIPID MICROSPHERE
1.0000 mL | INTRAVENOUS | Status: AC | PRN
  Administered 2019-08-04: 2 mL via INTRAVENOUS
  Filled 2019-08-04: qty 10

## 2019-08-04 MED ORDER — CLOPIDOGREL BISULFATE 75 MG PO TABS
75.0000 mg | ORAL_TABLET | Freq: Every day | ORAL | Status: DC
  Administered 2019-08-04 – 2019-08-05 (×2): 75 mg via ORAL
  Filled 2019-08-04 (×2): qty 1

## 2019-08-04 MED ORDER — ZOLPIDEM TARTRATE 5 MG PO TABS
5.0000 mg | ORAL_TABLET | Freq: Every evening | ORAL | Status: DC | PRN
  Administered 2019-08-04 (×2): 5 mg via ORAL
  Filled 2019-08-04 (×2): qty 1

## 2019-08-04 MED ORDER — LORAZEPAM 2 MG/ML IJ SOLN: 2.0000 mg | Freq: Once | INTRAMUSCULAR | Status: DC

## 2019-08-04 MED ORDER — RAMELTEON 8 MG PO TABS
8.0000 mg | ORAL_TABLET | Freq: Every day | ORAL | Status: DC
  Filled 2019-08-04: qty 1

## 2019-08-04 MED ORDER — LORAZEPAM 2 MG/ML IJ SOLN
4.0000 mg | Freq: Once | INTRAMUSCULAR | Status: DC
  Filled 2019-08-04: qty 2

## 2019-08-04 MED ORDER — ASPIRIN EC 81 MG PO TBEC
81.0000 mg | DELAYED_RELEASE_TABLET | Freq: Every day | ORAL | Status: DC
  Administered 2019-08-04 – 2019-08-05 (×2): 81 mg via ORAL
  Filled 2019-08-04 (×2): qty 1

## 2019-08-04 NOTE — Plan of Care (Signed)
Patient stable, discussed POC with patient daughter, agreeable with plan, denies question/concerns at this time.   Problem: Education: Goal: Knowledge of disease or condition will improve Outcome: Progressing Goal: Knowledge of secondary prevention will improve Outcome: Progressing Goal: Knowledge of patient specific risk factors addressed and post discharge goals established will improve Outcome: Progressing Goal: Individualized Educational Video(s) Outcome: Progressing   Problem: Coping: Goal: Will verbalize positive feelings about self Outcome: Progressing   Problem: Health Behavior/Discharge Planning: Goal: Ability to manage health-related needs will improve Outcome: Progressing   Problem: Self-Care: Goal: Ability to participate in self-care as condition permits will improve Outcome: Progressing   Problem: Nutrition: Goal: Risk of aspiration will decrease Outcome: Progressing   Problem: Ischemic Stroke/TIA Tissue Perfusion: Goal: Complications of ischemic stroke/TIA will be minimized Outcome: Progressing

## 2019-08-04 NOTE — Progress Notes (Signed)
   Subjective: Mr. Moquete was more oriented today. Able to tell us what brought him in. He has good understanding, but still endorsing some word finding difficulties. He otherwise is without any complaints. Still having anxiety about MRI which we explained is important to get as part of his work-up and will give him some medication to help with the anxiety.   Objective:  Vital signs in last 24 hours: Vitals:   08/03/19 2242 08/04/19 0043 08/04/19 0238 08/04/19 0430  BP: (!) 175/91 (!) 187/92 (!) 170/83 (!) 175/88  Pulse: 80 81 82 82  Resp: 18 20 18 18   Temp: 98.5 F (36.9 C) 98.5 F (36.9 C) 98.3 F (36.8 C) 98.2 F (36.8 C)  TempSrc: Oral Oral  Oral  SpO2: 99% 97% 96% 95%  Weight:      Height:       General: Working with PT on approach , ambulating with assistance.  CV: RRR, no murmurs rubs or gallops Neuro: Mental Status: Patient is awake, alert, oriented to self, place, situation , but not to year Paraphasia when asked to repeat sentences.  Cranial Nerves: II: Pupils equal, round, and reactive to light.   III,IV, VI: EOMI without ptosis or diploplia.  V: Facial sensation is symmetric to light touch  VII: Facial movement , mild weakness noted on right side VIII: hearing is intact to voice X: Uvula elevates symmetrically XI: Shoulder shrug is symmetric. XII: tongue is midline without atrophy or fasciculations.  Motor: Normal effort thorughout, at Least 5/5 bilateral UE, 5/5 bilateral lower extremitiy  Sensory: Sensation is grossly intact to light touch bilateral UEs & LEs Cerebellar: Heel-Shin intact bilaterall   Assessment/Plan:  Active Problems:   Encephalopathy acute  Jonathan Mcquarrie is a 73 year old male with HTN, HLD, T2DM, and insomnia who presented to ED as a code stroke.   #CVA - CT and CTA were negative, but physical exam consistent with M2 stroke  - exam improved from yesterday but still with dysarthria and right homonymous hemianopsia  - will attempt to obtain  MRI again with increased dose of Ativan  - appreciate neurology following; plan for DAPT x3 weeks followed by aspirin alone  - continue Crestor 40 mg; lipid panel shows LDL at goal - A1C of 6.1  - follow-up TTE - doing well with PT/OT - given the concern for embolic etiology, will need further work-up outpatient with TEE and possible long-term cardiac monitoring   #Hypothyroidism - TSH normal -Continue levothyroxine  #Type II DM  - well controlled based on A1C of 6.1 -Lantus 20 units --SSI  Dispo: Anticipated discharge in approximately 1-2 day(s).   [name], MD PGY1  [phone]

## 2019-08-04 NOTE — Evaluation (Signed)
Physical Therapy Evaluation Patient Details Name: Dan [name] MRN: [id number] DOB: 1945-09-26 Today's Date: 08/04/2019   History of Present Illness  73 y.o. male admitted on 08/03/19 for confusion, aphasia.  Pt with suspected stroke.  No tPA given.  CT was negative, MRI is pending (needs sedative to tolerate).  EEG negative for seizure.  Pt with significant PMH of HTN, DM, bladder CA, eye surgery.    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  Pt with high guard gait pattern, slow to move, slow to process, and slow to answer at times.  He also seems to have a right visual field deficit as he kept missing the railing on the R side on the stairs (unknowingly).  Pt would benefit from OP PT for high level balance, gait and cognitive training to help him return to his very physical baseline.   PT to follow acutely for deficits listed below.      Follow Up Recommendations Outpatient PT    Equipment Recommendations  None recommended by PT    Recommendations for Other Services   NA    Precautions / Restrictions Precautions Precautions: Fall Precaution Comments: unsteady, R visual field deficit      Mobility  Bed Mobility Overal bed mobility: Modified Independent             General bed mobility comments: extra time, but no external assist  Transfers Overall transfer level: Needs assistance   Transfers: Sit to/from Stand Sit to Stand: Supervision         General transfer comment: close supervision due to pt leaning bil lower legs against the bed for stability initially.   Ambulation/Gait Ambulation/Gait assistance: Min guard Gait Distance (Feet): 200 Feet Assistive device: 1 person hand held assist;None Gait Pattern/deviations: Step-through pattern;Wide base of support(high guard posture with wide base and arms out) Gait  velocity: decreased Gait velocity interpretation: 1.31 - 2.62 ft/sec, indicative of limited community ambulator General Gait Details: Pt with slow gait speed, high guard pattern with wide base, arms out, mildly staggering pattern.   Stairs Stairs: Yes Stairs assistance: Supervision Stair Management: Two rails;Alternating pattern;Forwards Number of Stairs: 5(x2) General stair comments: supervision for safety, at times missing handrail on R side      Modified Rankin (Stroke Patients Only) Modified Rankin (Stroke Patients Only) Pre-Morbid Rankin Score: No symptoms Modified Rankin: Moderately severe disability     Balance Overall balance assessment: Needs assistance Sitting-balance support: No upper extremity supported;Feet supported Sitting balance-Leahy Scale: Good     Standing balance support: No upper extremity supported Standing balance-Leahy Scale: Good                               Pertinent Vitals/Pain Pain Assessment: No/denies pain    Home Living Family/patient expects to be discharged to:: Private residence Living Arrangements: Spouse/significant other Available Help at Discharge: Family;Available PRN/intermittently(wife works) Type of Home: House       Home Layout: One level Home Equipment: None Additional Comments: Pt with difficulty accurately relaying information    Prior Function Level of Independence: Independent         Comments: manages property at baseline     Hand Dominance   Dominant Hand: Right    Extremity/Trunk Assessment   Upper Extremity Assessment Upper Extremity Assessment: Defer to OT evaluation  Lower Extremity Assessment Lower Extremity Assessment: Overall WFL for tasks assessed    Cervical / Trunk Assessment Cervical / Trunk Assessment: Normal  Communication   Communication: Expressive difficulties  Cognition Arousal/Alertness: Awake/alert Behavior During Therapy: WFL for tasks  assessed/performed Overall Cognitive Status: Impaired/Different from baseline Area of Impairment: Safety/judgement;Awareness;Problem solving                         Safety/Judgement: Decreased awareness of safety;Decreased awareness of deficits Awareness: Intellectual Problem Solving: Slow processing General Comments: Pt slow to process, could not tell me his birthday (could not get it out vs could not remember?)             Assessment/Plan    PT Assessment Patient needs continued PT services  PT Problem List Decreased balance;Decreased mobility;Decreased cognition;Decreased knowledge of use of DME;Decreased safety awareness;Decreased knowledge of precautions       PT Treatment Interventions DME instruction;Gait training;Stair training;Functional mobility training;Therapeutic activities;Therapeutic exercise;Balance training;Cognitive remediation;Neuromuscular re-education;Patient/family education    PT Goals (Current goals can be found in the Care Plan section)  Acute Rehab PT Goals Patient Stated Goal: to go home soon PT Goal Formulation: With patient Time For Goal Achievement: 08/18/19 Potential to Achieve Goals: Good    Frequency Min 4X/week           AM-PAC PT "6 Clicks" Mobility  Outcome Measure Help needed turning from your back to your side while in a flat bed without using bedrails?: None Help needed moving from lying on your back to sitting on the side of a flat bed without using bedrails?: None Help needed moving to and from a bed to a chair (including a wheelchair)?: None Help needed standing up from a chair using your arms (e.g., wheelchair or bedside chair)?: None Help needed to walk in hospital room?: A Little Help needed climbing 3-5 steps with a railing? : None 6 Click Score: 23    End of Session   Activity Tolerance: Patient tolerated treatment well Patient left: in chair;with call bell/phone within reach;with chair alarm set   PT Visit  Diagnosis: Difficulty in walking, not elsewhere classified (R26.2);Unsteadiness on feet (R26.81);Other symptoms and signs involving the nervous system [id number])    Time: [id number] PT Time Calculation (min) (ACUTE ONLY): 44 min   Charges:          Verdene Lennert, PT, DPT  Acute Rehabilitation [phone] pager #[phone] office  @ [facility]: [phone]   PT Evaluation $PT Eval Moderate Complexity: 1 Mod PT Treatments $Gait Training: 8-22 mins      08/04/2019, 11:34 AM

## 2019-08-04 NOTE — Progress Notes (Signed)
STROKE TEAM PROGRESS NOTE   INTERVAL HISTORY Patient presented with sudden onset of confusion and was found to have aphasia and right-sided peripheral vision loss.  I personally reviewed history of presenting illness with the patient, electronic medical records and imaging films in PACS.  CT angiogram does not show any large vessel stenosis or occlusion and CT perfusion is negative for stroke.  MRI scan of the brain is pending.  Vitals:   08/04/19 0430 08/04/19 0630 08/04/19 0900 08/04/19 0902  BP: (!) 175/88 (!) 176/82 (!) 192/92 (!) 186/95  Pulse: 82 79 86 88  Resp: 18 18 18    Temp: 98.2 F (36.8 C) 98.3 F (36.8 C) 98.5 F (36.9 C)   TempSrc: Oral Oral Oral   SpO2: 95% 98% 98% 97%  Weight:      Height:        CBC:  Recent Labs  Lab 08/03/19 1411 08/03/19 1417 08/04/19 0711  WBC 9.0  --  9.0  NEUTROABS 6.8  --   --   HGB 17.1* 16.7 16.7  HCT 48.5 49.0 47.4  MCV 88.0  --  87.8  PLT 214  --  [id number]    Basic Metabolic Panel:  Recent Labs  Lab 08/03/19 1411 08/03/19 1417 08/04/19 0711  NA 141 145 142  K 3.3* 3.3* 2.9*  CL 105 102 106  CO2 23  --  24  GLUCOSE 140* 135* 142*  BUN 6* 6* 7*  CREATININE 0.93 0.70 0.73  CALCIUM 9.4  --  9.3   Lipid Panel:     Component Value Date/Time   CHOL 126 08/04/2019 0711   TRIG 242 (H) 08/04/2019 0711   HDL 31 (L) 08/04/2019 0711   CHOLHDL 4.1 08/04/2019 0711   VLDL 48 (H) 08/04/2019 0711   LDLCALC 47 08/04/2019 0711   HgbA1c:  Lab Results  Component Value Date   HGBA1C 6.1 (H) 08/04/2019   Urine Drug Screen:     Component Value Date/Time   LABOPIA POSITIVE (A) 08/03/2019 1900   COCAINSCRNUR NONE DETECTED 08/03/2019 1900   LABBENZ POSITIVE (A) 08/03/2019 1900   AMPHETMU NONE DETECTED 08/03/2019 1900   THCU NONE DETECTED 08/03/2019 1900   LABBARB NONE DETECTED 08/03/2019 1900    Alcohol Level     Component Value Date/Time   ETH <10 08/03/2019 1411    IMAGING Ct Angio Head W Or Wo Contrast  Result Date:  08/03/2019 CLINICAL DATA:  Left sided weakness.  Ataxia. EXAM: CT ANGIOGRAPHY HEAD AND NECK CT PERFUSION BRAIN TECHNIQUE: Multidetector CT imaging of the head and neck was performed using the standard protocol during bolus administration of intravenous contrast. Multiplanar CT image reconstructions and MIPs were obtained to evaluate the vascular anatomy. Carotid stenosis measurements (when applicable) are obtained utilizing NASCET criteria, using the distal internal carotid diameter as the denominator. Multiphase CT imaging of the brain was performed following IV bolus contrast injection. Subsequent parametric perfusion maps were calculated using RAPID software. CONTRAST:  146mL OMNIPAQUE IOHEXOL 350 MG/ML SOLN COMPARISON:  CT head 08/03/2019 FINDINGS: CTA NECK FINDINGS Aortic arch: Standard branching. Imaged portion shows no evidence of aneurysm or dissection. No significant stenosis of the major arch vessel origins. Right carotid system: Atherosclerotic disease at the carotid bifurcation. Less than 25% diameter stenosis right internal carotid artery. Moderate to severe stenosis origin of right external carotid artery. Left carotid system: Atherosclerotic disease of the carotid bifurcation. 50% diameter stenosis proximal left internal carotid artery. Vertebral arteries: Calcific plaque and moderate stenosis origin of  right vertebral artery. Moderate stenosis distal right vertebral artery. Moderate stenosis distal left vertebral artery. Origin of left vertebral artery widely patent. Skeleton: Cervical spine degenerative change. No acute skeletal abnormality. Other neck: Negative for mass or adenopathy in the neck. Upper chest: Lung apices clear bilaterally. Review of the MIP images confirms the above findings CTA HEAD FINDINGS Anterior circulation: Atherosclerotic calcification in the cavernous carotid bilaterally. Mild stenosis supraclinoid internal carotid artery on the left. No significant right carotid  stenosis. Anterior and middle cerebral arteries patent bilaterally without stenosis or occlusion. Posterior circulation: Moderate stenosis of the vertebral artery bilaterally at the skull base. PICA patent bilaterally. Basilar widely patent. AICA, superior cerebellar, posterior cerebral arteries patent without significant stenosis bilaterally. Venous sinuses: Limited venous contrast. Anatomic variants: None Review of the MIP images confirms the above findings CT Brain Perfusion Findings: ASPECTS: 10 CBF (<30%) Volume: 58mL Perfusion (Tmax>6.0s) volume: 75mL Mismatch Volume: 25mL Infarction Location:None IMPRESSION: 1. CT perfusion negative for acute infarct or ischemia 2. Less than 25% diameter stenosis proximal right internal carotid artery. 50% diameter stenosis proximal left internal carotid artery 3. Moderate stenosis at the origin and distal right vertebral artery. Moderate stenosis distal left vertebral artery 4. Negative for intracranial large vessel occlusion. Electronically Signed   By: Franchot Gallo M.D.   On: 08/03/2019 14:46   Ct Angio Neck W Or Wo Contrast  Result Date: 08/03/2019 CLINICAL DATA:  Left sided weakness.  Ataxia. EXAM: CT ANGIOGRAPHY HEAD AND NECK CT PERFUSION BRAIN TECHNIQUE: Multidetector CT imaging of the head and neck was performed using the standard protocol during bolus administration of intravenous contrast. Multiplanar CT image reconstructions and MIPs were obtained to evaluate the vascular anatomy. Carotid stenosis measurements (when applicable) are obtained utilizing NASCET criteria, using the distal internal carotid diameter as the denominator. Multiphase CT imaging of the brain was performed following IV bolus contrast injection. Subsequent parametric perfusion maps were calculated using RAPID software. CONTRAST:  165mL OMNIPAQUE IOHEXOL 350 MG/ML SOLN COMPARISON:  CT head 08/03/2019 FINDINGS: CTA NECK FINDINGS Aortic arch: Standard branching. Imaged portion shows no evidence  of aneurysm or dissection. No significant stenosis of the major arch vessel origins. Right carotid system: Atherosclerotic disease at the carotid bifurcation. Less than 25% diameter stenosis right internal carotid artery. Moderate to severe stenosis origin of right external carotid artery. Left carotid system: Atherosclerotic disease of the carotid bifurcation. 50% diameter stenosis proximal left internal carotid artery. Vertebral arteries: Calcific plaque and moderate stenosis origin of right vertebral artery. Moderate stenosis distal right vertebral artery. Moderate stenosis distal left vertebral artery. Origin of left vertebral artery widely patent. Skeleton: Cervical spine degenerative change. No acute skeletal abnormality. Other neck: Negative for mass or adenopathy in the neck. Upper chest: Lung apices clear bilaterally. Review of the MIP images confirms the above findings CTA HEAD FINDINGS Anterior circulation: Atherosclerotic calcification in the cavernous carotid bilaterally. Mild stenosis supraclinoid internal carotid artery on the left. No significant right carotid stenosis. Anterior and middle cerebral arteries patent bilaterally without stenosis or occlusion. Posterior circulation: Moderate stenosis of the vertebral artery bilaterally at the skull base. PICA patent bilaterally. Basilar widely patent. AICA, superior cerebellar, posterior cerebral arteries patent without significant stenosis bilaterally. Venous sinuses: Limited venous contrast. Anatomic variants: None Review of the MIP images confirms the above findings CT Brain Perfusion Findings: ASPECTS: 10 CBF (<30%) Volume: 37mL Perfusion (Tmax>6.0s) volume: 81mL Mismatch Volume: 69mL Infarction Location:None IMPRESSION: 1. CT perfusion negative for acute infarct or ischemia 2. Less than 25% diameter  stenosis proximal right internal carotid artery. 50% diameter stenosis proximal left internal carotid artery 3. Moderate stenosis at the origin and distal  right vertebral artery. Moderate stenosis distal left vertebral artery 4. Negative for intracranial large vessel occlusion. Electronically Signed   By: Franchot Gallo M.D.   On: 08/03/2019 14:46   Ct Cerebral Perfusion W Contrast  Result Date: 08/03/2019 CLINICAL DATA:  Left sided weakness.  Ataxia. EXAM: CT ANGIOGRAPHY HEAD AND NECK CT PERFUSION BRAIN TECHNIQUE: Multidetector CT imaging of the head and neck was performed using the standard protocol during bolus administration of intravenous contrast. Multiplanar CT image reconstructions and MIPs were obtained to evaluate the vascular anatomy. Carotid stenosis measurements (when applicable) are obtained utilizing NASCET criteria, using the distal internal carotid diameter as the denominator. Multiphase CT imaging of the brain was performed following IV bolus contrast injection. Subsequent parametric perfusion maps were calculated using RAPID software. CONTRAST:  171mL OMNIPAQUE IOHEXOL 350 MG/ML SOLN COMPARISON:  CT head 08/03/2019 FINDINGS: CTA NECK FINDINGS Aortic arch: Standard branching. Imaged portion shows no evidence of aneurysm or dissection. No significant stenosis of the major arch vessel origins. Right carotid system: Atherosclerotic disease at the carotid bifurcation. Less than 25% diameter stenosis right internal carotid artery. Moderate to severe stenosis origin of right external carotid artery. Left carotid system: Atherosclerotic disease of the carotid bifurcation. 50% diameter stenosis proximal left internal carotid artery. Vertebral arteries: Calcific plaque and moderate stenosis origin of right vertebral artery. Moderate stenosis distal right vertebral artery. Moderate stenosis distal left vertebral artery. Origin of left vertebral artery widely patent. Skeleton: Cervical spine degenerative change. No acute skeletal abnormality. Other neck: Negative for mass or adenopathy in the neck. Upper chest: Lung apices clear bilaterally. Review of the  MIP images confirms the above findings CTA HEAD FINDINGS Anterior circulation: Atherosclerotic calcification in the cavernous carotid bilaterally. Mild stenosis supraclinoid internal carotid artery on the left. No significant right carotid stenosis. Anterior and middle cerebral arteries patent bilaterally without stenosis or occlusion. Posterior circulation: Moderate stenosis of the vertebral artery bilaterally at the skull base. PICA patent bilaterally. Basilar widely patent. AICA, superior cerebellar, posterior cerebral arteries patent without significant stenosis bilaterally. Venous sinuses: Limited venous contrast. Anatomic variants: None Review of the MIP images confirms the above findings CT Brain Perfusion Findings: ASPECTS: 10 CBF (<30%) Volume: 52mL Perfusion (Tmax>6.0s) volume: 71mL Mismatch Volume: 25mL Infarction Location:None IMPRESSION: 1. CT perfusion negative for acute infarct or ischemia 2. Less than 25% diameter stenosis proximal right internal carotid artery. 50% diameter stenosis proximal left internal carotid artery 3. Moderate stenosis at the origin and distal right vertebral artery. Moderate stenosis distal left vertebral artery 4. Negative for intracranial large vessel occlusion. Electronically Signed   By: Franchot Gallo M.D.   On: 08/03/2019 14:46   Dg Chest Portable 1 View  Result Date: 08/03/2019 CLINICAL DATA:  Mental status change. EXAM: PORTABLE CHEST 1 VIEW COMPARISON:  05/10/2014 FINDINGS: Heart size is enlarged. The lungs are clear. There is no pneumothorax. No acute osseous abnormality. Aortic calcifications are noted. IMPRESSION: No active disease. Electronically Signed   By: Constance Holster M.D.   On: 08/03/2019 15:34   Ct Head Code Stroke Wo Contrast  Result Date: 08/03/2019 CLINICAL DATA:  Code stroke.  Ataxia.  Rule out stroke. EXAM: CT HEAD WITHOUT CONTRAST TECHNIQUE: Contiguous axial images were obtained from the base of the skull through the vertex without  intravenous contrast. COMPARISON:  CT head 11/10/2005 FINDINGS: Brain: No evidence of acute infarction, hemorrhage,  hydrocephalus, extra-axial collection or mass lesion/mass effect. Vascular: Negative for hyperdense vessel Skull: Negative Sinuses/Orbits: Negative Other: None ASPECTS ([place] Stroke Program Early CT Score) - Ganglionic level infarction (caudate, lentiform nuclei, internal capsule, insula, M1-M3 cortex): 7 - Supraganglionic infarction (M4-M6 cortex): 3 Total score (0-10 with 10 being normal): 10 IMPRESSION: 1. No acute abnormality 2. ASPECTS is 10 3. These results were called by telephone at the time of interpretation on 08/03/2019 at 2:30 pm to provider Leonel Ramsay, who verbally acknowledged these results. Electronically Signed   By: Franchot Gallo M.D.   On: 08/03/2019 14:30    PHYSICAL EXAM Pleasant elderly mildly obese Caucasian male not in distress. . Afebrile. Head is nontraumatic. Neck is supple without bruit.    Cardiac exam no murmur or gallop. Lungs are clear to auscultation. Distal pulses are well felt. Neurological Exam ;  Awake  Alert oriented x 3.  Nonfluent speech with few paraphasic errors and word hesitancy and difficulty speaking sentences.  Difficulty with naming and repetition.  Good comprehension.[place] movements full without nystagmus.fundi were not visualized.  Right partial homonymous hemianopsia to bedside confrontational testing.Marland [place] Hearing is normal. Palatal movements are normal. Face symmetric. Tongue midline. Normal strength, tone, reflexes and coordination. Normal sensation. Gait deferred.  ASSESSMENT/PLAN Mr. Dan [name] is a 73 y.o. male with history of HTN, HLD, T2DM, and insomnia presenting with confusion and abnormal behavior; found to have right facial droop, right hemianopia, and fairly dense aphasia.    Stroke: Suspect left MCA branch infarct etiology likely embolic  Code Stroke CT head no acute abnormality. ASPECTS 10.     CTA head & neck R ICA < 25%,  L ICA < 50% stenosis. Moderate origin and distal R VA and moderate distal L VA stenosis.    CT perfusion no acute infarct   MRI pending  2D Echo pending   EEG pending    LDL 47  HgbA1c 6.1  Lovenox 40 mg sq daily for VTE prophylaxis  Patient states he was on aspirin 325 mg daily prior to admission (not on med rec), now on aspirin 325 mg daily. Given mild stroke, recommend aspirin 81 mg and plavix 75 mg daily x 3 weeks, then PLAVIX alone. Orders adjusted. (if pt indeed was NOT taking aspirin PTA, can resume aspirin in 3 weeks)  Therapy recommendations:  OP PT; OT and SLP eval pending   Disposition:  pending   Hypertension  BP elevated but Stable . Permissive hypertension (OK if < 220/120) but gradually normalize in 5-7 days . Long-term BP goal normotensive  Hyperlipidemia  Home meds:  crestor 10  Now on creston 40  LDL 47, goal < 70  Continue statin at discharge  Diabetes type II Controlled  HgbA1c 6.1, goal < 7.0  Other Stroke Risk Factors  Advanced age  Overweight,  Body mass index is 28.64 kg/m., recommend weight loss, diet and exercise as appropriate   Other Active Problems  Hypothyroidism   Insomnia on ambien x 17 yrs  Hospital day # 1  I have personally obtained history,examined this patient, reviewed notes, independently viewed imaging studies, participated in medical decision making and plan of care.ROS completed by me personally and pertinent positives fully documented  I have made any additions or clarifications directly to the above note.  Patient presented with sudden onset of confusion and behavioral change and appears to have improved now but does have some mild expressive language difficulties and right-sided peripheral deficits raising suspicion for left MCA branch infarct.  CT  angiogram and CT perfusion scans were unremarkable.  MRI is pending.  Continue ongoing stroke work-up.  Long discussion with the patient, Dr. Lynnae January and internal  medicine team and answered questions.  Greater than 50% time during this 35-minute visit were spent on counseling and coordination of care about embolic stroke and aphasia and answering questions  Antony Contras, MD Medical Director [facility] Pager: [phone] 08/04/2019 1:21 PM   To contact Stroke Continuity provider, please refer to [email or web address] After hours, contact General Neurology

## 2019-08-05 LAB — CBC
HCT: 46.6 % (ref 39.0–52.0)
Hemoglobin: 16.8 g/dL (ref 13.0–17.0)
MCH: 31 pg (ref 26.0–34.0)
MCHC: 36.1 g/dL — ABNORMAL HIGH (ref 30.0–36.0)
MCV: 86 fL (ref 80.0–100.0)
Platelets: 213 10*3/uL (ref 150–400)
RBC: 5.42 MIL/uL (ref 4.22–5.81)
RDW: 11.9 % (ref 11.5–15.5)
WBC: 9.9 10*3/uL (ref 4.0–10.5)
nRBC: 0 % (ref 0.0–0.2)

## 2019-08-05 LAB — BASIC METABOLIC PANEL
Anion gap: 11 (ref 5–15)
BUN: 12 mg/dL (ref 8–23)
CO2: 26 mmol/L (ref 22–32)
Calcium: 9.6 mg/dL (ref 8.9–10.3)
Chloride: 104 mmol/L (ref 98–111)
Creatinine, Ser: 0.85 mg/dL (ref 0.61–1.24)
GFR calc Af Amer: >60 mL/min (ref >60)
GFR calc non Af Amer: >60 mL/min (ref >60)
Glucose, Bld: 142 mg/dL — ABNORMAL HIGH (ref 70–99)
Potassium: 2.7 mmol/L — CL (ref 3.5–5.1)
Sodium: 141 mmol/L (ref 135–145)

## 2019-08-05 LAB — GLUCOSE, CAPILLARY
Glucose-Capillary: 147 mg/dL — ABNORMAL HIGH (ref 70–99)
Glucose-Capillary: 136 mg/dL — ABNORMAL HIGH (ref 70–99)

## 2019-08-05 LAB — MAGNESIUM: Magnesium: 1.5 mg/dL — ABNORMAL LOW (ref 1.7–2.4)

## 2019-08-05 MED ORDER — BENAZEPRIL HCL 20 MG PO TABS
40.0000 mg | ORAL_TABLET | Freq: Every day | ORAL | Status: DC
  Administered 2019-08-05: 40 mg via ORAL
  Filled 2019-08-05: qty 2

## 2019-08-05 MED ORDER — POTASSIUM CHLORIDE CRYS ER 20 MEQ PO TBCR
40.0000 meq | EXTENDED_RELEASE_TABLET | Freq: Two times a day (BID) | ORAL | Status: DC
  Administered 2019-08-05: 40 meq via ORAL
  Filled 2019-08-05: qty 4

## 2019-08-05 MED ORDER — AMLODIPINE BESYLATE 10 MG PO TABS
10.0000 mg | ORAL_TABLET | Freq: Every day | ORAL | Status: DC
  Administered 2019-08-05: 10 mg via ORAL
  Filled 2019-08-05: qty 1

## 2019-08-05 MED ORDER — LEVETIRACETAM ER 500 MG PO TB24: 500.0000 mg | ORAL_TABLET | Freq: Every day | ORAL | 0 refills | Status: DC

## 2019-08-05 MED ORDER — MAGNESIUM SULFATE 2 GM/50ML IV SOLN
2.0000 g | Freq: Once | INTRAVENOUS | Status: AC
  Administered 2019-08-05: 2 g via INTRAVENOUS
  Filled 2019-08-05: qty 50

## 2019-08-05 MED ORDER — LEVETIRACETAM ER 500 MG PO TB24
500.0000 mg | ORAL_TABLET | Freq: Every day | ORAL | Status: DC
  Administered 2019-08-05: 500 mg via ORAL
  Filled 2019-08-05 (×2): qty 1

## 2019-08-05 NOTE — Plan of Care (Signed)
  Problem: Education: Goal: Knowledge of disease or condition will improve Outcome: Adequate for Discharge Goal: Knowledge of secondary prevention will improve Outcome: Adequate for Discharge Goal: Knowledge of patient specific risk factors addressed and post discharge goals established will improve Outcome: Adequate for Discharge Goal: Individualized Educational Video(s) Outcome: Adequate for Discharge   Problem: Coping: Goal: Will verbalize positive feelings about self Outcome: Adequate for Discharge   Problem: Health Behavior/Discharge Planning: Goal: Ability to manage health-related needs will improve Outcome: Adequate for Discharge   Problem: Self-Care: Goal: Ability to participate in self-care as condition permits will improve Outcome: Adequate for Discharge   Problem: Nutrition: Goal: Risk of aspiration will decrease Outcome: Adequate for Discharge   Problem: Ischemic Stroke/TIA Tissue Perfusion: Goal: Complications of ischemic stroke/TIA will be minimized Outcome: Adequate for Discharge   Problem: Education: Goal: Knowledge of General Education information will improve Description: Including pain rating scale, medication(s)/side effects and non-pharmacologic comfort measures Outcome: Adequate for Discharge   Problem: Health Behavior/Discharge Planning: Goal: Ability to manage health-related needs will improve Outcome: Adequate for Discharge   Problem: Clinical Measurements: Goal: Ability to maintain clinical measurements within normal limits will improve Outcome: Adequate for Discharge Goal: Will remain Dan [name] from infection Outcome: Adequate for Discharge Goal: Diagnostic test results will improve Outcome: Adequate for Discharge Goal: Respiratory complications will improve Outcome: Adequate for Discharge Goal: Cardiovascular complication will be avoided Outcome: Adequate for Discharge   Problem: Activity: Goal: Risk for activity intolerance will  decrease Outcome: Adequate for Discharge   Problem: Nutrition: Goal: Adequate nutrition will be maintained Outcome: Adequate for Discharge   Problem: Coping: Patient stable, discussed POC with patient, agreeable with plan, denies question/concerns at this time.   Goal: Level of anxiety will decrease Outcome: Adequate for Discharge   Problem: Elimination: Goal: Will not experience complications related to bowel motility Outcome: Adequate for Discharge Goal: Will not experience complications related to urinary retention Outcome: Adequate for Discharge   Problem: Pain Managment: Goal: General experience of comfort will improve Outcome: Adequate for Discharge   Problem: Safety: Goal: Ability to remain Dan [name] from injury will improve Outcome: Adequate for Discharge   Problem: Skin Integrity: Goal: Risk for impaired skin integrity will decrease Outcome: Adequate for Discharge

## 2019-08-05 NOTE — Evaluation (Signed)
Occupational Therapy Evaluation Patient Details Name: Dan [name] MRN: [id number] DOB: [birth year] Today's Date: 08/05/2019    History of Present Illness 73 y.o. male admitted on 08/03/19 for confusion, aphasia.  Pt with suspected stroke.  No tPA given.  CT was negative, MRI is pending (needs sedative to tolerate).  EEG negative for seizure.  Pt with significant PMH of HTN, DM, bladder CA, eye surgery.     Clinical Impression   Pt admitted with above diagnoses, presenting with visual deficits, decreased cognition, and higher level balance deficits. 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 At time of eval he is over mod I for bed mobility and min guard for transfers and functional mobility. Visual assessment completed as noted below. Noted R inferior quadranopsia. Pt is able to state his vision feels "off", but cannot clarify. Gave pt compensatory techniques of head turning for safety and environment navigation. Also gave pt word search for continued practice. Noted higher level deficits with cognition. Given current status, recommend OP OT. Will continue to follow per POC listed below.    Follow Up Recommendations  Outpatient OT;Supervision - Intermittent    Equipment Recommendations  3 in 1 bedside commode    Recommendations for Other Services       Precautions / Restrictions Precautions Precautions: Fall Precaution Comments: unsteady, R visual field deficit Restrictions Weight Bearing Restrictions: No      Mobility Bed Mobility Overal bed mobility: Modified Independent             General bed mobility comments: extra time, but no external assist  Transfers Overall transfer level: Needs assistance   Transfers: Sit to/from Stand Sit to Stand: Min guard         General transfer comment: min guard for safety    Balance Overall balance assessment: Needs assistance Sitting-balance support: No upper extremity supported;Feet supported Sitting balance-Leahy  Scale: Good     Standing balance support: No upper extremity supported;During functional activity Standing balance-Leahy Scale: Fair                             ADL either performed or assessed with clinical judgement   ADL Overall ADL's : Needs assistance/impaired Eating/Feeding: Set up;Sitting   Grooming: Min guard;Standing;Wash/dry hands   Upper Body Bathing: Min guard;Sitting   Lower Body Bathing: Min guard;Sitting/lateral leans;Sit to/from stand   Upper Body Dressing : Min guard;Sitting   Lower Body Dressing: Min guard;Sitting/lateral leans;Sit to/from stand   Toilet Transfer: Public affairs consultant Details (indicate cue type and reason): sat on regular toilet to urinate Toileting- Clothing Manipulation and Hygiene: Set up;Sitting/lateral lean;Sit to/from stand   Tub/ Banker: Min guard;3 in 1;Ambulation   Functional mobility during ADLs: Min guard;Cueing for safety General ADL Comments: pt limited by R inferior visual deficits, cognitive deficits, and higher level balance deficits     Vision Baseline Vision/History: Wears glasses Wears Glasses: Reading only Patient Visual Report: Other (comment)(able to state something is off but cannot specify) Vision Assessment?: Yes Ocular Range of Motion: Restricted on the right Alignment/Gaze Preference: Within Defined Limits Tracking/Visual Pursuits: Decreased smoothness of horizontal tracking;Decreased smoothness of eye movement to RIGHT inferior field Visual Fields: Right inferior homonymous quadranopsia;Right visual field deficit     Perception     Praxis      Pertinent Vitals/Pain Pain Assessment: No/denies pain     Hand Dominance Right   Extremity/Trunk Assessment Upper Extremity Assessment Upper Extremity Assessment:  Overall WFL for tasks assessed           Communication Communication Communication: Expressive difficulties   Cognition Arousal/Alertness:  Awake/alert Behavior During Therapy: WFL for tasks assessed/performed Overall Cognitive Status: Impaired/Different from baseline Area of Impairment: Safety/judgement;Awareness;Problem solving                         Safety/Judgement: Decreased awareness of safety;Decreased awareness of deficits Awareness: Intellectual Problem Solving: Slow processing General Comments: slow processing, states that it is more difficult to concentrate   General Comments       Exercises     Shoulder Instructions      Home Living Family/patient expects to be discharged to:: Private residence Living Arrangements: Spouse/significant other Available Help at Discharge: Family;Available PRN/intermittently Type of Home: House       Home Layout: One level     Bathroom Shower/Tub: Teacher, early years/pre: Standard     Home Equipment: None          Prior Functioning/Environment Level of Independence: Independent        Comments: manages property at baseline        OT Problem List: Impaired vision/perception;Decreased knowledge of use of DME or AE;Decreased activity tolerance;Decreased cognition;Impaired balance (sitting and/or standing);Decreased safety awareness      OT Treatment/Interventions: Self-care/ADL training;Visual/perceptual remediation/compensation;Therapeutic exercise;Patient/family education;Neuromuscular education;Balance training;Therapeutic activities;DME and/or AE instruction;Cognitive remediation/compensation    OT Goals(Current goals can be found in the care plan section) Acute Rehab OT Goals Patient Stated Goal: go home today OT Goal Formulation: With patient Time For Goal Achievement: 08/19/19 Potential to Achieve Goals: Good  OT Frequency: Min 2X/week   Barriers to D/C:            Co-evaluation              AM-PAC OT "6 Clicks" Daily Activity     Outcome Measure Help from another person eating meals?: A Little Help from another  person taking care of personal grooming?: A Little Help from another person toileting, which includes using toliet, bedpan, or urinal?: A Little Help from another person bathing (including washing, rinsing, drying)?: A Little Help from another person to put on and taking off regular upper body clothing?: A Little Help from another person to put on and taking off regular lower body clothing?: A Little 6 Click Score: 18   End of Session Equipment Utilized During Treatment: Gait belt Nurse Communication: Mobility status  Activity Tolerance: Patient tolerated treatment well Patient left: in chair;with call bell/phone within reach;with chair alarm set  OT Visit Diagnosis: Other abnormalities of gait and mobility (R26.89);Low vision, both eyes (H54.2);Other symptoms and signs involving cognitive function                Time: 1023-1059 OT Time Calculation (min): 36 min Charges:  OT General Charges $OT Visit: 1 Visit OT Evaluation $OT Eval Low Complexity: 1 Low OT Treatments $Self Care/Home Management : 8-22 mins  Zenovia Jarred, MSOT, OTR/L Behavioral Health OT/ Acute Relief OT [facility] Office: [phone]  Zenovia Jarred 08/05/2019, 12:38 PM

## 2019-08-05 NOTE — Progress Notes (Signed)
   Subjective:  Patient resting in bed reports he is feeling better.  Discussed negative MRI findings and EEG findings.  [name] in room and discussed plan with patient.  Patient will follow-up with [name] in clinic.  Objective:  Vital signs in last 24 hours: Vitals:   08/04/19 0902 08/04/19 1200 08/04/19 1600 08/04/19 2305  BP: (!) 186/95 (!) 199/90 (!) 171/91 (!) 185/105  Pulse: 88 81 83 80  Resp:  20 20 17   Temp:  97.8 F (36.6 C) 98.1 F (36.7 C) 97.9 F (36.6 C)  TempSrc:  Oral Oral Oral  SpO2: 97% 99% 97% 95%  Weight:      Height:       General: Working with PT on approach , ambulating with assistance.  CV: RRR, no murmurs rubs or gallops Neuro:Patient is awake, alert, oriented to self, place, situation , and time Agraphia improved, some anomic aphasia but improving. Right homonymous hemianopsia improved    Assessment/Plan:  Active Problems:   Encephalopathy acute  Dan [name] is a 73 year old male with HTN, HLD, T2DM, and insomnia who presented to ED as a code stroke.   # Unwitnessed seizure w/ postictal confusion Patient presented with sympotms of stroke, but MRI negative. Repeat EEG shows focal left temporal slowing. Evaluated by neurology, appreciate the consult.  - started on Kepra 500mg  XR dailey - Follow up with [name] in 1 week  #Hypothyroidism - TSH normal -Continue levothyroxine  #Type II DM  - well controlled based on A1C of 6.1 -Lantus 20 units --SSI  Dispo: Anticipated discharge in approximately 1-2 day(s).   [name], MD PGY1  [phone]

## 2019-08-05 NOTE — TOC Transition Note (Signed)
Transition of Care [facility]) - CM/SW Discharge Note   Patient Details  Name: Dan [name] MRN: [id number] Date of Birth: 02/09/1946  [facility] CM/SW Contact:  Pollie Friar, RN Phone Number: 08/05/2019, 12:31 PM   Clinical Narrative:    Pt discharging home with outpatient therapy. Pt asking for an outpatient therapy near him in [facility] or near [place]. CM found [facility] in [place] has the 2 therapies he needs. Pt is agreeable. CM is unable to schedule the therapy today d/t they are closed for the holiday. CM will send his information in on Monday.  Pt has transportation home.   Final next level of care: OP Rehab Barriers to Discharge: No Barriers Identified   Patient Goals and CMS Choice     Choice offered to / list presented to : Patient  Discharge Placement                       Discharge Plan and Services                DME Arranged: 3-N-1 DME Agency: [employer] Date DME Agency Contacted: 08/05/19   Representative spoke with at DME Agency: Bertrum Sol            Social Determinants of Health ([place]) Interventions     Readmission Risk Interventions No flowsheet data found.

## 2019-08-05 NOTE — Procedures (Addendum)
Patient Name: Dan [name]  MRN: [id number]  Epilepsy Attending: Lora Havens  Referring Physician/Provider: Dr. Antony Contras Date: 08/04/2019 Duration: 22.19 minutes  Patient history: 73 year old male who presented with right facial droop, right hemianopia and and aphasia.  EEG to evaluate for seizures.  Level of alertness: Awake  AEDs during EEG study: None  Technical aspects: This EEG study was done with scalp electrodes positioned according to the 10-20 International system of electrode placement. Electrical activity was acquired at a sampling rate of 500Hz  and reviewed with a high frequency filter of 70Hz  and a low frequency filter of 1Hz . EEG data were recorded continuously and digitally stored.   DESCRIPTION:  The posterior dominant rhythm consists of 8 Hz activity of moderate voltage (25-35 uV) seen predominantly in posterior head regions, symmetric and reactive to eye opening and eye closing.  Intermittent 2 to 3 Hz delta slowing was seen in left temporal region.  Hyperventilation and photic stimulation were not performed.  Abnormality -Intermittent slow, left temporal  IMPRESSION: This study is suggestive of nonspecific cortical dysfunction in left temporal region.  No seizures or epileptiform discharges were seen throughout the recording.  Corabelle Spackman [name]

## 2019-08-05 NOTE — Progress Notes (Signed)
STROKE TEAM PROGRESS NOTE   INTERVAL HISTORY Patient presented with sudden onset of confusion and was found to have aphasia and right-sided peripheral vision loss. Marland Kitchen MRI scan of the brain is negative for acute infarct.  Patient's confusion and right-sided neglect appear to be improving today but he feels is not back at baseline.  Repeat EEG was done and shows focal left temporal 3 to 4 Hz slowing raising concern for patient having had an unwitnessed seizure followed by postictal confusion  Vitals:   08/04/19 0902 08/04/19 1200 08/04/19 1600 08/04/19 2305  BP: (!) 186/95 (!) 199/90 (!) 171/91 (!) 185/105  Pulse: 88 81 83 80  Resp:  20 20 17   Temp:  97.8 F (36.6 C) 98.1 F (36.7 C) 97.9 F (36.6 C)  TempSrc:  Oral Oral Oral  SpO2: 97% 99% 97% 95%  Weight:      Height:        CBC:  Recent Labs  Lab 08/03/19 1411  08/04/19 0711 08/05/19 0329  WBC 9.0  --  9.0 9.9  NEUTROABS 6.8  --   --   --   HGB 17.1*   < > 16.7 16.8  HCT 48.5   < > 47.4 46.6  MCV 88.0  --  87.8 86.0  PLT 214  --  221 213   < > = values in this interval not displayed.    Basic Metabolic Panel:  Recent Labs  Lab 08/04/19 0711 08/05/19 0329  NA 142 141  K 2.9* 2.7*  CL 106 104  CO2 24 26  GLUCOSE 142* 142*  BUN 7* 12  CREATININE 0.73 0.85  CALCIUM 9.3 9.6  MG  --  1.5*   Lipid Panel:     Component Value Date/Time   CHOL 126 08/04/2019 0711   TRIG 242 (H) 08/04/2019 0711   HDL 31 (L) 08/04/2019 0711   CHOLHDL 4.1 08/04/2019 0711   VLDL 48 (H) 08/04/2019 0711   LDLCALC 47 08/04/2019 0711   HgbA1c:  Lab Results  Component Value Date   HGBA1C 6.1 (H) 08/04/2019   Urine Drug Screen:     Component Value Date/Time   LABOPIA POSITIVE (A) 08/03/2019 1900   COCAINSCRNUR NONE DETECTED 08/03/2019 1900   LABBENZ POSITIVE (A) 08/03/2019 1900   AMPHETMU NONE DETECTED 08/03/2019 1900   THCU NONE DETECTED 08/03/2019 1900   LABBARB NONE DETECTED 08/03/2019 1900    Alcohol Level     Component  Value Date/Time   ETH <10 08/03/2019 1411    IMAGING Ct Angio Head W Or Wo Contrast  Result Date: 08/03/2019 CLINICAL DATA:  Left sided weakness.  Ataxia. EXAM: CT ANGIOGRAPHY HEAD AND NECK CT PERFUSION BRAIN TECHNIQUE: Multidetector CT imaging of the head and neck was performed using the standard protocol during bolus administration of intravenous contrast. Multiplanar CT image reconstructions and MIPs were obtained to evaluate the vascular anatomy. Carotid stenosis measurements (when applicable) are obtained utilizing NASCET criteria, using the distal internal carotid diameter as the denominator. Multiphase CT imaging of the brain was performed following IV bolus contrast injection. Subsequent parametric perfusion maps were calculated using RAPID software. CONTRAST:  117mL OMNIPAQUE IOHEXOL 350 MG/ML SOLN COMPARISON:  CT head 08/03/2019 FINDINGS: CTA NECK FINDINGS Aortic arch: Standard branching. Imaged portion shows no evidence of aneurysm or dissection. No significant stenosis of the major arch vessel origins. Right carotid system: Atherosclerotic disease at the carotid bifurcation. Less than 25% diameter stenosis right internal carotid artery. Moderate to severe stenosis origin of  right external carotid artery. Left carotid system: Atherosclerotic disease of the carotid bifurcation. 50% diameter stenosis proximal left internal carotid artery. Vertebral arteries: Calcific plaque and moderate stenosis origin of right vertebral artery. Moderate stenosis distal right vertebral artery. Moderate stenosis distal left vertebral artery. Origin of left vertebral artery widely patent. Skeleton: Cervical spine degenerative change. No acute skeletal abnormality. Other neck: Negative for mass or adenopathy in the neck. Upper chest: Lung apices clear bilaterally. Review of the MIP images confirms the above findings CTA HEAD FINDINGS Anterior circulation: Atherosclerotic calcification in the cavernous carotid  bilaterally. Mild stenosis supraclinoid internal carotid artery on the left. No significant right carotid stenosis. Anterior and middle cerebral arteries patent bilaterally without stenosis or occlusion. Posterior circulation: Moderate stenosis of the vertebral artery bilaterally at the skull base. PICA patent bilaterally. Basilar widely patent. AICA, superior cerebellar, posterior cerebral arteries patent without significant stenosis bilaterally. Venous sinuses: Limited venous contrast. Anatomic variants: None Review of the MIP images confirms the above findings CT Brain Perfusion Findings: ASPECTS: 10 CBF (<30%) Volume: 41mL Perfusion (Tmax>6.0s) volume: 4mL Mismatch Volume: 43mL Infarction Location:None IMPRESSION: 1. CT perfusion negative for acute infarct or ischemia 2. Less than 25% diameter stenosis proximal right internal carotid artery. 50% diameter stenosis proximal left internal carotid artery 3. Moderate stenosis at the origin and distal right vertebral artery. Moderate stenosis distal left vertebral artery 4. Negative for intracranial large vessel occlusion. Electronically Signed   By: Franchot Gallo M.D.   On: 08/03/2019 14:46   Ct Angio Neck W Or Wo Contrast  Result Date: 08/03/2019 CLINICAL DATA:  Left sided weakness.  Ataxia. EXAM: CT ANGIOGRAPHY HEAD AND NECK CT PERFUSION BRAIN TECHNIQUE: Multidetector CT imaging of the head and neck was performed using the standard protocol during bolus administration of intravenous contrast. Multiplanar CT image reconstructions and MIPs were obtained to evaluate the vascular anatomy. Carotid stenosis measurements (when applicable) are obtained utilizing NASCET criteria, using the distal internal carotid diameter as the denominator. Multiphase CT imaging of the brain was performed following IV bolus contrast injection. Subsequent parametric perfusion maps were calculated using RAPID software. CONTRAST:  110mL OMNIPAQUE IOHEXOL 350 MG/ML SOLN COMPARISON:  CT  head 08/03/2019 FINDINGS: CTA NECK FINDINGS Aortic arch: Standard branching. Imaged portion shows no evidence of aneurysm or dissection. No significant stenosis of the major arch vessel origins. Right carotid system: Atherosclerotic disease at the carotid bifurcation. Less than 25% diameter stenosis right internal carotid artery. Moderate to severe stenosis origin of right external carotid artery. Left carotid system: Atherosclerotic disease of the carotid bifurcation. 50% diameter stenosis proximal left internal carotid artery. Vertebral arteries: Calcific plaque and moderate stenosis origin of right vertebral artery. Moderate stenosis distal right vertebral artery. Moderate stenosis distal left vertebral artery. Origin of left vertebral artery widely patent. Skeleton: Cervical spine degenerative change. No acute skeletal abnormality. Other neck: Negative for mass or adenopathy in the neck. Upper chest: Lung apices clear bilaterally. Review of the MIP images confirms the above findings CTA HEAD FINDINGS Anterior circulation: Atherosclerotic calcification in the cavernous carotid bilaterally. Mild stenosis supraclinoid internal carotid artery on the left. No significant right carotid stenosis. Anterior and middle cerebral arteries patent bilaterally without stenosis or occlusion. Posterior circulation: Moderate stenosis of the vertebral artery bilaterally at the skull base. PICA patent bilaterally. Basilar widely patent. AICA, superior cerebellar, posterior cerebral arteries patent without significant stenosis bilaterally. Venous sinuses: Limited venous contrast. Anatomic variants: None Review of the MIP images confirms the above findings CT Brain Perfusion Findings:  ASPECTS: 10 CBF (<30%) Volume: 49mL Perfusion (Tmax>6.0s) volume: 44mL Mismatch Volume: 60mL Infarction Location:None IMPRESSION: 1. CT perfusion negative for acute infarct or ischemia 2. Less than 25% diameter stenosis proximal right internal carotid  artery. 50% diameter stenosis proximal left internal carotid artery 3. Moderate stenosis at the origin and distal right vertebral artery. Moderate stenosis distal left vertebral artery 4. Negative for intracranial large vessel occlusion. Electronically Signed   By: Franchot Gallo M.D.   On: 08/03/2019 14:46   Mr Brain Wo Contrast  Result Date: 08/04/2019 CLINICAL DATA:  Focal neuro deficit, greater than 6 hours, stroke suspected. EXAM: MRI HEAD WITHOUT CONTRAST TECHNIQUE: Multiplanar, multiecho pulse sequences of the brain and surrounding structures were obtained without intravenous contrast. COMPARISON:  CT angiogram head 08/03/2019, noncontrast head CT 08/03/2019. FINDINGS: Brain: There is no evidence of acute infarct. No evidence of intracranial mass. No midline shift or extra-axial fluid collection. No chronic intracranial blood products. Mild scattered T2/FLAIR hyperintensity within the cerebral white matter is nonspecific, but consistent with chronic small vessel ischemic disease. Cerebral volume is normal for age. Vascular: Flow voids maintained within the proximal large arterial vessels. Skull and upper cervical spine: No focal marrow lesion Sinuses/Orbits: Bilateral lens replacements. No significant paranasal sinus disease or mastoid effusion at the imaged levels. IMPRESSION: 1. No evidence of acute intracranial abnormality, including acute infarct. 2. Mild chronic small vessel ischemic disease. Electronically Signed   By: Kellie Simmering DO   On: 08/04/2019 14:11   Ct Cerebral Perfusion W Contrast  Result Date: 08/03/2019 CLINICAL DATA:  Left sided weakness.  Ataxia. EXAM: CT ANGIOGRAPHY HEAD AND NECK CT PERFUSION BRAIN TECHNIQUE: Multidetector CT imaging of the head and neck was performed using the standard protocol during bolus administration of intravenous contrast. Multiplanar CT image reconstructions and MIPs were obtained to evaluate the vascular anatomy. Carotid stenosis measurements (when  applicable) are obtained utilizing NASCET criteria, using the distal internal carotid diameter as the denominator. Multiphase CT imaging of the brain was performed following IV bolus contrast injection. Subsequent parametric perfusion maps were calculated using RAPID software. CONTRAST:  126mL OMNIPAQUE IOHEXOL 350 MG/ML SOLN COMPARISON:  CT head 08/03/2019 FINDINGS: CTA NECK FINDINGS Aortic arch: Standard branching. Imaged portion shows no evidence of aneurysm or dissection. No significant stenosis of the major arch vessel origins. Right carotid system: Atherosclerotic disease at the carotid bifurcation. Less than 25% diameter stenosis right internal carotid artery. Moderate to severe stenosis origin of right external carotid artery. Left carotid system: Atherosclerotic disease of the carotid bifurcation. 50% diameter stenosis proximal left internal carotid artery. Vertebral arteries: Calcific plaque and moderate stenosis origin of right vertebral artery. Moderate stenosis distal right vertebral artery. Moderate stenosis distal left vertebral artery. Origin of left vertebral artery widely patent. Skeleton: Cervical spine degenerative change. No acute skeletal abnormality. Other neck: Negative for mass or adenopathy in the neck. Upper chest: Lung apices clear bilaterally. Review of the MIP images confirms the above findings CTA HEAD FINDINGS Anterior circulation: Atherosclerotic calcification in the cavernous carotid bilaterally. Mild stenosis supraclinoid internal carotid artery on the left. No significant right carotid stenosis. Anterior and middle cerebral arteries patent bilaterally without stenosis or occlusion. Posterior circulation: Moderate stenosis of the vertebral artery bilaterally at the skull base. PICA patent bilaterally. Basilar widely patent. AICA, superior cerebellar, posterior cerebral arteries patent without significant stenosis bilaterally. Venous sinuses: Limited venous contrast. Anatomic  variants: None Review of the MIP images confirms the above findings CT Brain Perfusion Findings: ASPECTS: 10 CBF (<30%)  Volume: 47mL Perfusion (Tmax>6.0s) volume: 22mL Mismatch Volume: 79mL Infarction Location:None IMPRESSION: 1. CT perfusion negative for acute infarct or ischemia 2. Less than 25% diameter stenosis proximal right internal carotid artery. 50% diameter stenosis proximal left internal carotid artery 3. Moderate stenosis at the origin and distal right vertebral artery. Moderate stenosis distal left vertebral artery 4. Negative for intracranial large vessel occlusion. Electronically Signed   By: Franchot Gallo M.D.   On: 08/03/2019 14:46   Dg Chest Portable 1 View  Result Date: 08/03/2019 CLINICAL DATA:  Mental status change. EXAM: PORTABLE CHEST 1 VIEW COMPARISON:  05/10/2014 FINDINGS: Heart size is enlarged. The lungs are clear. There is no pneumothorax. No acute osseous abnormality. Aortic calcifications are noted. IMPRESSION: No active disease. Electronically Signed   By: Constance Holster M.D.   On: 08/03/2019 15:34   Ct Head Code Stroke Wo Contrast  Result Date: 08/03/2019 CLINICAL DATA:  Code stroke.  Ataxia.  Rule out stroke. EXAM: CT HEAD WITHOUT CONTRAST TECHNIQUE: Contiguous axial images were obtained from the base of the skull through the vertex without intravenous contrast. COMPARISON:  CT head 11/10/2005 FINDINGS: Brain: No evidence of acute infarction, hemorrhage, hydrocephalus, extra-axial collection or mass lesion/mass effect. Vascular: Negative for hyperdense vessel Skull: Negative Sinuses/Orbits: Negative Other: None ASPECTS ([place] Stroke Program Early CT Score) - Ganglionic level infarction (caudate, lentiform nuclei, internal capsule, insula, M1-M3 cortex): 7 - Supraganglionic infarction (M4-M6 cortex): 3 Total score (0-10 with 10 being normal): 10 IMPRESSION: 1. No acute abnormality 2. ASPECTS is 10 3. These results were called by telephone at the time of interpretation on  08/03/2019 at 2:30 pm to provider Leonel Ramsay, who verbally acknowledged these results. Electronically Signed   By: Franchot Gallo M.D.   On: 08/03/2019 14:30    PHYSICAL EXAM Pleasant elderly mildly obese Caucasian male not in distress. . Afebrile. Head is nontraumatic. Neck is supple without bruit.    Cardiac exam no murmur or gallop. Lungs are clear to auscultation. Distal pulses are well felt. Neurological Exam ;  Awake  Alert oriented x 3.  Nonfluent speech with few paraphasic errors and word hesitancy and difficulty speaking sentences.  Difficulty with naming and repetition.  Good comprehension.Marland [place] movements full without nystagmus.fundi were not visualized.  Right partial homonymous hemianopsia to bedside confrontational testing.Marland Kitchen Hearing is normal. Palatal movements are normal. Face symmetric. Tongue midline. Normal strength, tone, reflexes and coordination. Normal sensation. Gait deferred.  ASSESSMENT/PLAN Mr. Dan [name] is a 73 y.o. male with history of HTN, HLD, T2DM, and insomnia presenting with confusion and abnormal behavior; found to have right facial droop, right hemianopia, and fairly dense aphasia.    Stroke like episode presenting with confusion followed by speech and right-sided vision difficulties.  MRI negative for stroke and EEG shows focal left temporal slowing raising concern for unwitnessed seizure followed by postictal confusion    Code Stroke CT head no acute abnormality. ASPECTS 10.     CTA head & neck R ICA < 25%, L ICA < 50% stenosis. Moderate origin and distal R VA and moderate distal L VA stenosis.    CT perfusion no acute infarct   MRI pending  2D Echo normal ejection fraction.  No cardiac source embolism   EEG 3 to 4 Hz left temporal slowing  LDL 47  HgbA1c 6.1  Lovenox 40 mg sq daily for VTE prophylaxis  Patient states he was on aspirin 325 mg daily prior to admission (not on med rec), now on aspirin 325 mg daily.  Given mild stroke, recommend  aspirin 81 mg and plavix 75 mg daily x 3 weeks, then PLAVIX alone. Orders adjusted. (if pt indeed was NOT taking aspirin PTA, can resume aspirin in 3 weeks)  Therapy recommendations:  OP PT; OT and SLP eval pending   Disposition:  pending   Hypertension  BP elevated but Stable . Permissive hypertension (OK if < 220/120) but gradually normalize in 5-7 days . Long-term BP goal normotensive  Hyperlipidemia  Home meds:  crestor 10  Now on creston 40  LDL 47, goal < 70  Continue statin at discharge  Diabetes type II Controlled  HgbA1c 6.1, goal < 7.0  Other Stroke Risk Factors  Advanced age  Overweight,  Body mass index is 28.64 kg/m., recommend weight loss, diet and exercise as appropriate   Other Active Problems  Hypothyroidism   Insomnia on ambien x 17 yrs  Hospital day # 2  Patient presented with strokelike episode with confusion, speech and right-sided vision difficulties which appear to be resolving and brain imaging is negative for stroke and EEG shows focal left temporal slowing raising concern for unwitnessed seizure followed by postictal confusional state.  Recommend Keppra XR 500 mg daily.  Patient advised not to drive for 6 months as per [facility].  Follow-up as an outpatient in the stroke clinic in 6 weeks.  Long discussion with the patient, Dr. Court Joy from internal medicine team and answered questions.  Greater than 50% time during this 25-minute visit were spent on counseling and coordination of care about embolic stroke and aphasia and answering questions Stroke team will sign off.  Kindly call for questions. Antony Contras, MD Medical Director [place] Pager: [phone] 08/05/2019 11:08 AMTo contact Stroke Continuity provider, please refer to [email or web address] After hours, contact General Neurology

## 2019-08-07 NOTE — Discharge Summary (Signed)
Name: Dan [name] MRN: [id number] DOB: 05-31-1946 73 y.o. PCP: No primary care provider on file.  Date of Admission: 08/03/2019  2:09 PM Date of Discharge: 08/05/2019 Attending Physician: Dan [name]  Discharge Diagnosis: 1. Unwitnessed seizure  Discharge Medications: Allergies as of 08/05/2019      Reactions   Floxin I.v. In Dextrose 5% [ofloxacin] Other (See Comments)   Central Nervous System effects   Flagyl [metronidazole] Rash   Sulfa Antibiotics Rash      Medication List    TAKE these medications   ALPRAZolam 0.5 MG tablet Commonly known as: XANAX Take 0.5 mg by mouth daily as needed.   amLODipine-benazepril 10-40 MG capsule Commonly known as: LOTREL Take 1 capsule by mouth daily. Notes to patient: Take today.   benazepril 20 MG tablet Commonly known as: LOTENSIN Take 20 mg by mouth daily. Notes to patient: Take tomorrow.   dicyclomine 20 MG tablet Commonly known as: BENTYL Take 20 mg by mouth 3 (three) times daily.   Euthyrox 175 MCG tablet Generic drug: levothyroxine Take 175 mcg by mouth every morning.   furosemide 20 MG tablet Commonly known as: LASIX Take 20 mg by mouth daily as needed.   gabapentin 400 MG capsule Commonly known as: NEURONTIN Take 400 mg by mouth 3 (three) times daily. Notes to patient: Take today.   HYDROcodone-acetaminophen 5-325 MG tablet Commonly known as: NORCO/VICODIN Take 1 tablet by mouth 2 (two) times daily as needed.   Jardiance 25 MG Tabs tablet Generic drug: empagliflozin Take 25 mg by mouth daily.   Lantus SoloStar 100 UNIT/ML Solostar Pen Generic drug: Insulin Glargine Inject 40 Units into the skin daily.   levETIRAcetam 500 MG 24 hr tablet Commonly known as: KEPPRA XR Take 1 tablet (500 mg total) by mouth daily.   metFORMIN 1000 MG tablet Commonly known as: GLUCOPHAGE Take 1,000 mg by mouth 2 (two) times daily. Notes to patient: Take today.   metoprolol succinate 100 MG 24 hr tablet Commonly known  as: TOPROL-XL Take 100 mg by mouth daily. Notes to patient: Take tomorrow.   Narcan 4 MG/0.1ML Liqd nasal spray kit Generic drug: naloxone Place 1 spray into the nose See admin instructions. Administer upon signs of opioid overdose. Call 911. Repeat after 3 minutes if no response.   Ozempic (1 MG/DOSE) 2 MG/1.5ML Sopn Generic drug: Semaglutide (1 MG/DOSE) Inject 0.75 mLs into the skin once a week.   pantoprazole 40 MG tablet Commonly known as: PROTONIX Take 40 mg by mouth daily. Notes to patient: Take today.   rosuvastatin 10 MG tablet Commonly known as: CRESTOR Take 10 mg by mouth at bedtime. Notes to patient: Take tonight   zolpidem 10 MG tablet Commonly known as: AMBIEN Take 10 mg by mouth at bedtime as needed for sleep.       Disposition and follow-up:   Dan [name] was discharged from [facility] in Stable condition.  At the hospital follow up visit please address:  1.   Unwitnessed seizure with postical state  --Patient has follow up with Dan [name] , neurologist, in 1 week  - Started on Keppra  - No driving for 6 months   Hypokalemia - K 2.7, repleted  - Check BM  2.  Labs / imaging needed at time of follow-up: BMP  3.  Pending labs/ test needing follow-up:   Follow-up Appointments: Follow-up Information    Dan Fila, MD. Schedule an appointment as soon as possible for a visit in 6 week(s).  Specialties: Neurology, Radiology Contact information: [address] [place] [address] [facility].   Why: The outpatient therapy will contact you for the first appointment Contact information: [address], [place] [address]  [phone]            Hospital Course by problem list:  #unwitnessed seizure w/postictal confusion Patient presented with confusion followed by dysarthria and right hemianopia. CTA did not show any large vessel stenosis or  occulusion. CT perfusion was negative for stroke. Difficult to obtain MRI of the brain because patient reported claustrophobia. On day 2 patient went for MRI with ativan, MRI negative. Initial EEG showed moderate diffuse encephalopathy. Repeat EEG showed non specific cortical dysfunction in left temporal region. It is likely patient had a seizure and presented with postictal confusion. Started on Keppra 550m XR daily w/ follow up with Dan [name] in 1 week.   Discharge Vitals:   BP (!) 189/102 (BP Location: Left Arm)   Pulse 90   Temp 98 F (36.7 C) (Oral)   Resp 20   Ht 6' (1.829 m)   Wt 95.8 kg   SpO2 97%   BMI 28.64 kg/m   Pertinent Labs, Studies, and Procedures:  CBC Latest Ref Rng & Units 08/05/2019 08/04/2019 08/03/2019  WBC 4.0 - 10.5 K/uL 9.9 9.0 -  Hemoglobin 13.0 - 17.0 g/dL 16.8 16.7 16.7  Hematocrit 39.0 - 52.0 % 46.6 47.4 49.0  Platelets 150 - 400 K/uL 213 221 -   BMP Latest Ref Rng & Units 08/05/2019 08/04/2019 08/03/2019  Glucose 70 - 99 mg/dL 142(H) 142(H) 135(H)  BUN 8 - 23 mg/dL 12 7(L) 6(L)  Creatinine 0.61 - 1.24 mg/dL 0.85 0.73 0.70  Sodium 135 - 145 mmol/L 141 142 145  Potassium 3.5 - 5.1 mmol/L 2.7(LL) 2.9(L) 3.3(L)  Chloride 98 - 111 mmol/L 104 106 102  CO2 22 - 32 mmol/L 26 24 -  Calcium 8.9 - 10.3 mg/dL 9.6 9.3 -   CT ANGIOGRAPHY HEAD AND NECK  CT PERFUSION BRAIN  TECHNIQUE: Multidetector CT imaging of the head and neck was performed using the standard protocol during bolus administration of intravenous contrast. Multiplanar CT image reconstructions and MIPs were obtained to evaluate the vascular anatomy. Carotid stenosis measurements (when applicable) are obtained utilizing NASCET criteria, using the distal internal carotid diameter as the denominator.  Multiphase CT imaging of the brain was performed following IV bolus contrast injection. Subsequent parametric perfusion maps were calculated using RAPID software.  CONTRAST:  1029mOMNIPAQUE  IOHEXOL 350 MG/ML SOLN  COMPARISON:  CT head 08/03/2019  FINDINGS: CTA NECK FINDINGS  Aortic arch: Standard branching. Imaged portion shows no evidence of aneurysm or dissection. No significant stenosis of the major arch vessel origins.  Right carotid system: Atherosclerotic disease at the carotid bifurcation. Less than 25% diameter stenosis right internal carotid artery. Moderate to severe stenosis origin of right external carotid artery.  Left carotid system: Atherosclerotic disease of the carotid bifurcation. 50% diameter stenosis proximal left internal carotid artery.  Vertebral arteries: Calcific plaque and moderate stenosis origin of right vertebral artery. Moderate stenosis distal right vertebral artery.  Moderate stenosis distal left vertebral artery. Origin of left vertebral artery widely patent.  Skeleton: Cervical spine degenerative change. No acute skeletal abnormality.  Other neck: Negative for mass or adenopathy in the neck.  Upper chest: Lung apices clear bilaterally.  Review of the MIP images confirms the above findings  CTA HEAD FINDINGS  Anterior circulation: Atherosclerotic calcification in the cavernous carotid bilaterally. Mild stenosis supraclinoid internal carotid artery on the left. No significant right carotid stenosis. Anterior and middle cerebral arteries patent bilaterally without stenosis or occlusion.  Posterior circulation: Moderate stenosis of the vertebral artery bilaterally at the skull base. PICA patent bilaterally. Basilar widely patent. AICA, superior cerebellar, posterior cerebral arteries patent without significant stenosis bilaterally.  Venous sinuses: Limited venous contrast.  Anatomic variants: None  Review of the MIP images confirms the above findings  CT Brain Perfusion Findings:  ASPECTS: 10  CBF (<30%) Volume: 65m  Perfusion (Tmax>6.0s) volume: 022m Mismatch Volume: 45m66mInfarction  Location:None  IMPRESSION: 1. CT perfusion negative for acute infarct or ischemia 2. Less than 25% diameter stenosis proximal right internal carotid artery. 50% diameter stenosis proximal left internal carotid artery 3. Moderate stenosis at the origin and distal right vertebral artery. Moderate stenosis distal left vertebral artery 4. Negative for intracranial large vessel occlusion.    MRI HEAD WITHOUT CONTRAST  TECHNIQUE: Multiplanar, multiecho pulse sequences of the brain and surrounding structures were obtained without intravenous contrast.  COMPARISON:  CT angiogram head 08/03/2019, noncontrast head CT 08/03/2019.  FINDINGS: Brain:  There is no evidence of acute infarct.  No evidence of intracranial mass.  No midline shift or extra-axial fluid collection.  No chronic intracranial blood products.  Mild scattered T2/FLAIR hyperintensity within the cerebral white matter is nonspecific, but consistent with chronic small vessel ischemic disease.  Cerebral volume is normal for age.  Vascular: Flow voids maintained within the proximal large arterial vessels.  Skull and upper cervical spine: No focal marrow lesion  Sinuses/Orbits: Bilateral lens replacements. No significant paranasal sinus disease or mastoid effusion at the imaged levels.  IMPRESSION: 1. No evidence of acute intracranial abnormality, including acute infarct. 2. Mild chronic small vessel ischemic disease.     Discharge Instructions: Discharge Instructions    Ambulatory referral to Occupational Therapy   Complete by: As directed    Ambulatory referral to Physical Therapy   Complete by: As directed    Diet - low sodium heart healthy   Complete by: As directed    Discharge instructions   Complete by: As directed    Mr. VadClippert was a pleasure taking care of you. You were treated in the hospital for confusion which was most likely caused by a seizure. We have started you on a  medication to help prevent any further episodes in the future and would like you to follow-up with neurology in about 6 weeks. Plan to follow-up with your primary care doctor in the next 1-2 weeks.   Please remember to avoid driving for 6 months as part of [place].   Take care!   Increase activity slowly   Complete by: As directed       Signed: JefTamsen SniderD PGY1  [phone]

## 2019-08-08 ENCOUNTER — Encounter (HOSPITAL_COMMUNITY): Payer: Self-pay

## 2019-08-17 ENCOUNTER — Telehealth (HOSPITAL_COMMUNITY): Payer: Self-pay | Admitting: Neurology

## 2019-08-17 NOTE — Telephone Encounter (Signed)
I  Returned pt`s primary MD  Dr. Martina Sinner call.  She informed me that the patient had called her stating that he developed a rash on Keppra and would like anticonvulsant be switched.  Recommend discontinuing Keppra and starting Depakote ER 500 daily.  Dr. Martina Sinner said that she would call the patient and make the change and arrange for the prescription.  Patient was advised to keep scheduled follow-up appointment in my clinic

## 2019-09-22 ENCOUNTER — Encounter: Payer: Self-pay | Admitting: Neurology

## 2019-09-22 ENCOUNTER — Ambulatory Visit (INDEPENDENT_AMBULATORY_CARE_PROVIDER_SITE_OTHER): Payer: Medicare PPO | Admitting: Neurology

## 2019-09-22 ENCOUNTER — Other Ambulatory Visit: Payer: Self-pay

## 2019-09-22 VITALS — BP 123/76 | HR 87 | Temp 97.4°F | Ht 70.0 in | Wt 210.0 lb

## 2019-09-22 DIAGNOSIS — F05 Delirium due to known physiological condition: Secondary | ICD-10-CM

## 2019-09-22 DIAGNOSIS — G40209 Localization-related (focal) (partial) symptomatic epilepsy and epileptic syndromes with complex partial seizures, not intractable, without status epilepticus: Secondary | ICD-10-CM | POA: Diagnosis not present

## 2019-09-22 NOTE — Patient Instructions (Signed)
I had a long discussion with the patient and his wife regarding his episode of transient confusion along with an abnormal EEG likely representing complex partial seizure and need to be on long-term anticonvulsants.  Continue Depakote ER 500mg  daily.  Repeat EEG.  Patient was advised to avoid seizure provoking factors like medication noncompliance, irregular sleeping and eating habits, stimulants and alcohol and extremes of exertion.  He will return for follow-up in the future in 6 months or call earlier if necessary.  Seizure, Adult A seizure is a sudden burst of abnormal electrical activity in the brain. Seizures usually last from 30 seconds to 2 minutes. They can cause many different symptoms. Usually, seizures are not harmful unless they last a long time. What are the causes? Common causes of this condition include:  Fever or infection.  Conditions that affect the brain, such as: ? A brain abnormality that you were born with. ? A brain or head injury. ? Bleeding in the brain. ? A tumor. ? Stroke. ? Brain disorders such as autism or cerebral palsy.  Low blood sugar.  Conditions that are passed from parent to child (are inherited).  Problems with substances, such as: ? Having a reaction to a drug or a medicine. ? Suddenly stopping the use of a substance (withdrawal). In some cases, the cause may not be known. A person who has repeated seizures over time without a clear cause has a condition called epilepsy. What increases the risk? You are more likely to get this condition if you have:  A family history of epilepsy.  Had a seizure in the past.  A brain disorder.  A history of head injury, lack of oxygen at birth, or strokes. What are the signs or symptoms? There are many types of seizures. The symptoms vary depending on the type of seizure you have. Examples of symptoms during a seizure include:  Shaking (convulsions).  Stiffness in the body.  Passing out (losing  consciousness).  Head nodding.  Staring.  Not responding to sound or touch.  Loss of bladder control and bowel control. Some people have symptoms right before and right after a seizure happens. Symptoms before a seizure may include:  Fear.  Worry (anxiety).  Feeling like you may vomit (nauseous).  Feeling like the room is spinning (vertigo).  Feeling like you saw or heard something before (dj vu).  Odd tastes or smells.  Changes in how you see. You may see flashing lights or spots. Symptoms after a seizure happens can include:  Confusion.  Sleepiness.  Headache.  Weakness on one side of the body. How is this treated? Most seizures will stop on their own in under 5 minutes. In these cases, no treatment is needed. Seizures that last longer than 5 minutes will usually need treatment. Treatment can include:  Medicines given through an IV tube.  Avoiding things that are known to cause your seizures. These can include medicines that you take for another condition.  Medicines to treat epilepsy.  Surgery to stop the seizures. This may be needed if medicines do not help. Follow these instructions at home: Medicines  Take over-the-counter and prescription medicines only as told by your doctor.  Do not eat or drink anything that may keep your medicine from working, such as alcohol. Activity  Do not do any activities that would be dangerous if you had another seizure, like driving or swimming. Wait until your doctor says it is safe for you to do them.  If you live  in the [place] ask your local DMV (department of motor vehicles) when you can drive.  Get plenty of rest. Teaching others Teach friends and family what to do when you have a seizure. They should:  Lay you on the ground.  Protect your head and body.  Loosen any tight clothing around your neck.  Turn you on your side.  Not hold you down.  Not put anything into your mouth.  Know whether or not you  need emergency care.  Stay with you until you are better.  General instructions  Contact your doctor each time you have a seizure.  Avoid anything that gives you seizures.  Keep a seizure diary. Write down: ? What you think caused each seizure. ? What you remember about each seizure.  Keep all follow-up visits as told by your doctor. This is important. Contact a doctor if:  You have another seizure.  You have seizures more often.  There is any change in what happens during your seizures.  You keep having seizures with treatment.  You have symptoms of being sick or having an infection. Get help right away if:  You have a seizure that: ? Lasts longer than 5 minutes. ? Is different than seizures you had before. ? Makes it harder to breathe. ? Happens after you hurt your head.  You have any of these symptoms after a seizure: ? Not being able to speak. ? Not being able to use a part of your body. ? Confusion. ? A bad headache.  You have two or more seizures in a row.  You do not wake up right after a seizure.  You get hurt during a seizure. These symptoms may be an emergency. Do not wait to see if the symptoms will go away. Get medical help right away. Call your local emergency services (911 in the [place] Do not drive yourself to the hospital. Summary  Seizures usually last from 30 seconds to 2 minutes. Usually, they are not harmful unless they last a long time.  Do not eat or drink anything that may keep your medicine from working, such as alcohol.  Teach friends and family what to do when you have a seizure.  Contact your doctor each time you have a seizure. This information is not intended to replace advice given to you by your health care provider. Make sure you discuss any questions you have with your health care provider. Document Revised: 11/12/2018 Document Reviewed: 11/12/2018 Elsevier Patient Education  [place].

## 2019-09-22 NOTE — Progress Notes (Signed)
[facility] [address] [place]. [place] [address] [phone]       OFFICE FOLLOW-UP NOTE  Dan [name] Date of Birth:  [birth year] Medical Record Number:  [id number]   HPI: Dan [name] is a 74 year old pleasant Caucasian male seen today for initial office follow-up visit following hospital admission for confusion episode.  History is obtained from the patient, his wife as well as review of electronic medical records and I personally reviewed imaging films in PACS.  He has a past medical history of hypertension, diabetes, hyperlipidemia he woke up in the middle of the night at 2 AM on 08/03/2019 and his wife found him beating up on the wall telling her that he was trying to open up the door which was not there.  She eventually got him back to go to bed.  She slept in a different room and noticed that he was still up and down all night long.  Next day she found him to be quite confused and he went to use the restroom and was not able to wipe himself clean.  He had urinated over the floor she called EMS his blood glucose was not found to be low.  He was found to be confused by EMS and brought to the hospital.  MRI scan of the brain was negative for acute infarct but EEG shows 3 to 4 Hz delta range slowing in the left temporal lobe.  Patient was felt to have had seizure with postictal confusion.  He was started on Keppra for seizure prophylaxis.  Patient states he did confusion and recovered the next day.  He did not have good memory of the event.  He did well after he went home but developed some itching and possibly a rash on Keppra and call the office and was asked to stop it and switch to Depakote ER 500 daily which is tolerating well without any side effects.  He has had no further episodes of confusion and disorientation.  He has no prior history of seizures, significant head injury with loss of consciousness, meningitis, encephalitis, strokes or other neurological problems.   There is no family history of epilepsy or seizures  ROS:   14 system review of systems is positive for confusion, disorientation, memory loss, itching, rash and all other systems negative  PMH:  Past Medical History:  Diagnosis Date  . Abdominal pain   . Anxiety   . Back pain   . Bladder cancer ([place]) 2010  . Bladder cancer ([place])   . COLONIC POLYPS, ADENOMATOUS, HX OF 11/10/2009   10/2009 -  serrated adenoma     . Diabetes mellitus   . Diabetes mellitus without complication ([place])   . Diverticulosis   . High cholesterol   . Hx of Clostridium difficile infection    SEPT 2015 - TAKING VANCOMYCIN SINCE   . Hyperlipidemia   . Hypertension   . Hypothyroidism   . Insomnia   . OSA (obstructive sleep apnea)   . Pneumonia    hx of  . Rash    INNER LEGS    Social History:  Social History   Socioeconomic History  . Marital status: Married    Spouse name: Not on file  . Number of children: 2  . Years of education: Not on file  . Highest education level: Not on file  Occupational History  . Occupation: retired    Fish farm manager: RETIRED  Tobacco Use  . Smoking status: Never Smoker  . Smokeless  tobacco: Never Used  Substance and Sexual Activity  . Alcohol use: Never  . Drug use: Never  . Sexual activity: Not on file  Other Topics Concern  . Not on file  Social History Narrative   ** Merged History Encounter **       Social Determinants of Health   Financial Resource Strain:   . Difficulty of Paying Living Expenses: Not on file  Food Insecurity:   . Worried About Charity fundraiser in the Last Year: Not on file  . Ran Out of Food in the Last Year: Not on file  Transportation Needs:   . Lack of Transportation (Medical): Not on file  . Lack of Transportation (Non-Medical): Not on file  Physical Activity:   . Days of Exercise per Week: Not on file  . Minutes of Exercise per Session: Not on file  Stress:   . Feeling of Stress : Not on file  Social Connections:   .  Frequency of Communication with Friends and Family: Not on file  . Frequency of Social Gatherings with Friends and Family: Not on file  . Attends Religious Services: Not on file  . Active Member of Clubs or Organizations: Not on file  . Attends Archivist Meetings: Not on file  . Marital Status: Not on file  Intimate Partner Violence:   . Fear of Current or Ex-Partner: Not on file  . Emotionally Abused: Not on file  . Physically Abused: Not on file  . Sexually Abused: Not on file    Medications:   Current Outpatient Medications on File Prior to Visit  Medication Sig Dispense Refill  . acetaminophen (TYLENOL) 500 MG tablet Take 1,000 mg by mouth every 6 (six) hours as needed for moderate pain (back pain).    Marland Kitchen ALPRAZolam (XANAX) 0.5 MG tablet Take 1 tablet (0.5 mg total) by mouth 2 (two) times daily as needed for anxiety. 60 tablet 0  . amLODipine-benazepril (LOTREL) 10-20 MG capsule     . ASPIRIN 81 PO Take by mouth.    Marland Kitchen atorvastatin (LIPITOR) 40 MG tablet Take 40 mg by mouth daily.    . benazepril (LOTENSIN) 20 MG tablet Take 20 mg by mouth daily.    Marland Kitchen CINNAMON PO Take 1,000 mg by mouth daily.    Marland Kitchen dicyclomine (BENTYL) 20 MG tablet Take by mouth.    . divalproex (DEPAKOTE ER) 500 MG 24 hr tablet Take by mouth.    . empagliflozin (JARDIANCE) 25 MG TABS tablet Take by mouth.    . EUTHYROX 175 MCG tablet Take 175 mcg by mouth every morning.    . furosemide (LASIX) 20 MG tablet Take 20 mg by mouth daily as needed.    . gabapentin (NEURONTIN) 300 MG capsule Take 300 mg by mouth 3 (three) times daily.    Marland Kitchen HYDROcodone-acetaminophen (NORCO/VICODIN) 5-325 MG tablet TAKE ONE TABLET BY MOUTH 2 (TWO) TIMES A DAY AS NEEDED FOR PAIN.  0  . ibuprofen (ADVIL,MOTRIN) 400 MG tablet Take 1 tablet (400 mg total) by mouth every 6 (six) hours as needed. 30 tablet 0  . Insulin Glargine (LANTUS SOLOSTAR) 100 UNIT/ML Solostar Pen INJECT 40 UNITS SUBCUTANEOUSLY ONCE DAILY IN THE EVEING AT 8PM    .  JARDIANCE 25 MG TABS tablet Take 25 mg by mouth daily.    Marland Kitchen levothyroxine (SYNTHROID) 175 MCG tablet Take by mouth.    . lidocaine (LIDODERM) 5 % Place 1 patch onto the skin daily. Remove & Discard patch  within 12 hours or as directed by MD 30 patch 0  . meclizine (ANTIVERT) 25 MG tablet TAKE 1 TABLET BY MOUTH AT BEDTIME    . metFORMIN (GLUCOPHAGE) 1000 MG tablet Take 1 tablet by mouth twice daily    . methocarbamol (ROBAXIN) 500 MG tablet Take 1 tablet (500 mg total) by mouth 2 (two) times daily. 20 tablet 0  . metoprolol succinate (TOPROL-XL) 100 MG 24 hr tablet Take by mouth.    . Multiple Vitamin (MULTIVITAMIN WITH MINERALS) TABS Take 1 tablet by mouth daily.    . naloxone (NARCAN) nasal spray 4 mg/0.1 mL Place into the nose.    Marland Kitchen OZEMPIC, 1 MG/DOSE, 2 MG/1.5ML SOPN Inject 0.75 mLs into the skin once a week.    . pantoprazole (PROTONIX) 40 MG tablet Take 1 tablet by mouth once daily    . rosuvastatin (CRESTOR) 10 MG tablet Take 10 mg by mouth at bedtime.    . sitaGLIPtin (JANUVIA) 100 MG tablet Take by mouth.    . tadalafil (CIALIS) 5 MG tablet Take 5 mg by mouth daily as needed for erectile dysfunction.    Marland Kitchen zolpidem (AMBIEN) 10 MG tablet Take 1 tablet (10 mg total) by mouth at bedtime as needed for sleep. 30 tablet 0   No current facility-administered medications on file prior to visit.    Allergies:   Allergies  Allergen Reactions  . Floxin [Ofloxacin] Other (See Comments)    Central Nervous System effects  . Metronidazole Rash  . Floxin I.V. In Dextrose 5% [Ofloxacin] Other (See Comments)    Central Nervous System effects  . Keppra [Levetiracetam]     Itching ? rash  . Flagyl [Metronidazole Hcl] Rash  . Sulfa Antibiotics Rash  . Sulfa Antibiotics Rash    Physical Exam General: Mildly obese elderly Caucasian male, seated, in no evident distress Head: head normocephalic and atraumatic.  Neck: supple with no carotid or supraclavicular bruits Cardiovascular: regular rate and  rhythm, no murmurs Musculoskeletal: no deformity Skin:  no rash/petichiae Vascular:  Normal pulses all extremities Vitals:   09/22/19 1024  BP: 123/76  Pulse: 87  Temp: (!) 97.4 F (36.3 C)   Neurologic Exam Mental Status: Awake and fully alert. Oriented to place and time. Recent and remote memory intact. Attention span, concentration and fund of knowledge appropriate. Mood and affect appropriate.  Diminished recall 1/3.  Able to name 10 animals which can walk on 4 legs.  Clock drawing 4/4 Cranial Nerves: Fundoscopic exam reveals sharp disc margins. Pupils equal, briskly reactive to light. Extraocular movements full without nystagmus. Visual fields full to confrontation. Hearing intact. Facial sensation intact. Face, tongue, palate moves normally and symmetrically.  Motor: Normal bulk and tone. Normal strength in all tested extremity muscles. Sensory.: intact to touch ,pinprick .position and vibratory sensation.  Coordination: Rapid alternating movements normal in all extremities. Finger-to-nose and heel-to-shin performed accurately bilaterally. Gait and Station: Arises from chair without difficulty. Stance is normal. Gait demonstrates normal stride length and balance . Able to heel, toe and tandem walk with mild difficulty.  Reflexes: 1+ and symmetric. Toes downgoing.       ASSESSMENT: 74 year old Caucasian male with transient episode of confusion and disorientation with an abnormal EEG likely representing complex partial seizure.  Negative neuroimaging and nonfocal neurological exam.  He was unable to tolerate Keppra due to itching and rash but is tolerating Depakote better    PLAN: I had a long discussion with the patient and his wife regarding his episode  of transient confusion along with an abnormal EEG likely representing complex partial seizure and need to be on long-term anticonvulsants.  Continue Depakote ER 500mg  daily.  Repeat EEG.  Patient was advised to avoid seizure  provoking factors like medication noncompliance, irregular sleeping and eating habits, stimulants and alcohol and extremes of exertion.  He will return for follow-up in the future in 6 months or call earlier if necessary. Greater than 50% of time during this 25 minute visit was spent on counseling,explanation of diagnosis, planning of further management, discussion with patient and family and coordination of care Antony Contras, MD  [facility] [address] [place], [place] [address]  Phone [phone] Fax [phone] Note: This document was prepared with digital dictation and possible smart phrase technology. Any transcriptional errors that result from this process are unintentional

## 2019-09-28 ENCOUNTER — Other Ambulatory Visit: Payer: Medicare PPO

## 2019-09-29 ENCOUNTER — Other Ambulatory Visit: Payer: Self-pay

## 2019-09-29 ENCOUNTER — Ambulatory Visit: Payer: Medicare PPO

## 2019-09-29 DIAGNOSIS — G40209 Localization-related (focal) (partial) symptomatic epilepsy and epileptic syndromes with complex partial seizures, not intractable, without status epilepticus: Secondary | ICD-10-CM

## 2019-10-11 ENCOUNTER — Telehealth: Payer: Self-pay

## 2019-10-11 NOTE — Telephone Encounter (Signed)
Called pt to go over EEG results. No answer, Phone message states that "This phone number is screening calls and will not accept this call"  "Kindly inform the patient that EEG study was normal"-Dr Leonie Man

## 2019-10-14 ENCOUNTER — Telehealth: Payer: Self-pay

## 2019-10-14 NOTE — Telephone Encounter (Signed)
I tried to call pts phone but it stated pt was not taking any calls. I tried to call pt wife but kept getting disconnected.

## 2019-10-14 NOTE — Telephone Encounter (Signed)
-----   Message from Garvin Fila, MD sent at 10/06/2019  5:05 PM EST ----- Kindly inform the patient that EEG study was normal

## 2019-10-18 NOTE — Telephone Encounter (Signed)
I called pt that EEG was normal.PT verbalized understanding. 

## 2021-04-30 ENCOUNTER — Encounter: Payer: Self-pay | Admitting: Internal Medicine

## 2022-02-11 DIAGNOSIS — I2510 Atherosclerotic heart disease of native coronary artery without angina pectoris: Secondary | ICD-10-CM | POA: Diagnosis not present

## 2022-02-11 DIAGNOSIS — I10 Essential (primary) hypertension: Secondary | ICD-10-CM | POA: Diagnosis not present

## 2022-02-11 DIAGNOSIS — Z955 Presence of coronary angioplasty implant and graft: Secondary | ICD-10-CM | POA: Diagnosis not present

## 2022-02-19 DIAGNOSIS — Z87891 Personal history of nicotine dependence: Secondary | ICD-10-CM | POA: Diagnosis not present

## 2022-02-19 DIAGNOSIS — E119 Type 2 diabetes mellitus without complications: Secondary | ICD-10-CM | POA: Diagnosis not present

## 2022-02-19 DIAGNOSIS — M2578 Osteophyte, vertebrae: Secondary | ICD-10-CM | POA: Diagnosis not present

## 2022-02-19 DIAGNOSIS — Z043 Encounter for examination and observation following other accident: Secondary | ICD-10-CM | POA: Diagnosis not present

## 2022-02-19 DIAGNOSIS — R0989 Other specified symptoms and signs involving the circulatory and respiratory systems: Secondary | ICD-10-CM | POA: Diagnosis not present

## 2022-02-19 DIAGNOSIS — Z7984 Long term (current) use of oral hypoglycemic drugs: Secondary | ICD-10-CM | POA: Diagnosis not present

## 2022-02-19 DIAGNOSIS — R42 Dizziness and giddiness: Secondary | ICD-10-CM | POA: Diagnosis not present

## 2022-02-19 DIAGNOSIS — I517 Cardiomegaly: Secondary | ICD-10-CM | POA: Diagnosis not present

## 2022-02-19 DIAGNOSIS — M545 Low back pain, unspecified: Secondary | ICD-10-CM | POA: Diagnosis not present

## 2022-02-19 DIAGNOSIS — M549 Dorsalgia, unspecified: Secondary | ICD-10-CM | POA: Diagnosis not present

## 2022-02-19 DIAGNOSIS — I69398 Other sequelae of cerebral infarction: Secondary | ICD-10-CM | POA: Diagnosis not present

## 2022-02-19 DIAGNOSIS — M25512 Pain in left shoulder: Secondary | ICD-10-CM | POA: Diagnosis not present

## 2022-02-19 DIAGNOSIS — S8991XA Unspecified injury of right lower leg, initial encounter: Secondary | ICD-10-CM | POA: Diagnosis not present

## 2022-02-19 DIAGNOSIS — M25561 Pain in right knee: Secondary | ICD-10-CM | POA: Diagnosis not present

## 2022-02-19 DIAGNOSIS — Z8551 Personal history of malignant neoplasm of bladder: Secondary | ICD-10-CM | POA: Diagnosis not present

## 2022-02-19 DIAGNOSIS — R531 Weakness: Secondary | ICD-10-CM | POA: Diagnosis not present

## 2022-02-19 DIAGNOSIS — I10 Essential (primary) hypertension: Secondary | ICD-10-CM | POA: Diagnosis not present

## 2022-02-19 DIAGNOSIS — W1830XA Fall on same level, unspecified, initial encounter: Secondary | ICD-10-CM | POA: Diagnosis not present

## 2022-02-19 DIAGNOSIS — Z79899 Other long term (current) drug therapy: Secondary | ICD-10-CM | POA: Diagnosis not present

## 2022-02-19 DIAGNOSIS — S40012A Contusion of left shoulder, initial encounter: Secondary | ICD-10-CM | POA: Diagnosis not present

## 2022-02-19 DIAGNOSIS — S0990XA Unspecified injury of head, initial encounter: Secondary | ICD-10-CM | POA: Diagnosis not present

## 2022-02-19 DIAGNOSIS — R918 Other nonspecific abnormal finding of lung field: Secondary | ICD-10-CM | POA: Diagnosis not present

## 2022-02-19 DIAGNOSIS — M19012 Primary osteoarthritis, left shoulder: Secondary | ICD-10-CM | POA: Diagnosis not present

## 2022-02-19 DIAGNOSIS — M47816 Spondylosis without myelopathy or radiculopathy, lumbar region: Secondary | ICD-10-CM | POA: Diagnosis not present

## 2022-02-20 DIAGNOSIS — G459 Transient cerebral ischemic attack, unspecified: Secondary | ICD-10-CM | POA: Diagnosis not present

## 2022-02-20 DIAGNOSIS — G8190 Hemiplegia, unspecified affecting unspecified side: Secondary | ICD-10-CM | POA: Diagnosis not present

## 2022-02-20 DIAGNOSIS — I10 Essential (primary) hypertension: Secondary | ICD-10-CM | POA: Diagnosis not present

## 2022-02-20 DIAGNOSIS — Z743 Need for continuous supervision: Secondary | ICD-10-CM | POA: Diagnosis not present

## 2022-02-21 DIAGNOSIS — R4182 Altered mental status, unspecified: Secondary | ICD-10-CM | POA: Diagnosis not present

## 2022-02-21 DIAGNOSIS — I69391 Dysphagia following cerebral infarction: Secondary | ICD-10-CM | POA: Diagnosis not present

## 2022-02-21 DIAGNOSIS — Z20822 Contact with and (suspected) exposure to covid-19: Secondary | ICD-10-CM | POA: Diagnosis not present

## 2022-02-21 DIAGNOSIS — S32029D Unspecified fracture of second lumbar vertebra, subsequent encounter for fracture with routine healing: Secondary | ICD-10-CM | POA: Diagnosis not present

## 2022-02-21 DIAGNOSIS — R52 Pain, unspecified: Secondary | ICD-10-CM | POA: Diagnosis not present

## 2022-02-21 DIAGNOSIS — E119 Type 2 diabetes mellitus without complications: Secondary | ICD-10-CM | POA: Diagnosis not present

## 2022-02-21 DIAGNOSIS — W07XXXA Fall from chair, initial encounter: Secondary | ICD-10-CM | POA: Diagnosis not present

## 2022-02-21 DIAGNOSIS — K5730 Diverticulosis of large intestine without perforation or abscess without bleeding: Secondary | ICD-10-CM | POA: Diagnosis not present

## 2022-02-21 DIAGNOSIS — Z7982 Long term (current) use of aspirin: Secondary | ICD-10-CM | POA: Diagnosis not present

## 2022-02-21 DIAGNOSIS — F05 Delirium due to known physiological condition: Secondary | ICD-10-CM | POA: Diagnosis not present

## 2022-02-21 DIAGNOSIS — E1165 Type 2 diabetes mellitus with hyperglycemia: Secondary | ICD-10-CM | POA: Diagnosis not present

## 2022-02-21 DIAGNOSIS — N3000 Acute cystitis without hematuria: Secondary | ICD-10-CM | POA: Diagnosis not present

## 2022-02-21 DIAGNOSIS — W0110XA Fall on same level from slipping, tripping and stumbling with subsequent striking against unspecified object, initial encounter: Secondary | ICD-10-CM | POA: Diagnosis not present

## 2022-02-21 DIAGNOSIS — F1190 Opioid use, unspecified, uncomplicated: Secondary | ICD-10-CM | POA: Diagnosis not present

## 2022-02-21 DIAGNOSIS — E871 Hypo-osmolality and hyponatremia: Secondary | ICD-10-CM | POA: Diagnosis not present

## 2022-02-21 DIAGNOSIS — I2510 Atherosclerotic heart disease of native coronary artery without angina pectoris: Secondary | ICD-10-CM | POA: Diagnosis not present

## 2022-02-21 DIAGNOSIS — S0990XA Unspecified injury of head, initial encounter: Secondary | ICD-10-CM | POA: Diagnosis not present

## 2022-02-21 DIAGNOSIS — S32038D Other fracture of third lumbar vertebra, subsequent encounter for fracture with routine healing: Secondary | ICD-10-CM | POA: Diagnosis not present

## 2022-02-21 DIAGNOSIS — S22009A Unspecified fracture of unspecified thoracic vertebra, initial encounter for closed fracture: Secondary | ICD-10-CM | POA: Diagnosis not present

## 2022-02-21 DIAGNOSIS — R627 Adult failure to thrive: Secondary | ICD-10-CM | POA: Diagnosis not present

## 2022-02-21 DIAGNOSIS — G9340 Encephalopathy, unspecified: Secondary | ICD-10-CM | POA: Diagnosis not present

## 2022-02-21 DIAGNOSIS — N179 Acute kidney failure, unspecified: Secondary | ICD-10-CM | POA: Diagnosis not present

## 2022-02-21 DIAGNOSIS — E860 Dehydration: Secondary | ICD-10-CM | POA: Diagnosis not present

## 2022-02-21 DIAGNOSIS — M6281 Muscle weakness (generalized): Secondary | ICD-10-CM | POA: Diagnosis not present

## 2022-02-21 DIAGNOSIS — S32028D Other fracture of second lumbar vertebra, subsequent encounter for fracture with routine healing: Secondary | ICD-10-CM | POA: Diagnosis not present

## 2022-02-21 DIAGNOSIS — B9562 Methicillin resistant Staphylococcus aureus infection as the cause of diseases classified elsewhere: Secondary | ICD-10-CM | POA: Diagnosis not present

## 2022-02-21 DIAGNOSIS — I10 Essential (primary) hypertension: Secondary | ICD-10-CM | POA: Diagnosis not present

## 2022-02-21 DIAGNOSIS — G40909 Epilepsy, unspecified, not intractable, without status epilepticus: Secondary | ICD-10-CM | POA: Diagnosis not present

## 2022-02-21 DIAGNOSIS — M47812 Spondylosis without myelopathy or radiculopathy, cervical region: Secondary | ICD-10-CM | POA: Diagnosis not present

## 2022-02-21 DIAGNOSIS — M7989 Other specified soft tissue disorders: Secondary | ICD-10-CM | POA: Diagnosis not present

## 2022-02-21 DIAGNOSIS — R488 Other symbolic dysfunctions: Secondary | ICD-10-CM | POA: Diagnosis not present

## 2022-02-21 DIAGNOSIS — M25522 Pain in left elbow: Secondary | ICD-10-CM | POA: Diagnosis not present

## 2022-02-21 DIAGNOSIS — F0390 Unspecified dementia without behavioral disturbance: Secondary | ICD-10-CM | POA: Diagnosis not present

## 2022-02-21 DIAGNOSIS — R410 Disorientation, unspecified: Secondary | ICD-10-CM | POA: Diagnosis not present

## 2022-02-21 DIAGNOSIS — M549 Dorsalgia, unspecified: Secondary | ICD-10-CM | POA: Diagnosis not present

## 2022-02-21 DIAGNOSIS — R296 Repeated falls: Secondary | ICD-10-CM | POA: Diagnosis not present

## 2022-02-21 DIAGNOSIS — M79641 Pain in right hand: Secondary | ICD-10-CM | POA: Diagnosis not present

## 2022-02-21 DIAGNOSIS — R278 Other lack of coordination: Secondary | ICD-10-CM | POA: Diagnosis not present

## 2022-02-21 DIAGNOSIS — R279 Unspecified lack of coordination: Secondary | ICD-10-CM | POA: Diagnosis not present

## 2022-02-21 DIAGNOSIS — N3001 Acute cystitis with hematuria: Secondary | ICD-10-CM | POA: Diagnosis not present

## 2022-02-21 DIAGNOSIS — S32029A Unspecified fracture of second lumbar vertebra, initial encounter for closed fracture: Secondary | ICD-10-CM | POA: Diagnosis not present

## 2022-02-21 DIAGNOSIS — Z743 Need for continuous supervision: Secondary | ICD-10-CM | POA: Diagnosis not present

## 2022-02-21 DIAGNOSIS — R5381 Other malaise: Secondary | ICD-10-CM | POA: Diagnosis not present

## 2022-02-21 DIAGNOSIS — R269 Unspecified abnormalities of gait and mobility: Secondary | ICD-10-CM | POA: Diagnosis not present

## 2022-02-21 DIAGNOSIS — I69354 Hemiplegia and hemiparesis following cerebral infarction affecting left non-dominant side: Secondary | ICD-10-CM | POA: Diagnosis not present

## 2022-02-21 DIAGNOSIS — E876 Hypokalemia: Secondary | ICD-10-CM | POA: Diagnosis not present

## 2022-02-21 DIAGNOSIS — E039 Hypothyroidism, unspecified: Secondary | ICD-10-CM | POA: Diagnosis not present

## 2022-02-21 DIAGNOSIS — I700 Atherosclerosis of aorta: Secondary | ICD-10-CM | POA: Diagnosis not present

## 2022-03-05 DIAGNOSIS — R279 Unspecified lack of coordination: Secondary | ICD-10-CM | POA: Diagnosis not present

## 2022-03-05 DIAGNOSIS — E1165 Type 2 diabetes mellitus with hyperglycemia: Secondary | ICD-10-CM | POA: Diagnosis not present

## 2022-03-05 DIAGNOSIS — R296 Repeated falls: Secondary | ICD-10-CM | POA: Diagnosis not present

## 2022-03-05 DIAGNOSIS — I69354 Hemiplegia and hemiparesis following cerebral infarction affecting left non-dominant side: Secondary | ICD-10-CM | POA: Diagnosis not present

## 2022-03-05 DIAGNOSIS — R278 Other lack of coordination: Secondary | ICD-10-CM | POA: Diagnosis not present

## 2022-03-05 DIAGNOSIS — R52 Pain, unspecified: Secondary | ICD-10-CM | POA: Diagnosis not present

## 2022-03-05 DIAGNOSIS — F064 Anxiety disorder due to known physiological condition: Secondary | ICD-10-CM | POA: Diagnosis not present

## 2022-03-05 DIAGNOSIS — M6281 Muscle weakness (generalized): Secondary | ICD-10-CM | POA: Diagnosis not present

## 2022-03-05 DIAGNOSIS — R5381 Other malaise: Secondary | ICD-10-CM | POA: Diagnosis not present

## 2022-03-05 DIAGNOSIS — S32028D Other fracture of second lumbar vertebra, subsequent encounter for fracture with routine healing: Secondary | ICD-10-CM | POA: Diagnosis not present

## 2022-03-05 DIAGNOSIS — F01B2 Vascular dementia, moderate, with psychotic disturbance: Secondary | ICD-10-CM | POA: Diagnosis not present

## 2022-03-05 DIAGNOSIS — E1140 Type 2 diabetes mellitus with diabetic neuropathy, unspecified: Secondary | ICD-10-CM | POA: Diagnosis not present

## 2022-03-05 DIAGNOSIS — G8929 Other chronic pain: Secondary | ICD-10-CM | POA: Diagnosis not present

## 2022-03-05 DIAGNOSIS — F445 Conversion disorder with seizures or convulsions: Secondary | ICD-10-CM | POA: Diagnosis not present

## 2022-03-05 DIAGNOSIS — I25119 Atherosclerotic heart disease of native coronary artery with unspecified angina pectoris: Secondary | ICD-10-CM | POA: Diagnosis not present

## 2022-03-05 DIAGNOSIS — G40909 Epilepsy, unspecified, not intractable, without status epilepticus: Secondary | ICD-10-CM | POA: Diagnosis not present

## 2022-03-05 DIAGNOSIS — Z743 Need for continuous supervision: Secondary | ICD-10-CM | POA: Diagnosis not present

## 2022-03-05 DIAGNOSIS — F3173 Bipolar disorder, in partial remission, most recent episode manic: Secondary | ICD-10-CM | POA: Diagnosis not present

## 2022-03-05 DIAGNOSIS — F411 Generalized anxiety disorder: Secondary | ICD-10-CM | POA: Diagnosis not present

## 2022-03-05 DIAGNOSIS — N3001 Acute cystitis with hematuria: Secondary | ICD-10-CM | POA: Diagnosis not present

## 2022-03-05 DIAGNOSIS — R488 Other symbolic dysfunctions: Secondary | ICD-10-CM | POA: Diagnosis not present

## 2022-03-05 DIAGNOSIS — I69391 Dysphagia following cerebral infarction: Secondary | ICD-10-CM | POA: Diagnosis not present

## 2022-03-05 DIAGNOSIS — I10 Essential (primary) hypertension: Secondary | ICD-10-CM | POA: Diagnosis not present

## 2022-03-05 DIAGNOSIS — R269 Unspecified abnormalities of gait and mobility: Secondary | ICD-10-CM | POA: Diagnosis not present

## 2022-03-05 DIAGNOSIS — E039 Hypothyroidism, unspecified: Secondary | ICD-10-CM | POA: Diagnosis not present

## 2022-03-12 DIAGNOSIS — I69354 Hemiplegia and hemiparesis following cerebral infarction affecting left non-dominant side: Secondary | ICD-10-CM | POA: Diagnosis not present

## 2022-03-12 DIAGNOSIS — G40909 Epilepsy, unspecified, not intractable, without status epilepticus: Secondary | ICD-10-CM | POA: Diagnosis not present

## 2022-03-12 DIAGNOSIS — F411 Generalized anxiety disorder: Secondary | ICD-10-CM | POA: Diagnosis not present

## 2022-03-12 DIAGNOSIS — E039 Hypothyroidism, unspecified: Secondary | ICD-10-CM | POA: Diagnosis not present

## 2022-03-12 DIAGNOSIS — E1140 Type 2 diabetes mellitus with diabetic neuropathy, unspecified: Secondary | ICD-10-CM | POA: Diagnosis not present

## 2022-03-12 DIAGNOSIS — I10 Essential (primary) hypertension: Secondary | ICD-10-CM | POA: Diagnosis not present

## 2022-03-12 DIAGNOSIS — I25119 Atherosclerotic heart disease of native coronary artery with unspecified angina pectoris: Secondary | ICD-10-CM | POA: Diagnosis not present

## 2022-03-12 DIAGNOSIS — R296 Repeated falls: Secondary | ICD-10-CM | POA: Diagnosis not present

## 2022-03-14 DIAGNOSIS — F3173 Bipolar disorder, in partial remission, most recent episode manic: Secondary | ICD-10-CM | POA: Diagnosis not present

## 2022-03-14 DIAGNOSIS — G8929 Other chronic pain: Secondary | ICD-10-CM | POA: Diagnosis not present

## 2022-03-17 DIAGNOSIS — I25119 Atherosclerotic heart disease of native coronary artery with unspecified angina pectoris: Secondary | ICD-10-CM | POA: Diagnosis not present

## 2022-03-17 DIAGNOSIS — E1140 Type 2 diabetes mellitus with diabetic neuropathy, unspecified: Secondary | ICD-10-CM | POA: Diagnosis not present

## 2022-03-17 DIAGNOSIS — F064 Anxiety disorder due to known physiological condition: Secondary | ICD-10-CM | POA: Diagnosis not present

## 2022-03-17 DIAGNOSIS — F445 Conversion disorder with seizures or convulsions: Secondary | ICD-10-CM | POA: Diagnosis not present

## 2022-03-17 DIAGNOSIS — R296 Repeated falls: Secondary | ICD-10-CM | POA: Diagnosis not present

## 2022-03-17 DIAGNOSIS — I69354 Hemiplegia and hemiparesis following cerebral infarction affecting left non-dominant side: Secondary | ICD-10-CM | POA: Diagnosis not present

## 2022-03-17 DIAGNOSIS — I10 Essential (primary) hypertension: Secondary | ICD-10-CM | POA: Diagnosis not present

## 2022-03-17 DIAGNOSIS — E039 Hypothyroidism, unspecified: Secondary | ICD-10-CM | POA: Diagnosis not present

## 2022-03-19 DIAGNOSIS — E039 Hypothyroidism, unspecified: Secondary | ICD-10-CM | POA: Diagnosis not present

## 2022-03-19 DIAGNOSIS — F064 Anxiety disorder due to known physiological condition: Secondary | ICD-10-CM | POA: Diagnosis not present

## 2022-03-19 DIAGNOSIS — I69354 Hemiplegia and hemiparesis following cerebral infarction affecting left non-dominant side: Secondary | ICD-10-CM | POA: Diagnosis not present

## 2022-03-19 DIAGNOSIS — R296 Repeated falls: Secondary | ICD-10-CM | POA: Diagnosis not present

## 2022-03-19 DIAGNOSIS — G8929 Other chronic pain: Secondary | ICD-10-CM | POA: Diagnosis not present

## 2022-03-19 DIAGNOSIS — I25119 Atherosclerotic heart disease of native coronary artery with unspecified angina pectoris: Secondary | ICD-10-CM | POA: Diagnosis not present

## 2022-03-19 DIAGNOSIS — G40909 Epilepsy, unspecified, not intractable, without status epilepticus: Secondary | ICD-10-CM | POA: Diagnosis not present

## 2022-03-19 DIAGNOSIS — E1140 Type 2 diabetes mellitus with diabetic neuropathy, unspecified: Secondary | ICD-10-CM | POA: Diagnosis not present

## 2022-03-19 DIAGNOSIS — F411 Generalized anxiety disorder: Secondary | ICD-10-CM | POA: Diagnosis not present

## 2022-03-19 DIAGNOSIS — F3173 Bipolar disorder, in partial remission, most recent episode manic: Secondary | ICD-10-CM | POA: Diagnosis not present

## 2022-03-19 DIAGNOSIS — I10 Essential (primary) hypertension: Secondary | ICD-10-CM | POA: Diagnosis not present

## 2022-03-19 DIAGNOSIS — F01B2 Vascular dementia, moderate, with psychotic disturbance: Secondary | ICD-10-CM | POA: Diagnosis not present

## 2022-03-24 DIAGNOSIS — F411 Generalized anxiety disorder: Secondary | ICD-10-CM | POA: Diagnosis not present

## 2022-03-24 DIAGNOSIS — G4700 Insomnia, unspecified: Secondary | ICD-10-CM | POA: Diagnosis not present

## 2022-03-24 DIAGNOSIS — F01B2 Vascular dementia, moderate, with psychotic disturbance: Secondary | ICD-10-CM | POA: Diagnosis not present

## 2022-03-24 DIAGNOSIS — F330 Major depressive disorder, recurrent, mild: Secondary | ICD-10-CM | POA: Diagnosis not present

## 2022-03-24 DIAGNOSIS — G40909 Epilepsy, unspecified, not intractable, without status epilepticus: Secondary | ICD-10-CM | POA: Diagnosis not present

## 2022-03-25 DIAGNOSIS — F321 Major depressive disorder, single episode, moderate: Secondary | ICD-10-CM | POA: Diagnosis not present

## 2022-03-25 DIAGNOSIS — F29 Unspecified psychosis not due to a substance or known physiological condition: Secondary | ICD-10-CM | POA: Diagnosis not present

## 2022-03-26 DIAGNOSIS — R300 Dysuria: Secondary | ICD-10-CM | POA: Diagnosis not present

## 2022-03-26 DIAGNOSIS — N390 Urinary tract infection, site not specified: Secondary | ICD-10-CM | POA: Diagnosis not present

## 2022-04-01 DIAGNOSIS — M6259 Muscle wasting and atrophy, not elsewhere classified, multiple sites: Secondary | ICD-10-CM | POA: Diagnosis not present

## 2022-04-01 DIAGNOSIS — R278 Other lack of coordination: Secondary | ICD-10-CM | POA: Diagnosis not present

## 2022-04-01 DIAGNOSIS — M5459 Other low back pain: Secondary | ICD-10-CM | POA: Diagnosis not present

## 2022-04-01 DIAGNOSIS — M25561 Pain in right knee: Secondary | ICD-10-CM | POA: Diagnosis not present

## 2022-04-01 DIAGNOSIS — M62522 Muscle wasting and atrophy, not elsewhere classified, left upper arm: Secondary | ICD-10-CM | POA: Diagnosis not present

## 2022-04-01 DIAGNOSIS — F02B2 Dementia in other diseases classified elsewhere, moderate, with psychotic disturbance: Secondary | ICD-10-CM | POA: Diagnosis not present

## 2022-04-01 DIAGNOSIS — I69354 Hemiplegia and hemiparesis following cerebral infarction affecting left non-dominant side: Secondary | ICD-10-CM | POA: Diagnosis not present

## 2022-04-01 DIAGNOSIS — M25511 Pain in right shoulder: Secondary | ICD-10-CM | POA: Diagnosis not present

## 2022-04-01 DIAGNOSIS — R41841 Cognitive communication deficit: Secondary | ICD-10-CM | POA: Diagnosis not present

## 2022-04-02 DIAGNOSIS — M5459 Other low back pain: Secondary | ICD-10-CM | POA: Diagnosis not present

## 2022-04-02 DIAGNOSIS — M25511 Pain in right shoulder: Secondary | ICD-10-CM | POA: Diagnosis not present

## 2022-04-02 DIAGNOSIS — M62522 Muscle wasting and atrophy, not elsewhere classified, left upper arm: Secondary | ICD-10-CM | POA: Diagnosis not present

## 2022-04-02 DIAGNOSIS — F02B2 Dementia in other diseases classified elsewhere, moderate, with psychotic disturbance: Secondary | ICD-10-CM | POA: Diagnosis not present

## 2022-04-02 DIAGNOSIS — R41841 Cognitive communication deficit: Secondary | ICD-10-CM | POA: Diagnosis not present

## 2022-04-02 DIAGNOSIS — M6259 Muscle wasting and atrophy, not elsewhere classified, multiple sites: Secondary | ICD-10-CM | POA: Diagnosis not present

## 2022-04-02 DIAGNOSIS — R278 Other lack of coordination: Secondary | ICD-10-CM | POA: Diagnosis not present

## 2022-04-02 DIAGNOSIS — I69354 Hemiplegia and hemiparesis following cerebral infarction affecting left non-dominant side: Secondary | ICD-10-CM | POA: Diagnosis not present

## 2022-04-02 DIAGNOSIS — M25561 Pain in right knee: Secondary | ICD-10-CM | POA: Diagnosis not present

## 2022-04-03 DIAGNOSIS — M5459 Other low back pain: Secondary | ICD-10-CM | POA: Diagnosis not present

## 2022-04-03 DIAGNOSIS — M6259 Muscle wasting and atrophy, not elsewhere classified, multiple sites: Secondary | ICD-10-CM | POA: Diagnosis not present

## 2022-04-03 DIAGNOSIS — M25561 Pain in right knee: Secondary | ICD-10-CM | POA: Diagnosis not present

## 2022-04-03 DIAGNOSIS — I69354 Hemiplegia and hemiparesis following cerebral infarction affecting left non-dominant side: Secondary | ICD-10-CM | POA: Diagnosis not present

## 2022-04-03 DIAGNOSIS — M25511 Pain in right shoulder: Secondary | ICD-10-CM | POA: Diagnosis not present

## 2022-04-03 DIAGNOSIS — M62522 Muscle wasting and atrophy, not elsewhere classified, left upper arm: Secondary | ICD-10-CM | POA: Diagnosis not present

## 2022-04-03 DIAGNOSIS — R278 Other lack of coordination: Secondary | ICD-10-CM | POA: Diagnosis not present

## 2022-04-03 DIAGNOSIS — F02B2 Dementia in other diseases classified elsewhere, moderate, with psychotic disturbance: Secondary | ICD-10-CM | POA: Diagnosis not present

## 2022-04-03 DIAGNOSIS — R41841 Cognitive communication deficit: Secondary | ICD-10-CM | POA: Diagnosis not present

## 2022-04-04 DIAGNOSIS — M25561 Pain in right knee: Secondary | ICD-10-CM | POA: Diagnosis not present

## 2022-04-04 DIAGNOSIS — R41841 Cognitive communication deficit: Secondary | ICD-10-CM | POA: Diagnosis not present

## 2022-04-04 DIAGNOSIS — M6259 Muscle wasting and atrophy, not elsewhere classified, multiple sites: Secondary | ICD-10-CM | POA: Diagnosis not present

## 2022-04-04 DIAGNOSIS — M25511 Pain in right shoulder: Secondary | ICD-10-CM | POA: Diagnosis not present

## 2022-04-04 DIAGNOSIS — F02B2 Dementia in other diseases classified elsewhere, moderate, with psychotic disturbance: Secondary | ICD-10-CM | POA: Diagnosis not present

## 2022-04-04 DIAGNOSIS — R278 Other lack of coordination: Secondary | ICD-10-CM | POA: Diagnosis not present

## 2022-04-04 DIAGNOSIS — I69354 Hemiplegia and hemiparesis following cerebral infarction affecting left non-dominant side: Secondary | ICD-10-CM | POA: Diagnosis not present

## 2022-04-04 DIAGNOSIS — M62522 Muscle wasting and atrophy, not elsewhere classified, left upper arm: Secondary | ICD-10-CM | POA: Diagnosis not present

## 2022-04-04 DIAGNOSIS — M5459 Other low back pain: Secondary | ICD-10-CM | POA: Diagnosis not present

## 2022-04-07 DIAGNOSIS — M5459 Other low back pain: Secondary | ICD-10-CM | POA: Diagnosis not present

## 2022-04-07 DIAGNOSIS — M25511 Pain in right shoulder: Secondary | ICD-10-CM | POA: Diagnosis not present

## 2022-04-07 DIAGNOSIS — R278 Other lack of coordination: Secondary | ICD-10-CM | POA: Diagnosis not present

## 2022-04-07 DIAGNOSIS — M62522 Muscle wasting and atrophy, not elsewhere classified, left upper arm: Secondary | ICD-10-CM | POA: Diagnosis not present

## 2022-04-07 DIAGNOSIS — M25561 Pain in right knee: Secondary | ICD-10-CM | POA: Diagnosis not present

## 2022-04-07 DIAGNOSIS — R41841 Cognitive communication deficit: Secondary | ICD-10-CM | POA: Diagnosis not present

## 2022-04-07 DIAGNOSIS — I69354 Hemiplegia and hemiparesis following cerebral infarction affecting left non-dominant side: Secondary | ICD-10-CM | POA: Diagnosis not present

## 2022-04-07 DIAGNOSIS — F02B2 Dementia in other diseases classified elsewhere, moderate, with psychotic disturbance: Secondary | ICD-10-CM | POA: Diagnosis not present

## 2022-04-07 DIAGNOSIS — M6259 Muscle wasting and atrophy, not elsewhere classified, multiple sites: Secondary | ICD-10-CM | POA: Diagnosis not present

## 2022-04-08 DIAGNOSIS — M5459 Other low back pain: Secondary | ICD-10-CM | POA: Diagnosis not present

## 2022-04-08 DIAGNOSIS — M25561 Pain in right knee: Secondary | ICD-10-CM | POA: Diagnosis not present

## 2022-04-08 DIAGNOSIS — R278 Other lack of coordination: Secondary | ICD-10-CM | POA: Diagnosis not present

## 2022-04-08 DIAGNOSIS — F02B2 Dementia in other diseases classified elsewhere, moderate, with psychotic disturbance: Secondary | ICD-10-CM | POA: Diagnosis not present

## 2022-04-08 DIAGNOSIS — M62522 Muscle wasting and atrophy, not elsewhere classified, left upper arm: Secondary | ICD-10-CM | POA: Diagnosis not present

## 2022-04-08 DIAGNOSIS — M25511 Pain in right shoulder: Secondary | ICD-10-CM | POA: Diagnosis not present

## 2022-04-08 DIAGNOSIS — I69354 Hemiplegia and hemiparesis following cerebral infarction affecting left non-dominant side: Secondary | ICD-10-CM | POA: Diagnosis not present

## 2022-04-08 DIAGNOSIS — R41841 Cognitive communication deficit: Secondary | ICD-10-CM | POA: Diagnosis not present

## 2022-04-08 DIAGNOSIS — M6259 Muscle wasting and atrophy, not elsewhere classified, multiple sites: Secondary | ICD-10-CM | POA: Diagnosis not present

## 2022-04-09 DIAGNOSIS — M5459 Other low back pain: Secondary | ICD-10-CM | POA: Diagnosis not present

## 2022-04-09 DIAGNOSIS — R41841 Cognitive communication deficit: Secondary | ICD-10-CM | POA: Diagnosis not present

## 2022-04-09 DIAGNOSIS — M25511 Pain in right shoulder: Secondary | ICD-10-CM | POA: Diagnosis not present

## 2022-04-09 DIAGNOSIS — M62522 Muscle wasting and atrophy, not elsewhere classified, left upper arm: Secondary | ICD-10-CM | POA: Diagnosis not present

## 2022-04-09 DIAGNOSIS — M6259 Muscle wasting and atrophy, not elsewhere classified, multiple sites: Secondary | ICD-10-CM | POA: Diagnosis not present

## 2022-04-09 DIAGNOSIS — R278 Other lack of coordination: Secondary | ICD-10-CM | POA: Diagnosis not present

## 2022-04-09 DIAGNOSIS — M25561 Pain in right knee: Secondary | ICD-10-CM | POA: Diagnosis not present

## 2022-04-09 DIAGNOSIS — I69354 Hemiplegia and hemiparesis following cerebral infarction affecting left non-dominant side: Secondary | ICD-10-CM | POA: Diagnosis not present

## 2022-04-09 DIAGNOSIS — F02B2 Dementia in other diseases classified elsewhere, moderate, with psychotic disturbance: Secondary | ICD-10-CM | POA: Diagnosis not present

## 2022-04-10 DIAGNOSIS — F02B2 Dementia in other diseases classified elsewhere, moderate, with psychotic disturbance: Secondary | ICD-10-CM | POA: Diagnosis not present

## 2022-04-10 DIAGNOSIS — M5459 Other low back pain: Secondary | ICD-10-CM | POA: Diagnosis not present

## 2022-04-10 DIAGNOSIS — M25561 Pain in right knee: Secondary | ICD-10-CM | POA: Diagnosis not present

## 2022-04-10 DIAGNOSIS — I69354 Hemiplegia and hemiparesis following cerebral infarction affecting left non-dominant side: Secondary | ICD-10-CM | POA: Diagnosis not present

## 2022-04-10 DIAGNOSIS — R41841 Cognitive communication deficit: Secondary | ICD-10-CM | POA: Diagnosis not present

## 2022-04-10 DIAGNOSIS — M62522 Muscle wasting and atrophy, not elsewhere classified, left upper arm: Secondary | ICD-10-CM | POA: Diagnosis not present

## 2022-04-10 DIAGNOSIS — M6259 Muscle wasting and atrophy, not elsewhere classified, multiple sites: Secondary | ICD-10-CM | POA: Diagnosis not present

## 2022-04-10 DIAGNOSIS — M25511 Pain in right shoulder: Secondary | ICD-10-CM | POA: Diagnosis not present

## 2022-04-10 DIAGNOSIS — R278 Other lack of coordination: Secondary | ICD-10-CM | POA: Diagnosis not present

## 2022-04-11 DIAGNOSIS — F02B2 Dementia in other diseases classified elsewhere, moderate, with psychotic disturbance: Secondary | ICD-10-CM | POA: Diagnosis not present

## 2022-04-11 DIAGNOSIS — M5459 Other low back pain: Secondary | ICD-10-CM | POA: Diagnosis not present

## 2022-04-11 DIAGNOSIS — M25511 Pain in right shoulder: Secondary | ICD-10-CM | POA: Diagnosis not present

## 2022-04-11 DIAGNOSIS — R278 Other lack of coordination: Secondary | ICD-10-CM | POA: Diagnosis not present

## 2022-04-11 DIAGNOSIS — I69354 Hemiplegia and hemiparesis following cerebral infarction affecting left non-dominant side: Secondary | ICD-10-CM | POA: Diagnosis not present

## 2022-04-11 DIAGNOSIS — M6259 Muscle wasting and atrophy, not elsewhere classified, multiple sites: Secondary | ICD-10-CM | POA: Diagnosis not present

## 2022-04-11 DIAGNOSIS — R41841 Cognitive communication deficit: Secondary | ICD-10-CM | POA: Diagnosis not present

## 2022-04-11 DIAGNOSIS — M62522 Muscle wasting and atrophy, not elsewhere classified, left upper arm: Secondary | ICD-10-CM | POA: Diagnosis not present

## 2022-04-11 DIAGNOSIS — M25561 Pain in right knee: Secondary | ICD-10-CM | POA: Diagnosis not present

## 2022-04-14 DIAGNOSIS — M62522 Muscle wasting and atrophy, not elsewhere classified, left upper arm: Secondary | ICD-10-CM | POA: Diagnosis not present

## 2022-04-14 DIAGNOSIS — M25561 Pain in right knee: Secondary | ICD-10-CM | POA: Diagnosis not present

## 2022-04-14 DIAGNOSIS — R41841 Cognitive communication deficit: Secondary | ICD-10-CM | POA: Diagnosis not present

## 2022-04-14 DIAGNOSIS — R278 Other lack of coordination: Secondary | ICD-10-CM | POA: Diagnosis not present

## 2022-04-14 DIAGNOSIS — M6259 Muscle wasting and atrophy, not elsewhere classified, multiple sites: Secondary | ICD-10-CM | POA: Diagnosis not present

## 2022-04-14 DIAGNOSIS — M5459 Other low back pain: Secondary | ICD-10-CM | POA: Diagnosis not present

## 2022-04-14 DIAGNOSIS — I69354 Hemiplegia and hemiparesis following cerebral infarction affecting left non-dominant side: Secondary | ICD-10-CM | POA: Diagnosis not present

## 2022-04-14 DIAGNOSIS — F02B2 Dementia in other diseases classified elsewhere, moderate, with psychotic disturbance: Secondary | ICD-10-CM | POA: Diagnosis not present

## 2022-04-14 DIAGNOSIS — M25511 Pain in right shoulder: Secondary | ICD-10-CM | POA: Diagnosis not present

## 2022-04-15 DIAGNOSIS — M62522 Muscle wasting and atrophy, not elsewhere classified, left upper arm: Secondary | ICD-10-CM | POA: Diagnosis not present

## 2022-04-15 DIAGNOSIS — M6259 Muscle wasting and atrophy, not elsewhere classified, multiple sites: Secondary | ICD-10-CM | POA: Diagnosis not present

## 2022-04-15 DIAGNOSIS — I69354 Hemiplegia and hemiparesis following cerebral infarction affecting left non-dominant side: Secondary | ICD-10-CM | POA: Diagnosis not present

## 2022-04-15 DIAGNOSIS — M5459 Other low back pain: Secondary | ICD-10-CM | POA: Diagnosis not present

## 2022-04-15 DIAGNOSIS — F02B2 Dementia in other diseases classified elsewhere, moderate, with psychotic disturbance: Secondary | ICD-10-CM | POA: Diagnosis not present

## 2022-04-15 DIAGNOSIS — R41841 Cognitive communication deficit: Secondary | ICD-10-CM | POA: Diagnosis not present

## 2022-04-15 DIAGNOSIS — R278 Other lack of coordination: Secondary | ICD-10-CM | POA: Diagnosis not present

## 2022-04-15 DIAGNOSIS — M25511 Pain in right shoulder: Secondary | ICD-10-CM | POA: Diagnosis not present

## 2022-04-15 DIAGNOSIS — M25561 Pain in right knee: Secondary | ICD-10-CM | POA: Diagnosis not present

## 2022-04-16 DIAGNOSIS — M62522 Muscle wasting and atrophy, not elsewhere classified, left upper arm: Secondary | ICD-10-CM | POA: Diagnosis not present

## 2022-04-16 DIAGNOSIS — I69354 Hemiplegia and hemiparesis following cerebral infarction affecting left non-dominant side: Secondary | ICD-10-CM | POA: Diagnosis not present

## 2022-04-16 DIAGNOSIS — M25511 Pain in right shoulder: Secondary | ICD-10-CM | POA: Diagnosis not present

## 2022-04-16 DIAGNOSIS — R278 Other lack of coordination: Secondary | ICD-10-CM | POA: Diagnosis not present

## 2022-04-16 DIAGNOSIS — M6259 Muscle wasting and atrophy, not elsewhere classified, multiple sites: Secondary | ICD-10-CM | POA: Diagnosis not present

## 2022-04-16 DIAGNOSIS — R41841 Cognitive communication deficit: Secondary | ICD-10-CM | POA: Diagnosis not present

## 2022-04-16 DIAGNOSIS — F02B2 Dementia in other diseases classified elsewhere, moderate, with psychotic disturbance: Secondary | ICD-10-CM | POA: Diagnosis not present

## 2022-04-16 DIAGNOSIS — M25561 Pain in right knee: Secondary | ICD-10-CM | POA: Diagnosis not present

## 2022-04-16 DIAGNOSIS — M5459 Other low back pain: Secondary | ICD-10-CM | POA: Diagnosis not present

## 2022-04-17 DIAGNOSIS — R41841 Cognitive communication deficit: Secondary | ICD-10-CM | POA: Diagnosis not present

## 2022-04-17 DIAGNOSIS — I69354 Hemiplegia and hemiparesis following cerebral infarction affecting left non-dominant side: Secondary | ICD-10-CM | POA: Diagnosis not present

## 2022-04-17 DIAGNOSIS — M6259 Muscle wasting and atrophy, not elsewhere classified, multiple sites: Secondary | ICD-10-CM | POA: Diagnosis not present

## 2022-04-17 DIAGNOSIS — M5459 Other low back pain: Secondary | ICD-10-CM | POA: Diagnosis not present

## 2022-04-17 DIAGNOSIS — M62522 Muscle wasting and atrophy, not elsewhere classified, left upper arm: Secondary | ICD-10-CM | POA: Diagnosis not present

## 2022-04-17 DIAGNOSIS — M25511 Pain in right shoulder: Secondary | ICD-10-CM | POA: Diagnosis not present

## 2022-04-17 DIAGNOSIS — F02B2 Dementia in other diseases classified elsewhere, moderate, with psychotic disturbance: Secondary | ICD-10-CM | POA: Diagnosis not present

## 2022-04-17 DIAGNOSIS — R278 Other lack of coordination: Secondary | ICD-10-CM | POA: Diagnosis not present

## 2022-04-17 DIAGNOSIS — M25561 Pain in right knee: Secondary | ICD-10-CM | POA: Diagnosis not present

## 2022-04-18 DIAGNOSIS — M25511 Pain in right shoulder: Secondary | ICD-10-CM | POA: Diagnosis not present

## 2022-04-18 DIAGNOSIS — M5459 Other low back pain: Secondary | ICD-10-CM | POA: Diagnosis not present

## 2022-04-18 DIAGNOSIS — I69354 Hemiplegia and hemiparesis following cerebral infarction affecting left non-dominant side: Secondary | ICD-10-CM | POA: Diagnosis not present

## 2022-04-18 DIAGNOSIS — R278 Other lack of coordination: Secondary | ICD-10-CM | POA: Diagnosis not present

## 2022-04-18 DIAGNOSIS — F02B2 Dementia in other diseases classified elsewhere, moderate, with psychotic disturbance: Secondary | ICD-10-CM | POA: Diagnosis not present

## 2022-04-18 DIAGNOSIS — M25561 Pain in right knee: Secondary | ICD-10-CM | POA: Diagnosis not present

## 2022-04-18 DIAGNOSIS — R41841 Cognitive communication deficit: Secondary | ICD-10-CM | POA: Diagnosis not present

## 2022-04-18 DIAGNOSIS — M62522 Muscle wasting and atrophy, not elsewhere classified, left upper arm: Secondary | ICD-10-CM | POA: Diagnosis not present

## 2022-04-18 DIAGNOSIS — M6259 Muscle wasting and atrophy, not elsewhere classified, multiple sites: Secondary | ICD-10-CM | POA: Diagnosis not present

## 2022-04-21 DIAGNOSIS — R278 Other lack of coordination: Secondary | ICD-10-CM | POA: Diagnosis not present

## 2022-04-21 DIAGNOSIS — R41841 Cognitive communication deficit: Secondary | ICD-10-CM | POA: Diagnosis not present

## 2022-04-21 DIAGNOSIS — M25511 Pain in right shoulder: Secondary | ICD-10-CM | POA: Diagnosis not present

## 2022-04-21 DIAGNOSIS — M5459 Other low back pain: Secondary | ICD-10-CM | POA: Diagnosis not present

## 2022-04-21 DIAGNOSIS — I69354 Hemiplegia and hemiparesis following cerebral infarction affecting left non-dominant side: Secondary | ICD-10-CM | POA: Diagnosis not present

## 2022-04-21 DIAGNOSIS — M62522 Muscle wasting and atrophy, not elsewhere classified, left upper arm: Secondary | ICD-10-CM | POA: Diagnosis not present

## 2022-04-21 DIAGNOSIS — F02B2 Dementia in other diseases classified elsewhere, moderate, with psychotic disturbance: Secondary | ICD-10-CM | POA: Diagnosis not present

## 2022-04-21 DIAGNOSIS — M25561 Pain in right knee: Secondary | ICD-10-CM | POA: Diagnosis not present

## 2022-04-21 DIAGNOSIS — M6259 Muscle wasting and atrophy, not elsewhere classified, multiple sites: Secondary | ICD-10-CM | POA: Diagnosis not present

## 2022-04-22 DIAGNOSIS — M62522 Muscle wasting and atrophy, not elsewhere classified, left upper arm: Secondary | ICD-10-CM | POA: Diagnosis not present

## 2022-04-22 DIAGNOSIS — R278 Other lack of coordination: Secondary | ICD-10-CM | POA: Diagnosis not present

## 2022-04-22 DIAGNOSIS — M6259 Muscle wasting and atrophy, not elsewhere classified, multiple sites: Secondary | ICD-10-CM | POA: Diagnosis not present

## 2022-04-22 DIAGNOSIS — M25561 Pain in right knee: Secondary | ICD-10-CM | POA: Diagnosis not present

## 2022-04-22 DIAGNOSIS — F02B2 Dementia in other diseases classified elsewhere, moderate, with psychotic disturbance: Secondary | ICD-10-CM | POA: Diagnosis not present

## 2022-04-22 DIAGNOSIS — M5459 Other low back pain: Secondary | ICD-10-CM | POA: Diagnosis not present

## 2022-04-22 DIAGNOSIS — M25511 Pain in right shoulder: Secondary | ICD-10-CM | POA: Diagnosis not present

## 2022-04-22 DIAGNOSIS — I69354 Hemiplegia and hemiparesis following cerebral infarction affecting left non-dominant side: Secondary | ICD-10-CM | POA: Diagnosis not present

## 2022-04-22 DIAGNOSIS — R41841 Cognitive communication deficit: Secondary | ICD-10-CM | POA: Diagnosis not present

## 2022-04-22 DIAGNOSIS — M546 Pain in thoracic spine: Secondary | ICD-10-CM | POA: Diagnosis not present

## 2022-04-23 DIAGNOSIS — F02B2 Dementia in other diseases classified elsewhere, moderate, with psychotic disturbance: Secondary | ICD-10-CM | POA: Diagnosis not present

## 2022-04-23 DIAGNOSIS — I69354 Hemiplegia and hemiparesis following cerebral infarction affecting left non-dominant side: Secondary | ICD-10-CM | POA: Diagnosis not present

## 2022-04-23 DIAGNOSIS — R41841 Cognitive communication deficit: Secondary | ICD-10-CM | POA: Diagnosis not present

## 2022-04-23 DIAGNOSIS — M6259 Muscle wasting and atrophy, not elsewhere classified, multiple sites: Secondary | ICD-10-CM | POA: Diagnosis not present

## 2022-04-23 DIAGNOSIS — M62522 Muscle wasting and atrophy, not elsewhere classified, left upper arm: Secondary | ICD-10-CM | POA: Diagnosis not present

## 2022-04-23 DIAGNOSIS — M5459 Other low back pain: Secondary | ICD-10-CM | POA: Diagnosis not present

## 2022-04-23 DIAGNOSIS — M25511 Pain in right shoulder: Secondary | ICD-10-CM | POA: Diagnosis not present

## 2022-04-23 DIAGNOSIS — R278 Other lack of coordination: Secondary | ICD-10-CM | POA: Diagnosis not present

## 2022-04-23 DIAGNOSIS — M25561 Pain in right knee: Secondary | ICD-10-CM | POA: Diagnosis not present

## 2022-04-24 DIAGNOSIS — M62522 Muscle wasting and atrophy, not elsewhere classified, left upper arm: Secondary | ICD-10-CM | POA: Diagnosis not present

## 2022-04-24 DIAGNOSIS — F02B2 Dementia in other diseases classified elsewhere, moderate, with psychotic disturbance: Secondary | ICD-10-CM | POA: Diagnosis not present

## 2022-04-24 DIAGNOSIS — M25561 Pain in right knee: Secondary | ICD-10-CM | POA: Diagnosis not present

## 2022-04-24 DIAGNOSIS — M5459 Other low back pain: Secondary | ICD-10-CM | POA: Diagnosis not present

## 2022-04-24 DIAGNOSIS — I69354 Hemiplegia and hemiparesis following cerebral infarction affecting left non-dominant side: Secondary | ICD-10-CM | POA: Diagnosis not present

## 2022-04-24 DIAGNOSIS — M25511 Pain in right shoulder: Secondary | ICD-10-CM | POA: Diagnosis not present

## 2022-04-24 DIAGNOSIS — M6259 Muscle wasting and atrophy, not elsewhere classified, multiple sites: Secondary | ICD-10-CM | POA: Diagnosis not present

## 2022-04-24 DIAGNOSIS — R278 Other lack of coordination: Secondary | ICD-10-CM | POA: Diagnosis not present

## 2022-04-24 DIAGNOSIS — R41841 Cognitive communication deficit: Secondary | ICD-10-CM | POA: Diagnosis not present

## 2022-04-25 DIAGNOSIS — M25511 Pain in right shoulder: Secondary | ICD-10-CM | POA: Diagnosis not present

## 2022-04-25 DIAGNOSIS — R41841 Cognitive communication deficit: Secondary | ICD-10-CM | POA: Diagnosis not present

## 2022-04-25 DIAGNOSIS — M6259 Muscle wasting and atrophy, not elsewhere classified, multiple sites: Secondary | ICD-10-CM | POA: Diagnosis not present

## 2022-04-25 DIAGNOSIS — I69354 Hemiplegia and hemiparesis following cerebral infarction affecting left non-dominant side: Secondary | ICD-10-CM | POA: Diagnosis not present

## 2022-04-25 DIAGNOSIS — M5459 Other low back pain: Secondary | ICD-10-CM | POA: Diagnosis not present

## 2022-04-25 DIAGNOSIS — F02B2 Dementia in other diseases classified elsewhere, moderate, with psychotic disturbance: Secondary | ICD-10-CM | POA: Diagnosis not present

## 2022-04-25 DIAGNOSIS — M62522 Muscle wasting and atrophy, not elsewhere classified, left upper arm: Secondary | ICD-10-CM | POA: Diagnosis not present

## 2022-04-25 DIAGNOSIS — R278 Other lack of coordination: Secondary | ICD-10-CM | POA: Diagnosis not present

## 2022-04-25 DIAGNOSIS — M25561 Pain in right knee: Secondary | ICD-10-CM | POA: Diagnosis not present

## 2022-04-28 DIAGNOSIS — R278 Other lack of coordination: Secondary | ICD-10-CM | POA: Diagnosis not present

## 2022-04-28 DIAGNOSIS — M62522 Muscle wasting and atrophy, not elsewhere classified, left upper arm: Secondary | ICD-10-CM | POA: Diagnosis not present

## 2022-04-28 DIAGNOSIS — M25511 Pain in right shoulder: Secondary | ICD-10-CM | POA: Diagnosis not present

## 2022-04-28 DIAGNOSIS — M6259 Muscle wasting and atrophy, not elsewhere classified, multiple sites: Secondary | ICD-10-CM | POA: Diagnosis not present

## 2022-04-28 DIAGNOSIS — I69354 Hemiplegia and hemiparesis following cerebral infarction affecting left non-dominant side: Secondary | ICD-10-CM | POA: Diagnosis not present

## 2022-04-28 DIAGNOSIS — M5459 Other low back pain: Secondary | ICD-10-CM | POA: Diagnosis not present

## 2022-04-28 DIAGNOSIS — R41841 Cognitive communication deficit: Secondary | ICD-10-CM | POA: Diagnosis not present

## 2022-04-28 DIAGNOSIS — M25561 Pain in right knee: Secondary | ICD-10-CM | POA: Diagnosis not present

## 2022-04-28 DIAGNOSIS — F02B2 Dementia in other diseases classified elsewhere, moderate, with psychotic disturbance: Secondary | ICD-10-CM | POA: Diagnosis not present

## 2022-04-29 DIAGNOSIS — M25561 Pain in right knee: Secondary | ICD-10-CM | POA: Diagnosis not present

## 2022-04-29 DIAGNOSIS — R278 Other lack of coordination: Secondary | ICD-10-CM | POA: Diagnosis not present

## 2022-04-29 DIAGNOSIS — R41841 Cognitive communication deficit: Secondary | ICD-10-CM | POA: Diagnosis not present

## 2022-04-29 DIAGNOSIS — M6259 Muscle wasting and atrophy, not elsewhere classified, multiple sites: Secondary | ICD-10-CM | POA: Diagnosis not present

## 2022-04-29 DIAGNOSIS — M62522 Muscle wasting and atrophy, not elsewhere classified, left upper arm: Secondary | ICD-10-CM | POA: Diagnosis not present

## 2022-04-29 DIAGNOSIS — F02B2 Dementia in other diseases classified elsewhere, moderate, with psychotic disturbance: Secondary | ICD-10-CM | POA: Diagnosis not present

## 2022-04-29 DIAGNOSIS — I69354 Hemiplegia and hemiparesis following cerebral infarction affecting left non-dominant side: Secondary | ICD-10-CM | POA: Diagnosis not present

## 2022-04-29 DIAGNOSIS — M25511 Pain in right shoulder: Secondary | ICD-10-CM | POA: Diagnosis not present

## 2022-04-29 DIAGNOSIS — M5459 Other low back pain: Secondary | ICD-10-CM | POA: Diagnosis not present

## 2022-04-30 DIAGNOSIS — R41841 Cognitive communication deficit: Secondary | ICD-10-CM | POA: Diagnosis not present

## 2022-04-30 DIAGNOSIS — M25561 Pain in right knee: Secondary | ICD-10-CM | POA: Diagnosis not present

## 2022-04-30 DIAGNOSIS — M62522 Muscle wasting and atrophy, not elsewhere classified, left upper arm: Secondary | ICD-10-CM | POA: Diagnosis not present

## 2022-04-30 DIAGNOSIS — M6259 Muscle wasting and atrophy, not elsewhere classified, multiple sites: Secondary | ICD-10-CM | POA: Diagnosis not present

## 2022-04-30 DIAGNOSIS — R278 Other lack of coordination: Secondary | ICD-10-CM | POA: Diagnosis not present

## 2022-04-30 DIAGNOSIS — M25511 Pain in right shoulder: Secondary | ICD-10-CM | POA: Diagnosis not present

## 2022-04-30 DIAGNOSIS — M5459 Other low back pain: Secondary | ICD-10-CM | POA: Diagnosis not present

## 2022-04-30 DIAGNOSIS — F02B2 Dementia in other diseases classified elsewhere, moderate, with psychotic disturbance: Secondary | ICD-10-CM | POA: Diagnosis not present

## 2022-04-30 DIAGNOSIS — I69354 Hemiplegia and hemiparesis following cerebral infarction affecting left non-dominant side: Secondary | ICD-10-CM | POA: Diagnosis not present

## 2022-05-01 DIAGNOSIS — R278 Other lack of coordination: Secondary | ICD-10-CM | POA: Diagnosis not present

## 2022-05-01 DIAGNOSIS — M62522 Muscle wasting and atrophy, not elsewhere classified, left upper arm: Secondary | ICD-10-CM | POA: Diagnosis not present

## 2022-05-01 DIAGNOSIS — I69354 Hemiplegia and hemiparesis following cerebral infarction affecting left non-dominant side: Secondary | ICD-10-CM | POA: Diagnosis not present

## 2022-05-01 DIAGNOSIS — M6259 Muscle wasting and atrophy, not elsewhere classified, multiple sites: Secondary | ICD-10-CM | POA: Diagnosis not present

## 2022-05-01 DIAGNOSIS — F02B2 Dementia in other diseases classified elsewhere, moderate, with psychotic disturbance: Secondary | ICD-10-CM | POA: Diagnosis not present

## 2022-05-01 DIAGNOSIS — M25511 Pain in right shoulder: Secondary | ICD-10-CM | POA: Diagnosis not present

## 2022-05-01 DIAGNOSIS — R41841 Cognitive communication deficit: Secondary | ICD-10-CM | POA: Diagnosis not present

## 2022-05-01 DIAGNOSIS — M5459 Other low back pain: Secondary | ICD-10-CM | POA: Diagnosis not present

## 2022-05-01 DIAGNOSIS — M25561 Pain in right knee: Secondary | ICD-10-CM | POA: Diagnosis not present

## 2022-05-02 DIAGNOSIS — M25511 Pain in right shoulder: Secondary | ICD-10-CM | POA: Diagnosis not present

## 2022-05-02 DIAGNOSIS — R41841 Cognitive communication deficit: Secondary | ICD-10-CM | POA: Diagnosis not present

## 2022-05-02 DIAGNOSIS — M25561 Pain in right knee: Secondary | ICD-10-CM | POA: Diagnosis not present

## 2022-05-02 DIAGNOSIS — M6259 Muscle wasting and atrophy, not elsewhere classified, multiple sites: Secondary | ICD-10-CM | POA: Diagnosis not present

## 2022-05-02 DIAGNOSIS — R278 Other lack of coordination: Secondary | ICD-10-CM | POA: Diagnosis not present

## 2022-05-02 DIAGNOSIS — M62522 Muscle wasting and atrophy, not elsewhere classified, left upper arm: Secondary | ICD-10-CM | POA: Diagnosis not present

## 2022-05-02 DIAGNOSIS — F02B2 Dementia in other diseases classified elsewhere, moderate, with psychotic disturbance: Secondary | ICD-10-CM | POA: Diagnosis not present

## 2022-05-02 DIAGNOSIS — I69354 Hemiplegia and hemiparesis following cerebral infarction affecting left non-dominant side: Secondary | ICD-10-CM | POA: Diagnosis not present

## 2022-05-02 DIAGNOSIS — M5459 Other low back pain: Secondary | ICD-10-CM | POA: Diagnosis not present

## 2022-05-04 DIAGNOSIS — F29 Unspecified psychosis not due to a substance or known physiological condition: Secondary | ICD-10-CM | POA: Diagnosis not present

## 2022-05-04 DIAGNOSIS — F321 Major depressive disorder, single episode, moderate: Secondary | ICD-10-CM | POA: Diagnosis not present

## 2022-05-05 DIAGNOSIS — F02B2 Dementia in other diseases classified elsewhere, moderate, with psychotic disturbance: Secondary | ICD-10-CM | POA: Diagnosis not present

## 2022-05-05 DIAGNOSIS — M62522 Muscle wasting and atrophy, not elsewhere classified, left upper arm: Secondary | ICD-10-CM | POA: Diagnosis not present

## 2022-05-05 DIAGNOSIS — I69354 Hemiplegia and hemiparesis following cerebral infarction affecting left non-dominant side: Secondary | ICD-10-CM | POA: Diagnosis not present

## 2022-05-05 DIAGNOSIS — M6259 Muscle wasting and atrophy, not elsewhere classified, multiple sites: Secondary | ICD-10-CM | POA: Diagnosis not present

## 2022-05-05 DIAGNOSIS — R41841 Cognitive communication deficit: Secondary | ICD-10-CM | POA: Diagnosis not present

## 2022-05-05 DIAGNOSIS — M25511 Pain in right shoulder: Secondary | ICD-10-CM | POA: Diagnosis not present

## 2022-05-05 DIAGNOSIS — M5459 Other low back pain: Secondary | ICD-10-CM | POA: Diagnosis not present

## 2022-05-05 DIAGNOSIS — M25561 Pain in right knee: Secondary | ICD-10-CM | POA: Diagnosis not present

## 2022-05-05 DIAGNOSIS — R278 Other lack of coordination: Secondary | ICD-10-CM | POA: Diagnosis not present

## 2022-05-06 DIAGNOSIS — F02B2 Dementia in other diseases classified elsewhere, moderate, with psychotic disturbance: Secondary | ICD-10-CM | POA: Diagnosis not present

## 2022-05-06 DIAGNOSIS — R41841 Cognitive communication deficit: Secondary | ICD-10-CM | POA: Diagnosis not present

## 2022-05-06 DIAGNOSIS — M5459 Other low back pain: Secondary | ICD-10-CM | POA: Diagnosis not present

## 2022-05-06 DIAGNOSIS — M25511 Pain in right shoulder: Secondary | ICD-10-CM | POA: Diagnosis not present

## 2022-05-06 DIAGNOSIS — M25561 Pain in right knee: Secondary | ICD-10-CM | POA: Diagnosis not present

## 2022-05-06 DIAGNOSIS — M62522 Muscle wasting and atrophy, not elsewhere classified, left upper arm: Secondary | ICD-10-CM | POA: Diagnosis not present

## 2022-05-06 DIAGNOSIS — M6259 Muscle wasting and atrophy, not elsewhere classified, multiple sites: Secondary | ICD-10-CM | POA: Diagnosis not present

## 2022-05-06 DIAGNOSIS — R278 Other lack of coordination: Secondary | ICD-10-CM | POA: Diagnosis not present

## 2022-05-06 DIAGNOSIS — I69354 Hemiplegia and hemiparesis following cerebral infarction affecting left non-dominant side: Secondary | ICD-10-CM | POA: Diagnosis not present

## 2022-05-07 DIAGNOSIS — I10 Essential (primary) hypertension: Secondary | ICD-10-CM | POA: Diagnosis not present

## 2022-05-07 DIAGNOSIS — I69354 Hemiplegia and hemiparesis following cerebral infarction affecting left non-dominant side: Secondary | ICD-10-CM | POA: Diagnosis not present

## 2022-05-07 DIAGNOSIS — F02B2 Dementia in other diseases classified elsewhere, moderate, with psychotic disturbance: Secondary | ICD-10-CM | POA: Diagnosis not present

## 2022-05-07 DIAGNOSIS — M62522 Muscle wasting and atrophy, not elsewhere classified, left upper arm: Secondary | ICD-10-CM | POA: Diagnosis not present

## 2022-05-07 DIAGNOSIS — R2230 Localized swelling, mass and lump, unspecified upper limb: Secondary | ICD-10-CM | POA: Diagnosis not present

## 2022-05-07 DIAGNOSIS — M25511 Pain in right shoulder: Secondary | ICD-10-CM | POA: Diagnosis not present

## 2022-05-07 DIAGNOSIS — R41841 Cognitive communication deficit: Secondary | ICD-10-CM | POA: Diagnosis not present

## 2022-05-07 DIAGNOSIS — R278 Other lack of coordination: Secondary | ICD-10-CM | POA: Diagnosis not present

## 2022-05-07 DIAGNOSIS — M6259 Muscle wasting and atrophy, not elsewhere classified, multiple sites: Secondary | ICD-10-CM | POA: Diagnosis not present

## 2022-05-07 DIAGNOSIS — M25561 Pain in right knee: Secondary | ICD-10-CM | POA: Diagnosis not present

## 2022-05-07 DIAGNOSIS — M5459 Other low back pain: Secondary | ICD-10-CM | POA: Diagnosis not present

## 2022-05-08 DIAGNOSIS — M62522 Muscle wasting and atrophy, not elsewhere classified, left upper arm: Secondary | ICD-10-CM | POA: Diagnosis not present

## 2022-05-08 DIAGNOSIS — M5459 Other low back pain: Secondary | ICD-10-CM | POA: Diagnosis not present

## 2022-05-08 DIAGNOSIS — I69354 Hemiplegia and hemiparesis following cerebral infarction affecting left non-dominant side: Secondary | ICD-10-CM | POA: Diagnosis not present

## 2022-05-08 DIAGNOSIS — M6259 Muscle wasting and atrophy, not elsewhere classified, multiple sites: Secondary | ICD-10-CM | POA: Diagnosis not present

## 2022-05-08 DIAGNOSIS — R41841 Cognitive communication deficit: Secondary | ICD-10-CM | POA: Diagnosis not present

## 2022-05-08 DIAGNOSIS — R278 Other lack of coordination: Secondary | ICD-10-CM | POA: Diagnosis not present

## 2022-05-08 DIAGNOSIS — M25561 Pain in right knee: Secondary | ICD-10-CM | POA: Diagnosis not present

## 2022-05-08 DIAGNOSIS — F02B2 Dementia in other diseases classified elsewhere, moderate, with psychotic disturbance: Secondary | ICD-10-CM | POA: Diagnosis not present

## 2022-05-08 DIAGNOSIS — M25511 Pain in right shoulder: Secondary | ICD-10-CM | POA: Diagnosis not present

## 2022-05-09 DIAGNOSIS — M25561 Pain in right knee: Secondary | ICD-10-CM | POA: Diagnosis not present

## 2022-05-09 DIAGNOSIS — M25511 Pain in right shoulder: Secondary | ICD-10-CM | POA: Diagnosis not present

## 2022-05-09 DIAGNOSIS — M5459 Other low back pain: Secondary | ICD-10-CM | POA: Diagnosis not present

## 2022-05-09 DIAGNOSIS — R41841 Cognitive communication deficit: Secondary | ICD-10-CM | POA: Diagnosis not present

## 2022-05-09 DIAGNOSIS — M6259 Muscle wasting and atrophy, not elsewhere classified, multiple sites: Secondary | ICD-10-CM | POA: Diagnosis not present

## 2022-05-09 DIAGNOSIS — M62522 Muscle wasting and atrophy, not elsewhere classified, left upper arm: Secondary | ICD-10-CM | POA: Diagnosis not present

## 2022-05-09 DIAGNOSIS — I69354 Hemiplegia and hemiparesis following cerebral infarction affecting left non-dominant side: Secondary | ICD-10-CM | POA: Diagnosis not present

## 2022-05-09 DIAGNOSIS — F02B2 Dementia in other diseases classified elsewhere, moderate, with psychotic disturbance: Secondary | ICD-10-CM | POA: Diagnosis not present

## 2022-05-09 DIAGNOSIS — R278 Other lack of coordination: Secondary | ICD-10-CM | POA: Diagnosis not present

## 2022-05-13 DIAGNOSIS — I69354 Hemiplegia and hemiparesis following cerebral infarction affecting left non-dominant side: Secondary | ICD-10-CM | POA: Diagnosis not present

## 2022-05-13 DIAGNOSIS — R41841 Cognitive communication deficit: Secondary | ICD-10-CM | POA: Diagnosis not present

## 2022-05-13 DIAGNOSIS — R278 Other lack of coordination: Secondary | ICD-10-CM | POA: Diagnosis not present

## 2022-05-13 DIAGNOSIS — M25561 Pain in right knee: Secondary | ICD-10-CM | POA: Diagnosis not present

## 2022-05-13 DIAGNOSIS — F02B2 Dementia in other diseases classified elsewhere, moderate, with psychotic disturbance: Secondary | ICD-10-CM | POA: Diagnosis not present

## 2022-05-13 DIAGNOSIS — M62522 Muscle wasting and atrophy, not elsewhere classified, left upper arm: Secondary | ICD-10-CM | POA: Diagnosis not present

## 2022-05-13 DIAGNOSIS — M25511 Pain in right shoulder: Secondary | ICD-10-CM | POA: Diagnosis not present

## 2022-05-13 DIAGNOSIS — M5459 Other low back pain: Secondary | ICD-10-CM | POA: Diagnosis not present

## 2022-05-13 DIAGNOSIS — M6259 Muscle wasting and atrophy, not elsewhere classified, multiple sites: Secondary | ICD-10-CM | POA: Diagnosis not present

## 2022-05-14 DIAGNOSIS — M25511 Pain in right shoulder: Secondary | ICD-10-CM | POA: Diagnosis not present

## 2022-05-14 DIAGNOSIS — M25561 Pain in right knee: Secondary | ICD-10-CM | POA: Diagnosis not present

## 2022-05-14 DIAGNOSIS — M6259 Muscle wasting and atrophy, not elsewhere classified, multiple sites: Secondary | ICD-10-CM | POA: Diagnosis not present

## 2022-05-14 DIAGNOSIS — F02B2 Dementia in other diseases classified elsewhere, moderate, with psychotic disturbance: Secondary | ICD-10-CM | POA: Diagnosis not present

## 2022-05-14 DIAGNOSIS — M5459 Other low back pain: Secondary | ICD-10-CM | POA: Diagnosis not present

## 2022-05-14 DIAGNOSIS — R278 Other lack of coordination: Secondary | ICD-10-CM | POA: Diagnosis not present

## 2022-05-14 DIAGNOSIS — I69354 Hemiplegia and hemiparesis following cerebral infarction affecting left non-dominant side: Secondary | ICD-10-CM | POA: Diagnosis not present

## 2022-05-14 DIAGNOSIS — M62522 Muscle wasting and atrophy, not elsewhere classified, left upper arm: Secondary | ICD-10-CM | POA: Diagnosis not present

## 2022-05-14 DIAGNOSIS — R41841 Cognitive communication deficit: Secondary | ICD-10-CM | POA: Diagnosis not present

## 2022-05-15 DIAGNOSIS — M62522 Muscle wasting and atrophy, not elsewhere classified, left upper arm: Secondary | ICD-10-CM | POA: Diagnosis not present

## 2022-05-15 DIAGNOSIS — I69354 Hemiplegia and hemiparesis following cerebral infarction affecting left non-dominant side: Secondary | ICD-10-CM | POA: Diagnosis not present

## 2022-05-15 DIAGNOSIS — R278 Other lack of coordination: Secondary | ICD-10-CM | POA: Diagnosis not present

## 2022-05-15 DIAGNOSIS — M6259 Muscle wasting and atrophy, not elsewhere classified, multiple sites: Secondary | ICD-10-CM | POA: Diagnosis not present

## 2022-05-15 DIAGNOSIS — M25561 Pain in right knee: Secondary | ICD-10-CM | POA: Diagnosis not present

## 2022-05-15 DIAGNOSIS — F02B2 Dementia in other diseases classified elsewhere, moderate, with psychotic disturbance: Secondary | ICD-10-CM | POA: Diagnosis not present

## 2022-05-15 DIAGNOSIS — R41841 Cognitive communication deficit: Secondary | ICD-10-CM | POA: Diagnosis not present

## 2022-05-15 DIAGNOSIS — M25511 Pain in right shoulder: Secondary | ICD-10-CM | POA: Diagnosis not present

## 2022-05-15 DIAGNOSIS — M5459 Other low back pain: Secondary | ICD-10-CM | POA: Diagnosis not present

## 2022-05-15 DIAGNOSIS — N390 Urinary tract infection, site not specified: Secondary | ICD-10-CM | POA: Diagnosis not present

## 2022-05-16 DIAGNOSIS — I69354 Hemiplegia and hemiparesis following cerebral infarction affecting left non-dominant side: Secondary | ICD-10-CM | POA: Diagnosis not present

## 2022-05-16 DIAGNOSIS — M6259 Muscle wasting and atrophy, not elsewhere classified, multiple sites: Secondary | ICD-10-CM | POA: Diagnosis not present

## 2022-05-16 DIAGNOSIS — M5459 Other low back pain: Secondary | ICD-10-CM | POA: Diagnosis not present

## 2022-05-16 DIAGNOSIS — M25561 Pain in right knee: Secondary | ICD-10-CM | POA: Diagnosis not present

## 2022-05-16 DIAGNOSIS — M62522 Muscle wasting and atrophy, not elsewhere classified, left upper arm: Secondary | ICD-10-CM | POA: Diagnosis not present

## 2022-05-16 DIAGNOSIS — M25511 Pain in right shoulder: Secondary | ICD-10-CM | POA: Diagnosis not present

## 2022-05-16 DIAGNOSIS — R278 Other lack of coordination: Secondary | ICD-10-CM | POA: Diagnosis not present

## 2022-05-16 DIAGNOSIS — R41841 Cognitive communication deficit: Secondary | ICD-10-CM | POA: Diagnosis not present

## 2022-05-16 DIAGNOSIS — F02B2 Dementia in other diseases classified elsewhere, moderate, with psychotic disturbance: Secondary | ICD-10-CM | POA: Diagnosis not present

## 2022-05-19 DIAGNOSIS — M5459 Other low back pain: Secondary | ICD-10-CM | POA: Diagnosis not present

## 2022-05-19 DIAGNOSIS — M6259 Muscle wasting and atrophy, not elsewhere classified, multiple sites: Secondary | ICD-10-CM | POA: Diagnosis not present

## 2022-05-19 DIAGNOSIS — M62522 Muscle wasting and atrophy, not elsewhere classified, left upper arm: Secondary | ICD-10-CM | POA: Diagnosis not present

## 2022-05-19 DIAGNOSIS — F02B2 Dementia in other diseases classified elsewhere, moderate, with psychotic disturbance: Secondary | ICD-10-CM | POA: Diagnosis not present

## 2022-05-19 DIAGNOSIS — M25511 Pain in right shoulder: Secondary | ICD-10-CM | POA: Diagnosis not present

## 2022-05-19 DIAGNOSIS — M25561 Pain in right knee: Secondary | ICD-10-CM | POA: Diagnosis not present

## 2022-05-19 DIAGNOSIS — I69354 Hemiplegia and hemiparesis following cerebral infarction affecting left non-dominant side: Secondary | ICD-10-CM | POA: Diagnosis not present

## 2022-05-19 DIAGNOSIS — R278 Other lack of coordination: Secondary | ICD-10-CM | POA: Diagnosis not present

## 2022-05-19 DIAGNOSIS — R41841 Cognitive communication deficit: Secondary | ICD-10-CM | POA: Diagnosis not present

## 2022-05-20 DIAGNOSIS — M5459 Other low back pain: Secondary | ICD-10-CM | POA: Diagnosis not present

## 2022-05-20 DIAGNOSIS — R41841 Cognitive communication deficit: Secondary | ICD-10-CM | POA: Diagnosis not present

## 2022-05-20 DIAGNOSIS — M6259 Muscle wasting and atrophy, not elsewhere classified, multiple sites: Secondary | ICD-10-CM | POA: Diagnosis not present

## 2022-05-20 DIAGNOSIS — I69354 Hemiplegia and hemiparesis following cerebral infarction affecting left non-dominant side: Secondary | ICD-10-CM | POA: Diagnosis not present

## 2022-05-20 DIAGNOSIS — M25511 Pain in right shoulder: Secondary | ICD-10-CM | POA: Diagnosis not present

## 2022-05-20 DIAGNOSIS — M25561 Pain in right knee: Secondary | ICD-10-CM | POA: Diagnosis not present

## 2022-05-20 DIAGNOSIS — F02B2 Dementia in other diseases classified elsewhere, moderate, with psychotic disturbance: Secondary | ICD-10-CM | POA: Diagnosis not present

## 2022-05-20 DIAGNOSIS — R278 Other lack of coordination: Secondary | ICD-10-CM | POA: Diagnosis not present

## 2022-05-20 DIAGNOSIS — M62522 Muscle wasting and atrophy, not elsewhere classified, left upper arm: Secondary | ICD-10-CM | POA: Diagnosis not present

## 2022-05-21 DIAGNOSIS — M62522 Muscle wasting and atrophy, not elsewhere classified, left upper arm: Secondary | ICD-10-CM | POA: Diagnosis not present

## 2022-05-21 DIAGNOSIS — F02B2 Dementia in other diseases classified elsewhere, moderate, with psychotic disturbance: Secondary | ICD-10-CM | POA: Diagnosis not present

## 2022-05-21 DIAGNOSIS — I10 Essential (primary) hypertension: Secondary | ICD-10-CM | POA: Diagnosis not present

## 2022-05-21 DIAGNOSIS — I69354 Hemiplegia and hemiparesis following cerebral infarction affecting left non-dominant side: Secondary | ICD-10-CM | POA: Diagnosis not present

## 2022-05-21 DIAGNOSIS — E039 Hypothyroidism, unspecified: Secondary | ICD-10-CM | POA: Diagnosis not present

## 2022-05-21 DIAGNOSIS — N401 Enlarged prostate with lower urinary tract symptoms: Secondary | ICD-10-CM | POA: Diagnosis not present

## 2022-05-21 DIAGNOSIS — E559 Vitamin D deficiency, unspecified: Secondary | ICD-10-CM | POA: Diagnosis not present

## 2022-05-21 DIAGNOSIS — E119 Type 2 diabetes mellitus without complications: Secondary | ICD-10-CM | POA: Diagnosis not present

## 2022-05-21 DIAGNOSIS — R278 Other lack of coordination: Secondary | ICD-10-CM | POA: Diagnosis not present

## 2022-05-21 DIAGNOSIS — R41841 Cognitive communication deficit: Secondary | ICD-10-CM | POA: Diagnosis not present

## 2022-05-21 DIAGNOSIS — M25511 Pain in right shoulder: Secondary | ICD-10-CM | POA: Diagnosis not present

## 2022-05-21 DIAGNOSIS — M5459 Other low back pain: Secondary | ICD-10-CM | POA: Diagnosis not present

## 2022-05-21 DIAGNOSIS — M25561 Pain in right knee: Secondary | ICD-10-CM | POA: Diagnosis not present

## 2022-05-21 DIAGNOSIS — M6259 Muscle wasting and atrophy, not elsewhere classified, multiple sites: Secondary | ICD-10-CM | POA: Diagnosis not present

## 2022-05-22 DIAGNOSIS — R41841 Cognitive communication deficit: Secondary | ICD-10-CM | POA: Diagnosis not present

## 2022-05-22 DIAGNOSIS — M6259 Muscle wasting and atrophy, not elsewhere classified, multiple sites: Secondary | ICD-10-CM | POA: Diagnosis not present

## 2022-05-22 DIAGNOSIS — I69354 Hemiplegia and hemiparesis following cerebral infarction affecting left non-dominant side: Secondary | ICD-10-CM | POA: Diagnosis not present

## 2022-05-22 DIAGNOSIS — M25561 Pain in right knee: Secondary | ICD-10-CM | POA: Diagnosis not present

## 2022-05-22 DIAGNOSIS — F02B2 Dementia in other diseases classified elsewhere, moderate, with psychotic disturbance: Secondary | ICD-10-CM | POA: Diagnosis not present

## 2022-05-22 DIAGNOSIS — M62522 Muscle wasting and atrophy, not elsewhere classified, left upper arm: Secondary | ICD-10-CM | POA: Diagnosis not present

## 2022-05-22 DIAGNOSIS — M5459 Other low back pain: Secondary | ICD-10-CM | POA: Diagnosis not present

## 2022-05-22 DIAGNOSIS — M25511 Pain in right shoulder: Secondary | ICD-10-CM | POA: Diagnosis not present

## 2022-05-22 DIAGNOSIS — R278 Other lack of coordination: Secondary | ICD-10-CM | POA: Diagnosis not present

## 2022-05-23 DIAGNOSIS — M25561 Pain in right knee: Secondary | ICD-10-CM | POA: Diagnosis not present

## 2022-05-23 DIAGNOSIS — M62522 Muscle wasting and atrophy, not elsewhere classified, left upper arm: Secondary | ICD-10-CM | POA: Diagnosis not present

## 2022-05-23 DIAGNOSIS — R41841 Cognitive communication deficit: Secondary | ICD-10-CM | POA: Diagnosis not present

## 2022-05-23 DIAGNOSIS — M5459 Other low back pain: Secondary | ICD-10-CM | POA: Diagnosis not present

## 2022-05-23 DIAGNOSIS — M25511 Pain in right shoulder: Secondary | ICD-10-CM | POA: Diagnosis not present

## 2022-05-23 DIAGNOSIS — M6259 Muscle wasting and atrophy, not elsewhere classified, multiple sites: Secondary | ICD-10-CM | POA: Diagnosis not present

## 2022-05-23 DIAGNOSIS — F02B2 Dementia in other diseases classified elsewhere, moderate, with psychotic disturbance: Secondary | ICD-10-CM | POA: Diagnosis not present

## 2022-05-23 DIAGNOSIS — R278 Other lack of coordination: Secondary | ICD-10-CM | POA: Diagnosis not present

## 2022-05-23 DIAGNOSIS — I69354 Hemiplegia and hemiparesis following cerebral infarction affecting left non-dominant side: Secondary | ICD-10-CM | POA: Diagnosis not present

## 2022-05-26 DIAGNOSIS — I69354 Hemiplegia and hemiparesis following cerebral infarction affecting left non-dominant side: Secondary | ICD-10-CM | POA: Diagnosis not present

## 2022-05-26 DIAGNOSIS — R278 Other lack of coordination: Secondary | ICD-10-CM | POA: Diagnosis not present

## 2022-05-26 DIAGNOSIS — M5459 Other low back pain: Secondary | ICD-10-CM | POA: Diagnosis not present

## 2022-05-26 DIAGNOSIS — R41841 Cognitive communication deficit: Secondary | ICD-10-CM | POA: Diagnosis not present

## 2022-05-26 DIAGNOSIS — F02B2 Dementia in other diseases classified elsewhere, moderate, with psychotic disturbance: Secondary | ICD-10-CM | POA: Diagnosis not present

## 2022-05-26 DIAGNOSIS — M25561 Pain in right knee: Secondary | ICD-10-CM | POA: Diagnosis not present

## 2022-05-26 DIAGNOSIS — M25511 Pain in right shoulder: Secondary | ICD-10-CM | POA: Diagnosis not present

## 2022-05-26 DIAGNOSIS — M62522 Muscle wasting and atrophy, not elsewhere classified, left upper arm: Secondary | ICD-10-CM | POA: Diagnosis not present

## 2022-05-26 DIAGNOSIS — M6259 Muscle wasting and atrophy, not elsewhere classified, multiple sites: Secondary | ICD-10-CM | POA: Diagnosis not present

## 2022-05-27 DIAGNOSIS — R41841 Cognitive communication deficit: Secondary | ICD-10-CM | POA: Diagnosis not present

## 2022-05-27 DIAGNOSIS — M5459 Other low back pain: Secondary | ICD-10-CM | POA: Diagnosis not present

## 2022-05-27 DIAGNOSIS — M25561 Pain in right knee: Secondary | ICD-10-CM | POA: Diagnosis not present

## 2022-05-27 DIAGNOSIS — I208 Other forms of angina pectoris: Secondary | ICD-10-CM | POA: Diagnosis not present

## 2022-05-27 DIAGNOSIS — M25511 Pain in right shoulder: Secondary | ICD-10-CM | POA: Diagnosis not present

## 2022-05-27 DIAGNOSIS — M62522 Muscle wasting and atrophy, not elsewhere classified, left upper arm: Secondary | ICD-10-CM | POA: Diagnosis not present

## 2022-05-27 DIAGNOSIS — F321 Major depressive disorder, single episode, moderate: Secondary | ICD-10-CM | POA: Diagnosis not present

## 2022-05-27 DIAGNOSIS — F02B2 Dementia in other diseases classified elsewhere, moderate, with psychotic disturbance: Secondary | ICD-10-CM | POA: Diagnosis not present

## 2022-05-27 DIAGNOSIS — I69354 Hemiplegia and hemiparesis following cerebral infarction affecting left non-dominant side: Secondary | ICD-10-CM | POA: Diagnosis not present

## 2022-05-27 DIAGNOSIS — R278 Other lack of coordination: Secondary | ICD-10-CM | POA: Diagnosis not present

## 2022-05-27 DIAGNOSIS — M6259 Muscle wasting and atrophy, not elsewhere classified, multiple sites: Secondary | ICD-10-CM | POA: Diagnosis not present

## 2022-05-28 DIAGNOSIS — E119 Type 2 diabetes mellitus without complications: Secondary | ICD-10-CM | POA: Diagnosis not present

## 2022-05-28 DIAGNOSIS — E785 Hyperlipidemia, unspecified: Secondary | ICD-10-CM | POA: Diagnosis not present

## 2022-05-28 DIAGNOSIS — F02B2 Dementia in other diseases classified elsewhere, moderate, with psychotic disturbance: Secondary | ICD-10-CM | POA: Diagnosis not present

## 2022-05-28 DIAGNOSIS — I10 Essential (primary) hypertension: Secondary | ICD-10-CM | POA: Diagnosis not present

## 2022-05-28 DIAGNOSIS — M6259 Muscle wasting and atrophy, not elsewhere classified, multiple sites: Secondary | ICD-10-CM | POA: Diagnosis not present

## 2022-05-28 DIAGNOSIS — M25561 Pain in right knee: Secondary | ICD-10-CM | POA: Diagnosis not present

## 2022-05-28 DIAGNOSIS — N401 Enlarged prostate with lower urinary tract symptoms: Secondary | ICD-10-CM | POA: Diagnosis not present

## 2022-05-28 DIAGNOSIS — E559 Vitamin D deficiency, unspecified: Secondary | ICD-10-CM | POA: Diagnosis not present

## 2022-05-28 DIAGNOSIS — M25511 Pain in right shoulder: Secondary | ICD-10-CM | POA: Diagnosis not present

## 2022-05-28 DIAGNOSIS — R278 Other lack of coordination: Secondary | ICD-10-CM | POA: Diagnosis not present

## 2022-05-28 DIAGNOSIS — M5459 Other low back pain: Secondary | ICD-10-CM | POA: Diagnosis not present

## 2022-05-28 DIAGNOSIS — I69354 Hemiplegia and hemiparesis following cerebral infarction affecting left non-dominant side: Secondary | ICD-10-CM | POA: Diagnosis not present

## 2022-05-28 DIAGNOSIS — R41841 Cognitive communication deficit: Secondary | ICD-10-CM | POA: Diagnosis not present

## 2022-05-28 DIAGNOSIS — M62522 Muscle wasting and atrophy, not elsewhere classified, left upper arm: Secondary | ICD-10-CM | POA: Diagnosis not present

## 2022-05-29 DIAGNOSIS — F02B2 Dementia in other diseases classified elsewhere, moderate, with psychotic disturbance: Secondary | ICD-10-CM | POA: Diagnosis not present

## 2022-05-29 DIAGNOSIS — R278 Other lack of coordination: Secondary | ICD-10-CM | POA: Diagnosis not present

## 2022-05-29 DIAGNOSIS — I69354 Hemiplegia and hemiparesis following cerebral infarction affecting left non-dominant side: Secondary | ICD-10-CM | POA: Diagnosis not present

## 2022-05-29 DIAGNOSIS — M5459 Other low back pain: Secondary | ICD-10-CM | POA: Diagnosis not present

## 2022-05-29 DIAGNOSIS — M62522 Muscle wasting and atrophy, not elsewhere classified, left upper arm: Secondary | ICD-10-CM | POA: Diagnosis not present

## 2022-05-29 DIAGNOSIS — M25561 Pain in right knee: Secondary | ICD-10-CM | POA: Diagnosis not present

## 2022-05-29 DIAGNOSIS — M6259 Muscle wasting and atrophy, not elsewhere classified, multiple sites: Secondary | ICD-10-CM | POA: Diagnosis not present

## 2022-05-29 DIAGNOSIS — R41841 Cognitive communication deficit: Secondary | ICD-10-CM | POA: Diagnosis not present

## 2022-05-29 DIAGNOSIS — M25511 Pain in right shoulder: Secondary | ICD-10-CM | POA: Diagnosis not present

## 2022-05-30 DIAGNOSIS — M6259 Muscle wasting and atrophy, not elsewhere classified, multiple sites: Secondary | ICD-10-CM | POA: Diagnosis not present

## 2022-05-30 DIAGNOSIS — M25561 Pain in right knee: Secondary | ICD-10-CM | POA: Diagnosis not present

## 2022-05-30 DIAGNOSIS — I69354 Hemiplegia and hemiparesis following cerebral infarction affecting left non-dominant side: Secondary | ICD-10-CM | POA: Diagnosis not present

## 2022-05-30 DIAGNOSIS — F02B2 Dementia in other diseases classified elsewhere, moderate, with psychotic disturbance: Secondary | ICD-10-CM | POA: Diagnosis not present

## 2022-05-30 DIAGNOSIS — R41841 Cognitive communication deficit: Secondary | ICD-10-CM | POA: Diagnosis not present

## 2022-05-30 DIAGNOSIS — R278 Other lack of coordination: Secondary | ICD-10-CM | POA: Diagnosis not present

## 2022-05-30 DIAGNOSIS — M25511 Pain in right shoulder: Secondary | ICD-10-CM | POA: Diagnosis not present

## 2022-05-30 DIAGNOSIS — M62522 Muscle wasting and atrophy, not elsewhere classified, left upper arm: Secondary | ICD-10-CM | POA: Diagnosis not present

## 2022-05-30 DIAGNOSIS — M5459 Other low back pain: Secondary | ICD-10-CM | POA: Diagnosis not present

## 2022-06-02 DIAGNOSIS — I69354 Hemiplegia and hemiparesis following cerebral infarction affecting left non-dominant side: Secondary | ICD-10-CM | POA: Diagnosis not present

## 2022-06-02 DIAGNOSIS — F02B2 Dementia in other diseases classified elsewhere, moderate, with psychotic disturbance: Secondary | ICD-10-CM | POA: Diagnosis not present

## 2022-06-02 DIAGNOSIS — R41841 Cognitive communication deficit: Secondary | ICD-10-CM | POA: Diagnosis not present

## 2022-06-02 DIAGNOSIS — M25561 Pain in right knee: Secondary | ICD-10-CM | POA: Diagnosis not present

## 2022-06-02 DIAGNOSIS — M6259 Muscle wasting and atrophy, not elsewhere classified, multiple sites: Secondary | ICD-10-CM | POA: Diagnosis not present

## 2022-06-02 DIAGNOSIS — M62522 Muscle wasting and atrophy, not elsewhere classified, left upper arm: Secondary | ICD-10-CM | POA: Diagnosis not present

## 2022-06-02 DIAGNOSIS — M5459 Other low back pain: Secondary | ICD-10-CM | POA: Diagnosis not present

## 2022-06-02 DIAGNOSIS — R278 Other lack of coordination: Secondary | ICD-10-CM | POA: Diagnosis not present

## 2022-06-02 DIAGNOSIS — M25511 Pain in right shoulder: Secondary | ICD-10-CM | POA: Diagnosis not present

## 2022-06-03 DIAGNOSIS — M62522 Muscle wasting and atrophy, not elsewhere classified, left upper arm: Secondary | ICD-10-CM | POA: Diagnosis not present

## 2022-06-03 DIAGNOSIS — M25561 Pain in right knee: Secondary | ICD-10-CM | POA: Diagnosis not present

## 2022-06-03 DIAGNOSIS — I69354 Hemiplegia and hemiparesis following cerebral infarction affecting left non-dominant side: Secondary | ICD-10-CM | POA: Diagnosis not present

## 2022-06-03 DIAGNOSIS — R41841 Cognitive communication deficit: Secondary | ICD-10-CM | POA: Diagnosis not present

## 2022-06-03 DIAGNOSIS — R278 Other lack of coordination: Secondary | ICD-10-CM | POA: Diagnosis not present

## 2022-06-03 DIAGNOSIS — F02B2 Dementia in other diseases classified elsewhere, moderate, with psychotic disturbance: Secondary | ICD-10-CM | POA: Diagnosis not present

## 2022-06-03 DIAGNOSIS — M25511 Pain in right shoulder: Secondary | ICD-10-CM | POA: Diagnosis not present

## 2022-06-03 DIAGNOSIS — M5459 Other low back pain: Secondary | ICD-10-CM | POA: Diagnosis not present

## 2022-06-03 DIAGNOSIS — M6259 Muscle wasting and atrophy, not elsewhere classified, multiple sites: Secondary | ICD-10-CM | POA: Diagnosis not present

## 2022-06-05 DIAGNOSIS — F02B2 Dementia in other diseases classified elsewhere, moderate, with psychotic disturbance: Secondary | ICD-10-CM | POA: Diagnosis not present

## 2022-06-05 DIAGNOSIS — M6259 Muscle wasting and atrophy, not elsewhere classified, multiple sites: Secondary | ICD-10-CM | POA: Diagnosis not present

## 2022-06-05 DIAGNOSIS — I69354 Hemiplegia and hemiparesis following cerebral infarction affecting left non-dominant side: Secondary | ICD-10-CM | POA: Diagnosis not present

## 2022-06-05 DIAGNOSIS — M5459 Other low back pain: Secondary | ICD-10-CM | POA: Diagnosis not present

## 2022-06-05 DIAGNOSIS — R41841 Cognitive communication deficit: Secondary | ICD-10-CM | POA: Diagnosis not present

## 2022-06-05 DIAGNOSIS — M62522 Muscle wasting and atrophy, not elsewhere classified, left upper arm: Secondary | ICD-10-CM | POA: Diagnosis not present

## 2022-06-05 DIAGNOSIS — M25511 Pain in right shoulder: Secondary | ICD-10-CM | POA: Diagnosis not present

## 2022-06-05 DIAGNOSIS — R278 Other lack of coordination: Secondary | ICD-10-CM | POA: Diagnosis not present

## 2022-06-05 DIAGNOSIS — M25561 Pain in right knee: Secondary | ICD-10-CM | POA: Diagnosis not present

## 2022-06-06 DIAGNOSIS — I69354 Hemiplegia and hemiparesis following cerebral infarction affecting left non-dominant side: Secondary | ICD-10-CM | POA: Diagnosis not present

## 2022-06-06 DIAGNOSIS — R41841 Cognitive communication deficit: Secondary | ICD-10-CM | POA: Diagnosis not present

## 2022-06-06 DIAGNOSIS — R278 Other lack of coordination: Secondary | ICD-10-CM | POA: Diagnosis not present

## 2022-06-06 DIAGNOSIS — F02B2 Dementia in other diseases classified elsewhere, moderate, with psychotic disturbance: Secondary | ICD-10-CM | POA: Diagnosis not present

## 2022-06-06 DIAGNOSIS — M25511 Pain in right shoulder: Secondary | ICD-10-CM | POA: Diagnosis not present

## 2022-06-06 DIAGNOSIS — M5459 Other low back pain: Secondary | ICD-10-CM | POA: Diagnosis not present

## 2022-06-06 DIAGNOSIS — M6259 Muscle wasting and atrophy, not elsewhere classified, multiple sites: Secondary | ICD-10-CM | POA: Diagnosis not present

## 2022-06-06 DIAGNOSIS — M62522 Muscle wasting and atrophy, not elsewhere classified, left upper arm: Secondary | ICD-10-CM | POA: Diagnosis not present

## 2022-06-06 DIAGNOSIS — M25561 Pain in right knee: Secondary | ICD-10-CM | POA: Diagnosis not present

## 2022-06-09 DIAGNOSIS — M62522 Muscle wasting and atrophy, not elsewhere classified, left upper arm: Secondary | ICD-10-CM | POA: Diagnosis not present

## 2022-06-09 DIAGNOSIS — I69354 Hemiplegia and hemiparesis following cerebral infarction affecting left non-dominant side: Secondary | ICD-10-CM | POA: Diagnosis not present

## 2022-06-09 DIAGNOSIS — R4189 Other symptoms and signs involving cognitive functions and awareness: Secondary | ICD-10-CM | POA: Diagnosis not present

## 2022-06-09 DIAGNOSIS — R278 Other lack of coordination: Secondary | ICD-10-CM | POA: Diagnosis not present

## 2022-06-09 DIAGNOSIS — G4089 Other seizures: Secondary | ICD-10-CM | POA: Diagnosis not present

## 2022-06-09 DIAGNOSIS — F02B2 Dementia in other diseases classified elsewhere, moderate, with psychotic disturbance: Secondary | ICD-10-CM | POA: Diagnosis not present

## 2022-06-09 DIAGNOSIS — M25511 Pain in right shoulder: Secondary | ICD-10-CM | POA: Diagnosis not present

## 2022-06-09 DIAGNOSIS — R41841 Cognitive communication deficit: Secondary | ICD-10-CM | POA: Diagnosis not present

## 2022-06-09 DIAGNOSIS — M62521 Muscle wasting and atrophy, not elsewhere classified, right upper arm: Secondary | ICD-10-CM | POA: Diagnosis not present

## 2022-06-11 DIAGNOSIS — R4189 Other symptoms and signs involving cognitive functions and awareness: Secondary | ICD-10-CM | POA: Diagnosis not present

## 2022-06-11 DIAGNOSIS — R278 Other lack of coordination: Secondary | ICD-10-CM | POA: Diagnosis not present

## 2022-06-11 DIAGNOSIS — E559 Vitamin D deficiency, unspecified: Secondary | ICD-10-CM | POA: Diagnosis not present

## 2022-06-11 DIAGNOSIS — I69354 Hemiplegia and hemiparesis following cerebral infarction affecting left non-dominant side: Secondary | ICD-10-CM | POA: Diagnosis not present

## 2022-06-11 DIAGNOSIS — M25511 Pain in right shoulder: Secondary | ICD-10-CM | POA: Diagnosis not present

## 2022-06-11 DIAGNOSIS — G4089 Other seizures: Secondary | ICD-10-CM | POA: Diagnosis not present

## 2022-06-11 DIAGNOSIS — R41841 Cognitive communication deficit: Secondary | ICD-10-CM | POA: Diagnosis not present

## 2022-06-11 DIAGNOSIS — M62521 Muscle wasting and atrophy, not elsewhere classified, right upper arm: Secondary | ICD-10-CM | POA: Diagnosis not present

## 2022-06-11 DIAGNOSIS — M62522 Muscle wasting and atrophy, not elsewhere classified, left upper arm: Secondary | ICD-10-CM | POA: Diagnosis not present

## 2022-06-11 DIAGNOSIS — F02B2 Dementia in other diseases classified elsewhere, moderate, with psychotic disturbance: Secondary | ICD-10-CM | POA: Diagnosis not present

## 2022-06-12 DIAGNOSIS — M62521 Muscle wasting and atrophy, not elsewhere classified, right upper arm: Secondary | ICD-10-CM | POA: Diagnosis not present

## 2022-06-12 DIAGNOSIS — M62522 Muscle wasting and atrophy, not elsewhere classified, left upper arm: Secondary | ICD-10-CM | POA: Diagnosis not present

## 2022-06-12 DIAGNOSIS — R41841 Cognitive communication deficit: Secondary | ICD-10-CM | POA: Diagnosis not present

## 2022-06-12 DIAGNOSIS — I69354 Hemiplegia and hemiparesis following cerebral infarction affecting left non-dominant side: Secondary | ICD-10-CM | POA: Diagnosis not present

## 2022-06-12 DIAGNOSIS — R278 Other lack of coordination: Secondary | ICD-10-CM | POA: Diagnosis not present

## 2022-06-12 DIAGNOSIS — R4189 Other symptoms and signs involving cognitive functions and awareness: Secondary | ICD-10-CM | POA: Diagnosis not present

## 2022-06-12 DIAGNOSIS — M25511 Pain in right shoulder: Secondary | ICD-10-CM | POA: Diagnosis not present

## 2022-06-12 DIAGNOSIS — G4089 Other seizures: Secondary | ICD-10-CM | POA: Diagnosis not present

## 2022-06-12 DIAGNOSIS — F02B2 Dementia in other diseases classified elsewhere, moderate, with psychotic disturbance: Secondary | ICD-10-CM | POA: Diagnosis not present

## 2022-06-13 DIAGNOSIS — I69354 Hemiplegia and hemiparesis following cerebral infarction affecting left non-dominant side: Secondary | ICD-10-CM | POA: Diagnosis not present

## 2022-06-13 DIAGNOSIS — R4189 Other symptoms and signs involving cognitive functions and awareness: Secondary | ICD-10-CM | POA: Diagnosis not present

## 2022-06-13 DIAGNOSIS — M25511 Pain in right shoulder: Secondary | ICD-10-CM | POA: Diagnosis not present

## 2022-06-13 DIAGNOSIS — R278 Other lack of coordination: Secondary | ICD-10-CM | POA: Diagnosis not present

## 2022-06-13 DIAGNOSIS — R41841 Cognitive communication deficit: Secondary | ICD-10-CM | POA: Diagnosis not present

## 2022-06-13 DIAGNOSIS — M62521 Muscle wasting and atrophy, not elsewhere classified, right upper arm: Secondary | ICD-10-CM | POA: Diagnosis not present

## 2022-06-13 DIAGNOSIS — G4089 Other seizures: Secondary | ICD-10-CM | POA: Diagnosis not present

## 2022-06-13 DIAGNOSIS — M62522 Muscle wasting and atrophy, not elsewhere classified, left upper arm: Secondary | ICD-10-CM | POA: Diagnosis not present

## 2022-06-13 DIAGNOSIS — F02B2 Dementia in other diseases classified elsewhere, moderate, with psychotic disturbance: Secondary | ICD-10-CM | POA: Diagnosis not present

## 2022-06-17 DIAGNOSIS — Z0001 Encounter for general adult medical examination with abnormal findings: Secondary | ICD-10-CM | POA: Diagnosis not present

## 2022-06-22 DIAGNOSIS — F321 Major depressive disorder, single episode, moderate: Secondary | ICD-10-CM | POA: Diagnosis not present

## 2022-06-22 DIAGNOSIS — F29 Unspecified psychosis not due to a substance or known physiological condition: Secondary | ICD-10-CM | POA: Diagnosis not present

## 2022-07-09 DIAGNOSIS — E559 Vitamin D deficiency, unspecified: Secondary | ICD-10-CM | POA: Diagnosis not present

## 2022-07-14 DIAGNOSIS — E079 Disorder of thyroid, unspecified: Secondary | ICD-10-CM | POA: Diagnosis not present

## 2022-07-14 DIAGNOSIS — I10 Essential (primary) hypertension: Secondary | ICD-10-CM | POA: Diagnosis not present

## 2022-07-14 DIAGNOSIS — W19XXXA Unspecified fall, initial encounter: Secondary | ICD-10-CM | POA: Diagnosis not present

## 2022-07-14 DIAGNOSIS — Z87891 Personal history of nicotine dependence: Secondary | ICD-10-CM | POA: Diagnosis not present

## 2022-07-14 DIAGNOSIS — S0083XA Contusion of other part of head, initial encounter: Secondary | ICD-10-CM | POA: Diagnosis not present

## 2022-07-14 DIAGNOSIS — Z79899 Other long term (current) drug therapy: Secondary | ICD-10-CM | POA: Diagnosis not present

## 2022-07-14 DIAGNOSIS — Z743 Need for continuous supervision: Secondary | ICD-10-CM | POA: Diagnosis not present

## 2022-07-14 DIAGNOSIS — R296 Repeated falls: Secondary | ICD-10-CM | POA: Diagnosis not present

## 2022-07-14 DIAGNOSIS — E785 Hyperlipidemia, unspecified: Secondary | ICD-10-CM | POA: Diagnosis not present

## 2022-07-14 DIAGNOSIS — Z882 Allergy status to sulfonamides status: Secondary | ICD-10-CM | POA: Diagnosis not present

## 2022-07-14 DIAGNOSIS — Z8551 Personal history of malignant neoplasm of bladder: Secondary | ICD-10-CM | POA: Diagnosis not present

## 2022-07-14 DIAGNOSIS — E119 Type 2 diabetes mellitus without complications: Secondary | ICD-10-CM | POA: Diagnosis not present

## 2022-07-16 DIAGNOSIS — I2510 Atherosclerotic heart disease of native coronary artery without angina pectoris: Secondary | ICD-10-CM | POA: Diagnosis not present

## 2022-07-16 DIAGNOSIS — M549 Dorsalgia, unspecified: Secondary | ICD-10-CM | POA: Diagnosis not present

## 2022-07-16 DIAGNOSIS — G894 Chronic pain syndrome: Secondary | ICD-10-CM | POA: Diagnosis not present

## 2022-08-06 DIAGNOSIS — F321 Major depressive disorder, single episode, moderate: Secondary | ICD-10-CM | POA: Diagnosis not present

## 2022-08-06 DIAGNOSIS — I209 Angina pectoris, unspecified: Secondary | ICD-10-CM | POA: Diagnosis not present

## 2022-08-10 DIAGNOSIS — N390 Urinary tract infection, site not specified: Secondary | ICD-10-CM | POA: Diagnosis not present

## 2022-08-12 DIAGNOSIS — R4701 Aphasia: Secondary | ICD-10-CM | POA: Diagnosis not present

## 2022-08-12 DIAGNOSIS — R569 Unspecified convulsions: Secondary | ICD-10-CM | POA: Diagnosis not present

## 2022-08-12 DIAGNOSIS — R4182 Altered mental status, unspecified: Secondary | ICD-10-CM | POA: Diagnosis not present

## 2022-08-12 DIAGNOSIS — F03C4 Unspecified dementia, severe, with anxiety: Secondary | ICD-10-CM | POA: Diagnosis not present

## 2022-08-12 DIAGNOSIS — K5289 Other specified noninfective gastroenteritis and colitis: Secondary | ICD-10-CM | POA: Diagnosis not present

## 2022-08-12 DIAGNOSIS — Z20822 Contact with and (suspected) exposure to covid-19: Secondary | ICD-10-CM | POA: Diagnosis not present

## 2022-08-12 DIAGNOSIS — E278 Other specified disorders of adrenal gland: Secondary | ICD-10-CM | POA: Diagnosis not present

## 2022-08-12 DIAGNOSIS — K6289 Other specified diseases of anus and rectum: Secondary | ICD-10-CM | POA: Diagnosis not present

## 2022-08-12 DIAGNOSIS — M549 Dorsalgia, unspecified: Secondary | ICD-10-CM | POA: Diagnosis not present

## 2022-08-12 DIAGNOSIS — K219 Gastro-esophageal reflux disease without esophagitis: Secondary | ICD-10-CM | POA: Diagnosis not present

## 2022-08-12 DIAGNOSIS — F29 Unspecified psychosis not due to a substance or known physiological condition: Secondary | ICD-10-CM | POA: Diagnosis not present

## 2022-08-12 DIAGNOSIS — G459 Transient cerebral ischemic attack, unspecified: Secondary | ICD-10-CM | POA: Diagnosis not present

## 2022-08-12 DIAGNOSIS — I700 Atherosclerosis of aorta: Secondary | ICD-10-CM | POA: Diagnosis not present

## 2022-08-12 DIAGNOSIS — R9082 White matter disease, unspecified: Secondary | ICD-10-CM | POA: Diagnosis not present

## 2022-08-12 DIAGNOSIS — F03C18 Unspecified dementia, severe, with other behavioral disturbance: Secondary | ICD-10-CM | POA: Diagnosis not present

## 2022-08-12 DIAGNOSIS — I10 Essential (primary) hypertension: Secondary | ICD-10-CM | POA: Diagnosis not present

## 2022-08-12 DIAGNOSIS — R531 Weakness: Secondary | ICD-10-CM | POA: Diagnosis not present

## 2022-08-12 DIAGNOSIS — G4733 Obstructive sleep apnea (adult) (pediatric): Secondary | ICD-10-CM | POA: Diagnosis not present

## 2022-08-12 DIAGNOSIS — G9340 Encephalopathy, unspecified: Secondary | ICD-10-CM | POA: Diagnosis not present

## 2022-08-12 DIAGNOSIS — K5900 Constipation, unspecified: Secondary | ICD-10-CM | POA: Diagnosis not present

## 2022-08-12 DIAGNOSIS — Z515 Encounter for palliative care: Secondary | ICD-10-CM | POA: Diagnosis not present

## 2022-08-12 DIAGNOSIS — G8104 Flaccid hemiplegia affecting left nondominant side: Secondary | ICD-10-CM | POA: Diagnosis not present

## 2022-08-12 DIAGNOSIS — R918 Other nonspecific abnormal finding of lung field: Secondary | ICD-10-CM | POA: Diagnosis not present

## 2022-08-12 DIAGNOSIS — E039 Hypothyroidism, unspecified: Secondary | ICD-10-CM | POA: Diagnosis not present

## 2022-08-12 DIAGNOSIS — R509 Fever, unspecified: Secondary | ICD-10-CM | POA: Diagnosis not present

## 2022-08-12 DIAGNOSIS — R0602 Shortness of breath: Secondary | ICD-10-CM | POA: Diagnosis not present

## 2022-08-12 DIAGNOSIS — I517 Cardiomegaly: Secondary | ICD-10-CM | POA: Diagnosis not present

## 2022-08-12 DIAGNOSIS — R059 Cough, unspecified: Secondary | ICD-10-CM | POA: Diagnosis not present

## 2022-08-12 DIAGNOSIS — K633 Ulcer of intestine: Secondary | ICD-10-CM | POA: Diagnosis not present

## 2022-08-12 DIAGNOSIS — I2510 Atherosclerotic heart disease of native coronary artery without angina pectoris: Secondary | ICD-10-CM | POA: Diagnosis not present

## 2022-08-12 DIAGNOSIS — Z87891 Personal history of nicotine dependence: Secondary | ICD-10-CM | POA: Diagnosis not present

## 2022-08-12 DIAGNOSIS — E782 Mixed hyperlipidemia: Secondary | ICD-10-CM | POA: Diagnosis not present

## 2022-08-12 DIAGNOSIS — E1165 Type 2 diabetes mellitus with hyperglycemia: Secondary | ICD-10-CM | POA: Diagnosis not present

## 2022-08-12 DIAGNOSIS — I6782 Cerebral ischemia: Secondary | ICD-10-CM | POA: Diagnosis not present

## 2022-08-12 DIAGNOSIS — R0902 Hypoxemia: Secondary | ICD-10-CM | POA: Diagnosis not present

## 2022-08-12 DIAGNOSIS — E876 Hypokalemia: Secondary | ICD-10-CM | POA: Diagnosis not present

## 2022-08-12 DIAGNOSIS — Z7409 Other reduced mobility: Secondary | ICD-10-CM | POA: Diagnosis not present

## 2022-08-12 DIAGNOSIS — Z789 Other specified health status: Secondary | ICD-10-CM | POA: Diagnosis not present

## 2022-08-12 DIAGNOSIS — G894 Chronic pain syndrome: Secondary | ICD-10-CM | POA: Diagnosis not present

## 2022-08-22 DIAGNOSIS — E559 Vitamin D deficiency, unspecified: Secondary | ICD-10-CM | POA: Diagnosis not present

## 2022-08-22 DIAGNOSIS — E119 Type 2 diabetes mellitus without complications: Secondary | ICD-10-CM | POA: Diagnosis not present

## 2022-08-22 DIAGNOSIS — Z79899 Other long term (current) drug therapy: Secondary | ICD-10-CM | POA: Diagnosis not present

## 2022-08-26 ENCOUNTER — Non-Acute Institutional Stay: Payer: Self-pay | Admitting: Family Medicine

## 2022-08-26 ENCOUNTER — Encounter: Payer: Self-pay | Admitting: Family Medicine

## 2022-08-26 VITALS — BP 130/74 | HR 51 | Temp 98.1°F | Resp 16 | Ht 71.0 in | Wt 159.8 lb

## 2022-08-26 DIAGNOSIS — F01C0 Vascular dementia, severe, without behavioral disturbance, psychotic disturbance, mood disturbance, and anxiety: Secondary | ICD-10-CM

## 2022-08-26 DIAGNOSIS — R634 Abnormal weight loss: Secondary | ICD-10-CM

## 2022-08-26 DIAGNOSIS — R1310 Dysphagia, unspecified: Secondary | ICD-10-CM

## 2022-08-26 DIAGNOSIS — K5900 Constipation, unspecified: Secondary | ICD-10-CM

## 2022-08-26 DIAGNOSIS — K633 Ulcer of intestine: Secondary | ICD-10-CM

## 2022-08-26 DIAGNOSIS — Z7189 Other specified counseling: Secondary | ICD-10-CM

## 2022-08-26 NOTE — Progress Notes (Unsigned)
[facility] Palliative Care Consult Note Telephone: [phone]  Fax: [phone]   Date of encounter: 08/26/22 11:41 AM PATIENT NAME: Dan [name] [place] [address]   [phone] (home)  DOB: [birth year] MRN: [id number] PRIMARY CARE PROVIDER:    Azell Der, MD,  [address] [place] [address] [phone]  REFERRING PROVIDER:   Azell Der, MD [address] [place],  [place] [address] [phone]  RESPONSIBLE PARTY:    Contact Information     Name Relation Home Work Mobile   [place] Spouse [phone]        I met face to face with patient in [facility]. Palliative Care was asked to follow this patient by consultation request of  Dsa, Mayford Knife, MD to address advance care planning and complex medical decision making. Spoke with pt's daughter's Oretha Milch and Darlyne Russian by phone conference call.  This is the initial visit.          ASSESSMENT, SYMPTOM MANAGEMENT AND PLAN / RECOMMENDATIONS:     Follow up Palliative Care Visit: Palliative care will continue to follow for complex medical decision making, advance care planning, and clarification of goals. Return 2-3 weeks or prn.    This visit was coded based on medical decision making (MDM).  PPS: 30%  HOSPICE ELIGIBILITY/DIAGNOSIS: TBD  Chief Complaint:   [facility] received a referral to follow up with patient for chronic disease management in setting of dementia.  Palliative Care is also following for advance directive planning and defining/refining goals of care.    HISTORY OF PRESENT ILLNESS:  Dan [name] is a 76 y.o. year old male  with likely vascular dementia, Type 2 DM with diabetic neuropathy, degenerative disc disease, hx of stroke with left sided hemiparesis, GERD, BPH, HLD, obesity, and hx of C diff infection.  Recently pt was noted at his ALF to have  increasing difficulty with speech and increased confusion so he went to [facility] on 08/12/22 where he was admitted.  Pt has old hx of stroke and approximately 5 years ago was noted to have a possible seizure although nothing was definitive on scans he was started on Depakote by his Neurologist Dr Leonie Man. He was also noted to have increasing weakness of his already weaker non-dominant left side and was observed by facility staff with some seizure like activity. Working diagnosis was that acute encephalopathy might be post-ictal.  He was on Depakote 500 mg daily.  He was noted to have stercoral colitis on CT abd/pelvis and difficulty swallowing.  He does have a prior hx of SDH. With the witnessed episode there was not accompanied by incontinence of urine or stool. On arrival he was noted to be hypertensive with BP 200/90.     History obtained from review of EMR, discussion with primary team, and interview with family, facility staff/caregiver and/or Mr. Cromley.   08/17/22 UA:  Ref Range & Units 9 d ago  Urine Color Yellow Yellow  Urine Appearance Clear Clear  Urine Specific Gravity 1.005 - 1.030 1.010  Urine pH 5 to 9 6.5  Urine Protein - Dipstick Negative mg/dl Negative  Urine Glucose Negative mg/dL 500 Abnormal   Urine Ketones Negative mg/dl 10 Abnormal   Urine Bilirubin Negative mg/dL Negative  Urine Blood Negative mg/dL Negative  Urine Nitrite Negative Negative  Urine Urobilinogen <2 mg/dl <2  Urine Leukocyte Esterase Negative Leu/mcL Negative  UA Microscopic No Micro  No Micro  [place]  Specimen Collected: 08/17/22 02:59   Performed by: [facility] Last Resulted: 08/17/22 03:   Component 08/14/22 08/13/22 08/12/22 07/14/22 02/19/22 09/26/21  Na 140 140 141 143 137 140  Potassium 4.1 3.2 Low  3.6 Low  3.8 3.8 3.9  Cl 107 101 100 102 98 104  CO2 _0 AGAP _1 Glucose 130 High  134 High   126 High  144 High  109 High   124 High    BUN _2 Creatinine 0.50 Low  0.80  0.90 0.71 Low  1.46 High  0.54 Low    Ca 9.2 9.2  9.5 9.4 10.0 8.8   ALK PHOS 107 -- 131 138 106 --  T Bili 0.43 -- 0.41 0.40 0.81 --  Total Protein 6.3 -- 7.1 7.1 7.4 --  Alb 3.5 -- 4.4 4.1 4.3 --  GLOBULIN 2.8 -- 2.7 3.0 3.1 --  ALBUMIN/GLOBULIN RATIO 1.3 -- 1.6 1.4 1.4 --  BUN/CREAT RATIO 24.0 13.8  15.6 19.7 12.3 24.1   ALT 8 -- _3 --  AST 15 -- 17 20 39 --  eGFR 107  93  90  96  50  105   08/16/22 Procalcitonin negative at 0.06  Related to Magnesium Component 08/13/22 08/12/22 02/19/22 09/26/21 09/25/21 09/23/21  Mg 1.6 1.3 Low  1.5 Low  1.6 1.5 Low  1.1 Low    CHOLESTEROL TOTAL 101 on 08/13/22 112 -- -- -- --  Trig 130 202 High  -- -- -- --  HDL 33 Low  35 Low  36 Low  39 Low  33 Low  40  LDL 42 37 52 66 65 --  VLDL 26 40 -- -- -- --  CHOL/HDL 3 3      TSH 08/13/22 elevated at 10.80, T4 normal at 5.5 Phos 08/13/22 normal at 3.7 ESR normal at 14 on 08/13/22 CRP elevated at 12.50 on 08/13/22 Vitamin B 12 was 261 on 08/15/22 with low normal folate 7.4  ABG 08/12/22 on room air:  Ref Range & Units 2 wk ago   PH ARTERIAL 7.350 - 7.450 7.454 High   PCO2 35.0 - 45.0 mmHg 40.5  PO2 ARTERIAL 80.0 - 100.0 mmHg 80.7  HCO3 21 - 28 mmol/L 28  BASE EXCESS -2 - 2 mmol/L 4 High   OXYGEN SATURATION MEASURED ART % 96.6  PATIENT TEMPERATURE Deg 98.6  FRACTION OF INSPIRED OXYGEN % 21  VENTILATION MODE ARTERIAL  room air  TOTAL RATE ART  18  ALLENS TEST  yes  RESPIRATORY CARE NUMBER OF STICKS [facility]  1  SITE  RR    08/17/22 08/12/22 07/14/22 02/19/22 09/23/21 09/19/21   WBC 6.7 9.9 7.1 9.3 7.9 8.9  RBC 5.09 4.82 4.80 4.99 4.51 5.44  HGB 15.3 14.5 14.7 15.1 13.7 16.3  HCT 46.2 43.1 42.8 44.3 39.1 Low  47.2  MCV 91 89 89 89 87 87  MCH 30.1 30.1 30.6 30.3 30.4 30.0  MCHC 33.1 33.6 34.3 34.1 35.0 34.5  Plt Ct 145 Low  161 153 159 171 173  RDW SD 43.6 43.0 42.3 44.8 43.3 44.2  MPV 9.6 9.8 9.1 9.4 9.3 8.9  NRBC% 0.0  0.0 0.0 0.0 0.0 0.0  NRBC 0.000 0.000 0.000 0.000 0.000 0.000  NEUTROPHIL % 60.8 75.4 High  72.2 High  71.3 High  61.9 73.7 High   LYMPHOCYTE % 22.2 Low  12.0 Low  17.1 Low  18.7 Low  22.9 Low  16.2 Low   MONOCYTE % 12.6 High  9.2 7.0 8.0 9.3 6.4  Eosinophil % 3.4 2.7 2.5 1.4 4.7 2.6  BASOPHIL % 0.4 0.3 0.4 0.3 0.4 0.6  IG% 0.600 High  0.400 0.800 High  0.300 0.800 High  0.500 High   ABSOLUTE NEUTROPHIL COUNT 4.06 7.48 5.14 6.62 4.88 6.54  ABSOLUTE LYMPHOCYTE COUNT 1.5 1.2 1.2 1.7 1.8 1.4  MONO ABSOLUTE 0.8 0.9 High  0.5 0.7 0.7 0.6  EOS ABSOLUTE 0.2 0.3 0.2 0.1 0.4 0.2  BASO ABSOLUTE 0.0 0.0 0.0 0.0 0.0 0.1  IG ABSOLUTE 0.040 High  0.040 High  0.060 High  0.030 0.060 High    08/12/22 SARS-CoV-2 neg, influenza a and B negative 08/12/22  Ammonia level normal at 43 08/12/22 Total Valproic acid level low at 36.0  CXR 08/16/22: IMPRESSION:  1. Low lung volumes with crowded bronchovascular markings and bibasilar opacities, nonspecific, but favored to represent atelectasis. An infectious/inflammatory process is difficult to exclude in the appropriate clinical setting.   08/13/22 EEG: EEG Description :  The waking background was seen very briefly and  consisted of a frequency of 5-6 Hz theta that was  poorly organized,  continuous, symmetric, and reactive. There was no discernable posterior  dominant rhythm, but there was good variability.    Photic stimulation and hyperventilation were not performed.   Drowsiness was evidenced by decreased muscle artifact.   No stage N2 sleep  architecture was attained.   There were no focal abnormalities, epileptiform abnormalities, or  electrographic seizures.        EEG Interpretation : This is abnormal awake and (mostly) drowsy EEG due to  generalized slowing of the background.   No seizures or epileptiform discharges.   Clinical Correlation : Findings indicate:  Moderate degree of encephalopathy (diffuse cerebral dysfunction) of  nonspecific  etiology.   I reviewed EMR for available labs, medications, imaging, studies and related documents.  No new records since last visit/Records reviewed and summarized above. MRU  ROS Unable to obtain from pt due to advanced dementia  Physical Exam: Current and past weights: 08/26/22 159 lbs 12.8 oz, 161 lbs 12.2 on 12/15 23, 185 lbs on 07/13/22 Constitutional: NAD General: frail appearing ENMT: intact hearing, oral mucous membranes slightly tacky, dentition intact CV: S1S2, slow regular bradycardic rhythm, no LE edema Pulmonary: CTAB, no increased work of breathing, no cough, room air Abdomen: hypo-active BS + 4 quadrants, soft and non tender, no ascites GU: deferred, wearing incontinence brief MSK: noted sarcopenia of BLE, Skin: warm and dry, no rashes or wounds on visible skin Neuro:  noted generalized weakness of left side, noted delayed response and at times no response to questions, question both receptive and expressive aphasia Psych: non-anxious affect, lethargic difficult to arouse initially, psychomotor retardation Hem/lymph/immuno: no widespread bruising  CURRENT PROBLEM LIST:  Patient Active Problem List   Diagnosis Date Noted   Encephalopathy acute 08/03/2019   Hx of Clostridium difficile infection    Diverticulitis 05/19/2014   Complete rotator cuff rupture of left shoulder 03/25/2013   GERD (gastroesophageal reflux disease) 12/12/2010   Hyperlipemia 12/12/2010   Insomnia 12/12/2010   ED (erectile dysfunction) 12/12/2010   BPH (benign prostatic hyperplasia) 12/12/2010   Depression with anxiety 12/12/2010   Genital warts 12/12/2010   Seborrhea 12/12/2010   Type 2 diabetes mellitus with diabetic neuropathy, without long-term  current use of insulin ([place]) 12/12/2010   DIVERTICULOSIS, COLON 11/10/2009   DEGENERATIVE [place] DISEASE 11/10/2009   COLONIC POLYPS, ADENOMATOUS, HX OF 11/10/2009   OBESITY 11/06/2009   PAST MEDICAL HISTORY:  Active Ambulatory Problems     Diagnosis Date Noted   OBESITY 11/06/2009   DIVERTICULOSIS, COLON 11/10/2009   DEGENERATIVE [place] DISEASE 11/10/2009   COLONIC POLYPS, ADENOMATOUS, HX OF 11/10/2009   GERD (gastroesophageal reflux disease) 12/12/2010   Hyperlipemia 12/12/2010   Insomnia 12/12/2010   ED (erectile dysfunction) 12/12/2010   BPH (benign prostatic hyperplasia) 12/12/2010   Depression with anxiety 12/12/2010   Genital warts 12/12/2010   Seborrhea 12/12/2010   Type 2 diabetes mellitus with diabetic neuropathy, without long-term current use of insulin ([place]) 12/12/2010   Complete rotator cuff rupture of left shoulder 03/25/2013   Diverticulitis 05/19/2014   Hx of Clostridium difficile infection    Encephalopathy acute 08/03/2019   Resolved Ambulatory Problems    Diagnosis Date Noted   ABDOMINAL PAIN, LOWER 11/06/2009   Hypothyroidism 12/12/2010   Past Medical History:  Diagnosis Date   Abdominal pain    Anxiety    Back pain    Bladder cancer ([place]) 2010   Bladder cancer ([place])    Diabetes mellitus    Diabetes mellitus without complication ([place])    Diverticulosis    High cholesterol    Hyperlipidemia    Hypertension    OSA (obstructive sleep apnea)    Pneumonia    Rash    SOCIAL HX:  Social History   Tobacco Use   Smoking status: Never   Smokeless tobacco: Never  Substance Use Topics   Alcohol use: Never   FAMILY HX:  Family History  Problem Relation Age of Onset   Parkinson's disease Paternal Grandfather    Rheum arthritis Paternal Grandfather    Heart attack Maternal Grandfather    Colon cancer Paternal Uncle    Bladder Cancer Paternal Uncle        Preferred Pharmacy: ALLERGIES:  Allergies  Allergen Reactions   Floxin [Ofloxacin] Hives and Other (See Comments)    Central Nervous System effects   Metronidazole Rash   Trulicity [Dulaglutide] Rash   Floxin I.V. In Dextrose 5% [Ofloxacin] Other (See Comments)    Central Nervous System effects   Keppra [Levetiracetam]      Itching ? rash   Flagyl [Metronidazole Hcl] Rash   Sulfa Antibiotics Rash   Sulfa Antibiotics Rash     PERTINENT MEDICATIONS:  Outpatient Encounter Medications as of 08/26/2022  Medication Sig   acetaminophen (TYLENOL) 500 MG tablet Take 500 mg by mouth 3 (three) times daily.   ALPRAZolam (XANAX) 0.5 MG tablet Take 1 tablet (0.5 mg total) by mouth 2 (two) times daily as needed for anxiety. (Patient taking differently: Take 0.25 mg by mouth 2 (two) times daily.)   amLODipine (NORVASC) 5 MG tablet Take 5 mg by mouth daily.   aspirin 81 MG chewable tablet Chew 1 tablet by mouth daily.   atorvastatin (LIPITOR) 40 MG tablet Take 40 mg by mouth daily.   baclofen (LIORESAL) 10 MG tablet Take 10 mg by mouth 2 (two) times daily.   Cholecalciferol 125 MCG (5000 UT) capsule Take 5,000 Units by mouth daily.   clopidogrel (PLAVIX) 75 MG tablet Take 75 mg by mouth daily.   empagliflozin (JARDIANCE) 25 MG TABS tablet Take by mouth.   escitalopram (LEXAPRO) 10 MG tablet Take 10 mg by mouth daily.   famotidine (PEPCID) 20 MG tablet Take 20  mg by mouth daily.   hydrALAZINE (APRESOLINE) 50 MG tablet Take 100 mg by mouth 3 (three) times daily.   isosorbide mononitrate (IMDUR) 30 MG 24 hr tablet Take 30 mg by mouth daily.   JARDIANCE 25 MG TABS tablet Take 25 mg by mouth daily.   levothyroxine (SYNTHROID) 175 MCG tablet Take by mouth.   metoprolol succinate (TOPROL-XL) 25 MG 24 hr tablet Take 37.5 mg by mouth 2 (two) times daily.   nystatin (MYCOSTATIN/NYSTOP) powder Apply 1 Application topically 2 (two) times daily. Abd folds and groin   omeprazole (PRILOSEC) 20 MG capsule Take 20 mg by mouth daily.   potassium chloride (KLOR-CON) 10 MEQ tablet Take 10 mEq by mouth 2 (two) times daily.   QUEtiapine (SEROQUEL) 50 MG tablet Take 50 mg by mouth 3 (three) times daily.   divalproex (DEPAKOTE ER) 500 MG 24 hr tablet Take by mouth. (Patient not taking: Reported on 08/26/2022)   loperamide (IMODIUM A-D) 2 MG tablet  Take 2 mg by mouth as needed for diarrhea or loose stools.   nitroGLYCERIN (NITROSTAT) 0.4 MG SL tablet Place 0.4 mg under the tongue every 5 (five) minutes as needed for chest pain.   [DISCONTINUED] amLODipine-benazepril (LOTREL) 10-20 MG capsule    [DISCONTINUED] benazepril (LOTENSIN) 20 MG tablet Take 20 mg by mouth daily. (Patient not taking: Reported on 08/26/2022)   [DISCONTINUED] CINNAMON PO Take 1,000 mg by mouth daily.   [DISCONTINUED] dicyclomine (BENTYL) 20 MG tablet Take by mouth.   [DISCONTINUED] EUTHYROX 175 MCG tablet Take 175 mcg by mouth every morning.   [DISCONTINUED] furosemide (LASIX) 20 MG tablet Take 20 mg by mouth daily as needed. (Patient not taking: Reported on 08/26/2022)   [DISCONTINUED] gabapentin (NEURONTIN) 300 MG capsule Take 300 mg by mouth 3 (three) times daily. (Patient not taking: Reported on 08/26/2022)   [DISCONTINUED] HYDROcodone-acetaminophen (NORCO/VICODIN) 5-325 MG tablet TAKE ONE TABLET BY MOUTH 2 (TWO) TIMES A DAY AS NEEDED FOR PAIN. (Patient not taking: Reported on 08/26/2022)   [DISCONTINUED] ibuprofen (ADVIL,MOTRIN) 400 MG tablet Take 1 tablet (400 mg total) by mouth every 6 (six) hours as needed. (Patient not taking: Reported on 08/26/2022)   [DISCONTINUED] Insulin Glargine (LANTUS SOLOSTAR) 100 UNIT/ML Solostar Pen INJECT 40 UNITS SUBCUTANEOUSLY ONCE DAILY IN THE [place] AT 8PM (Patient not taking: Reported on 08/26/2022)   [DISCONTINUED] lidocaine (LIDODERM) 5 % Place 1 patch onto the skin daily. Remove & Discard patch within 12 hours or as directed by MD (Patient not taking: Reported on 08/26/2022)   [DISCONTINUED] meclizine (ANTIVERT) 25 MG tablet TAKE 1 TABLET BY MOUTH AT BEDTIME (Patient not taking: Reported on 08/26/2022)   [DISCONTINUED] metFORMIN (GLUCOPHAGE) 1000 MG tablet Take 1 tablet by mouth twice daily (Patient not taking: Reported on 08/26/2022)   [DISCONTINUED] methocarbamol (ROBAXIN) 500 MG tablet Take 1 tablet (500 mg total) by mouth 2  (two) times daily.   [DISCONTINUED] Multiple Vitamin (MULTIVITAMIN WITH MINERALS) TABS Take 1 tablet by mouth daily. (Patient not taking: Reported on 08/26/2022)   [DISCONTINUED] naloxone (NARCAN) nasal spray 4 mg/0.1 mL Place into the nose. (Patient not taking: Reported on 08/26/2022)   [DISCONTINUED] OZEMPIC, 1 MG/DOSE, 2 MG/1.5ML SOPN Inject 0.75 mLs into the skin once a week.   [DISCONTINUED] pantoprazole (PROTONIX) 40 MG tablet Take 1 tablet by mouth once daily   [DISCONTINUED] rosuvastatin (CRESTOR) 10 MG tablet Take 10 mg by mouth at bedtime.   [DISCONTINUED] sitaGLIPtin (JANUVIA) 100 MG tablet Take by mouth.   [DISCONTINUED] tadalafil (CIALIS) 5 MG  tablet Take 5 mg by mouth daily as needed for erectile dysfunction.   [DISCONTINUED] zolpidem (AMBIEN) 10 MG tablet Take 1 tablet (10 mg total) by mouth at bedtime as needed for sleep.   No facility-administered encounter medications on file as of 08/26/2022.     -------------------------------------------------------------------------------------------------------------------------------------------------------------------------------------------------------------------------------------------- Advance Care Planning/Goals of Care: Goals include to maximize quality of life and symptom management. Patient/health care surrogate gave his/her permission to discuss.Our advance care planning conversation included a discussion about:    The value and importance of advance care planning  Experiences with loved ones who have been seriously ill or have died  Exploration of personal, cultural or spiritual beliefs that might influence medical decisions  Exploration of goals of care in the event of a sudden injury or illness  Identification  of a healthcare agent-daughter Oretha Milch [phone] Review and updating or creation of an  advance directive document . Decision not to resuscitate or to de-escalate disease focused treatments due to poor  prognosis. CODE STATUS: DNR     Thank you for the opportunity to participate in the care of Mr. Copes.  The palliative care team will continue to follow. Please call our office at [phone] if we can be of additional assistance.   Marijo Conception, FNP-C  COVID-19 PATIENT SCREENING TOOL Asked and negative response unless otherwise noted:  Have you had symptoms of covid, tested positive or been in contact with someone with symptoms/positive test in the past 5-10 days?

## 2022-08-26 NOTE — Progress Notes (Incomplete)
[facility] Palliative Care Consult Note Telephone: [phone]  Fax: [phone]   Date of encounter: 08/26/22 11:41 AM PATIENT NAME: Dan [name] [place] [address]   [phone] (home)  DOB: 1946-08-06 MRN: [id number] PRIMARY CARE PROVIDER:    Azell Der, MD,  [address] [place] [address] [phone]  REFERRING PROVIDER:   Azell Der, MD [address] [place],  [place] [address] [phone]  RESPONSIBLE PARTY:    Contact Information     Name Relation Home Work Mobile   [place] Spouse [phone]        I met face to face with patient in [facility]. Palliative Care was asked to follow this patient by consultation request of  Dan [name], Mayford Knife, MD to address advance care planning and complex medical decision making. Spoke with pt's daughter's Dan [name] and Dan [name] by phone conference call.  This is the initial visit.          ASSESSMENT, SYMPTOM MANAGEMENT AND PLAN / RECOMMENDATIONS:     Follow up Palliative Care Visit: Palliative care will continue to follow for complex medical decision making, advance care planning, and clarification of goals. Return 2-3 weeks or prn.    This visit was coded based on medical decision making (MDM).  PPS: 30%  HOSPICE ELIGIBILITY/DIAGNOSIS: TBD  Chief Complaint:   [facility] received a referral to follow up with patient for chronic disease management in setting of dementia.  Palliative Care is also following for advance directive planning and defining/refining goals of care.    HISTORY OF PRESENT ILLNESS:  Dan [name] is a 76 y.o. year old male  with likely vascular dementia, Type 2 DM with diabetic neuropathy, degenerative disc disease, hx of stroke with left sided hemiparesis, GERD, BPH, HLD, obesity, and hx of C diff infection. Has remote hx of bladder cancer 2012 seen  by Dr Dan [name] with [facility]. Recently pt was noted at his ALF to have increasing difficulty with speech and increased confusion so he went to [facility] on 08/12/22 where he was admitted.  Pt has old hx of stroke and approximately 5 years ago was noted to have a possible seizure although nothing was definitive on scans he was started on Depakote by his Neurologist Dr Dan [name]. He was also noted to have increasing weakness of his already weaker non-dominant left side and was observed by facility staff with some seizure like activity. Working diagnosis was that acute encephalopathy might be post-ictal.  He was on Depakote 500 mg daily.  He was noted to have stercoral colitis on CT abd/pelvis and difficulty swallowing.  He does have a prior hx of SDH. With the witnessed episode there was not accompanied by incontinence of urine or stool. On arrival he was noted to be hypertensive with BP 200/90.   On review of records from [facility] and [facility] pt had multiple ED visits for falls. Majority of inforrmation obtained from chart review with supplementation from discussion with daughters, Dan [name] [phone] and she conferenced in her sister Dan [name] [phone].  They share HC POA.  Dan [name] states that approximately 1 month ago pt was doing stand/pivot with 2 person assist at his ALF [facility].  She does not recall him having had a seizure any time recently.  He was eating solid foods, taking his pills and able to feed himself.  She states that he has not  had a seizure.  Both daughters are unconvinced he has ever had a seizure as they have never witnessed one and it was not noted on any scans.  Dan [name] describes that pt was agitated and pounding on the wall when he was started on Depakote. He was seen in the ED on 11.6/23 after a fall where he hit his face.  In the last month they have been concerned that he is losing weight and has been rapidly declining.  2 weeks prior to ED visit, pt was  conversing some and feeding himself.  Daughters state he will eat currently but has to be fed and that he is now requiring pureed foods, medicines crushed in applesauce.  They said pt was very constipated but they didn't understand because he had been given Imodium for "diarrhea".  Explained the normal stepwise progression of dementia that tends to worsen with each insult.  Initially pt may improve but doesn't usually return to baseline but eventually just continues to decline.  Pt's with dementia tend to eventually have issues with swallowing and also with chronic constipation.  Advised that there was evidence of significant constipation on the CT abd and stercoral colitis.  Explained the body attempts to pull in water to alleviate the impaction (a hard stool ball) so what is seen is the liquid stool or "overflow diarrhea".  Explained I would recommend a bowel regimen, avoidance of medicines like Imodium as these will tend to make constipation worse and it becomes a vicious cycle. Both daughters stated their father had been talking some about 2 weeks prior to his 12/6 visit then he just stopped responding altogether except with an occasional head nod yes or no. On approach with this provider when asked if he was in pain he said "yes" once awakened, asked where and he didn't answer.  Asked if he was in church and he said "No".  He did not make eye contact nor did he talk any further.  Both daughters would like to simplify his regimen, treat with antibiotics if can be done orally with improvement or otherwise stop treatment.  T  History obtained from review of EMR, discussion with primary team, and interview with family, facility staff/caregiver and/or Dan [name].   08/17/22 UA:  Ref Range & Units 9 d ago  Urine Color Yellow Yellow  Urine Appearance Clear Clear  Urine Specific Gravity 1.005 - 1.030 1.010  Urine pH 5 to 9 6.5  Urine Protein - Dipstick Negative mg/dl Negative  Urine Glucose Negative mg/dL 500  Abnormal   Urine Ketones Negative mg/dl 10 Abnormal   Urine Bilirubin Negative mg/dL Negative  Urine Blood Negative mg/dL Negative  Urine Nitrite Negative Negative  Urine Urobilinogen <2 mg/dl <2  Urine Leukocyte Esterase Negative Leu/mcL Negative  UA Microscopic No Micro No Micro  Resulting Agency  [facility]  Specimen Collected: 08/17/22 02:59   Performed by: [facility] Last Resulted: 08/17/22 03:   Component 08/14/22 08/13/22 08/12/22 07/14/22 02/19/22 09/26/21  Na 140 140 141 143 137 140  Potassium 4.1 3.2 Low  3.6 Low  3.8 3.8 3.9  Cl 107 101 100 102 98 104  CO2 _0 AGAP _1 Glucose 130 High  134 High   126 High  144 High  109 High  124 High    BUN _2 Creatinine 0.50 Low  0.80  0.90 0.71 Low  1.46 High  0.54 Low    Ca 9.2 9.2  9.5 9.4 10.0 8.8   ALK PHOS 107 -- 131 138 106 --  T Bili 0.43 -- 0.41 0.40 0.81 --  Total Protein 6.3 -- 7.1 7.1 7.4 --  Alb 3.5 -- 4.4 4.1 4.3 --  GLOBULIN 2.8 -- 2.7 3.0 3.1 --  ALBUMIN/GLOBULIN RATIO 1.3 -- 1.6 1.4 1.4 --  BUN/CREAT RATIO 24.0 13.8  15.6 19.7 12.3 24.1   ALT 8 -- _0 --  AST 15 -- 17 20 39 --  eGFR 107  93  90  96  50  105   08/16/22 Procalcitonin negative at 0.06  Related to Magnesium Component 08/13/22 08/12/22 02/19/22 09/26/21 09/25/21 09/23/21  Mg 1.6 1.3 Low  1.5 Low  1.6 1.5 Low  1.1 Low    CHOLESTEROL TOTAL 101 on 08/13/22 112 -- -- -- --  Trig 130 202 High  -- -- -- --  HDL 33 Low  35 Low  36 Low  39 Low  33 Low  40  LDL 42 37 52 66 65 --  VLDL 26 40 -- -- -- --  CHOL/HDL 3 3      TSH 08/13/22 elevated at 10.80, T4 normal at 5.5 Phos 08/13/22 normal at 3.7 ESR normal at 14 on 08/13/22 CRP elevated at 12.50 on 08/13/22 Vitamin B 12 was 261 on 08/15/22 with low normal folate 7.4  ABG 08/12/22 on room air:  Ref Range & Units 2 wk ago   PH ARTERIAL 7.350 - 7.450 7.454 High   PCO2 35.0 - 45.0 mmHg 40.5  PO2 ARTERIAL 80.0 - 100.0 mmHg 80.7   HCO3 21 - 28 mmol/L 28  BASE EXCESS -2 - 2 mmol/L 4 High   OXYGEN SATURATION MEASURED ART % 96.6  PATIENT TEMPERATURE Deg 98.6  FRACTION OF INSPIRED OXYGEN % 21  VENTILATION MODE ARTERIAL  room air  TOTAL RATE ART  18  ALLENS TEST  yes  RESPIRATORY CARE NUMBER OF STICKS [facility]  1  SITE  RR    08/17/22 08/12/22 07/14/22 02/19/22 09/23/21 09/19/21   WBC 6.7 9.9 7.1 9.3 7.9 8.9  RBC 5.09 4.82 4.80 4.99 4.51 5.44  HGB 15.3 14.5 14.7 15.1 13.7 16.3  HCT 46.2 43.1 42.8 44.3 39.1 Low  47.2  MCV 91 89 89 89 87 87  MCH 30.1 30.1 30.6 30.3 30.4 30.0  MCHC 33.1 33.6 34.3 34.1 35.0 34.5  Plt Ct 145 Low  161 153 159 171 173  RDW SD 43.6 43.0 42.3 44.8 43.3 44.2  MPV 9.6 9.8 9.1 9.4 9.3 8.9  NRBC% 0.0 0.0 0.0 0.0 0.0 0.0  NRBC 0.000 0.000 0.000 0.000 0.000 0.000  NEUTROPHIL % 60.8 75.4 High  72.2 High  71.3 High  61.9 73.7 High   LYMPHOCYTE % 22.2 Low  12.0 Low  17.1 Low  18.7 Low  22.9 Low  16.2 Low   MONOCYTE % 12.6 High  9.2 7.0 8.0 9.3 6.4  Eosinophil % 3.4 2.7 2.5 1.4 4.7 2.6  BASOPHIL % 0.4 0.3 0.4 0.3 0.4 0.6  IG% 0.600 High  0.400 0.800 High  0.300 0.800 High  0.500 High   ABSOLUTE NEUTROPHIL COUNT 4.06 7.48 5.14 6.62 4.88 6.54  ABSOLUTE LYMPHOCYTE COUNT 1.5 1.2 1.2 1.7 1.8 1.4  MONO ABSOLUTE 0.8 0.9 High  0.5 0.7 0.7 0.6  EOS ABSOLUTE 0.2 0.3 0.2 0.1 0.4 0.2  BASO ABSOLUTE 0.0 0.0 0.0 0.0 0.0  0.1  IG ABSOLUTE 0.040 High  0.040 High  0.060 High  0.030 0.060 High    08/12/22 SARS-CoV-2 neg, influenza a and B negative 08/12/22  Ammonia level normal at 43 08/12/22 Total Valproic acid level low at 36.0  CXR 08/16/22: IMPRESSION:  1. Low lung volumes with crowded bronchovascular markings and bibasilar opacities, nonspecific, but favored to represent atelectasis. An infectious/inflammatory process is difficult to exclude in the appropriate clinical setting.   08/13/22 EEG: EEG Description :  The waking background was seen very briefly and  consisted of a frequency of 5-6 Hz theta that  was  poorly organized,  continuous, symmetric, and reactive. There was no discernable posterior  dominant rhythm, but there was good variability.    Photic stimulation and hyperventilation were not performed.   Drowsiness was evidenced by decreased muscle artifact.   No stage N2 sleep  architecture was attained.   There were no focal abnormalities, epileptiform abnormalities, or  electrographic seizures.        EEG Interpretation : This is abnormal awake and (mostly) drowsy EEG due to  generalized slowing of the background.   No seizures or epileptiform discharges.   Clinical Correlation : Findings indicate:  Moderate degree of encephalopathy (diffuse cerebral dysfunction) of  nonspecific etiology.   MRI head wo IV contrast 08/12/22: FINDINGS:   The ventricles are normal in size. There is no acute hemorrhage, mass lesion, mass effect or midline shift.  There is some susceptibility artifact in the right thalamus and anterior to the atrium of right lateral ventricle. This is compatible with sequela of remote hemorrhage.  No areas of restricted diffusion to suggest acute infarct.  There is some moderate T2/FLAIR hyperintensity in periventricular and subcortical white matter which is nonspecific although commonly and likely relates to chronic small vessel ischemic change.  There is moderate cerebral atrophy.  Bony and vascular structures are unremarkable. The visualized paranasal sinuses and mastoids are unremarkable.     IMPRESSION:  No acute intracranial abnormality.  Chronic findings as above.   CT head wo IV contrast:08/12/22: FINDINGS:  No hyperdense MCA sign.  Small vessel ischemic change.  No evidence of hydrocephalus.  No intracranial mass, mass effect or midline shift.  No acute infarction evident.  No intracranial hemorrhage.  Paranasal sinuses: Clear.  Mastoid air cells: Clear.  Calvarium: Intact.    IMPRESSION:  No acute intracranial abnormality.   CTA  Pulmonary 08/12/22: IMPRESSION:  1. No pulmonary embolism.  2.  No acute findings.   CT Abd/pelvis w contrast 08/12/22: FINDINGS:   VISUALIZED LOWER THORAX:  No acute abnormalities.   SOLID VISCERA:  Liver: Normal.  Gallbladder: Surgically absent.  Pancreas: Normal.  Adrenal glands: Left adrenal nodule was present previously.  Spleen: Normal.  Kidneys: Normal.   GI:  No bowel obstruction.  No focal bowel wall thickening or inflammatory changes.  Large amount of increased pain and fecal material.  Rectal wall thickening. Rectum is distended with fecal material.  Sutures at the junction of the descending colon and sigmoid colon.   PERITONEAL CAVITY/RETROPERITONEUM:  No free fluid.  No pneumoperitoneum.  No lymphadenopathy.  Atherosclerotic vascular disease without aneurysm.   PELVIS:  Circumferential bladder wall thickening.   MUSCULOSKELETAL:  No acute or destructive osseous processes.   MISC:  N/A.    IMPRESSION:  1. Constipation.  2.  Stercoral colitis.    Old CT head  code stroke wo contrast 05/28/21: FINDINGS: Right thalamic and basal ganglia hemorrhage measuring 40 x 21 mm  is stable in size. Blood is seen entering the lateral ventricular system. No hydrocephalus.    I reviewed EMR for available labs, medications, imaging, studies and related documents.  No new records since last visit/Records reviewed and summarized above. MRU  ROS Unable to obtain from pt due to advanced dementia  Physical Exam: Current and past weights: 08/26/22 159 lbs 12.8 oz, 161 lbs 12.2 on 12/15 23, 185 lbs on 07/13/22 Constitutional: NAD General: frail appearing ENMT: intact hearing, oral mucous membranes slightly tacky, dentition intact CV: S1S2, slow regular bradycardic rhythm, no LE edema Pulmonary: CTAB, no increased work of breathing, no cough, room air Abdomen: hypo-active BS + 4 quadrants, soft and non tender, no ascites GU: deferred, wearing incontinence brief MSK:  noted sarcopenia of BLE, Skin: warm and dry, no rashes or wounds on visible skin Neuro:  noted generalized weakness of left side, noted delayed response and at times no response to questions, question both receptive and expressive aphasia Psych: non-anxious affect, lethargic difficult to arouse initially, psychomotor retardation Hem/lymph/immuno: no widespread bruising  CURRENT PROBLEM LIST:  Patient Active Problem List   Diagnosis Date Noted  . Encephalopathy acute 08/03/2019  . Hx of Clostridium difficile infection   . Diverticulitis 05/19/2014  . Complete rotator cuff rupture of left shoulder 03/25/2013  . GERD (gastroesophageal reflux disease) 12/12/2010  . Hyperlipemia 12/12/2010  . Insomnia 12/12/2010  . ED (erectile dysfunction) 12/12/2010  . BPH (benign prostatic hyperplasia) 12/12/2010  . Depression with anxiety 12/12/2010  . Genital warts 12/12/2010  . Seborrhea 12/12/2010  . Type 2 diabetes mellitus with diabetic neuropathy, without long-term current use of insulin ([place]) 12/12/2010  . DIVERTICULOSIS, COLON 11/10/2009  . DEGENERATIVE DISC DISEASE 11/10/2009  . COLONIC POLYPS, ADENOMATOUS, HX OF 11/10/2009  . OBESITY 11/06/2009   PAST MEDICAL HISTORY:  Active Ambulatory Problems    Diagnosis Date Noted  . OBESITY 11/06/2009  . DIVERTICULOSIS, COLON 11/10/2009  . DEGENERATIVE DISC DISEASE 11/10/2009  . COLONIC POLYPS, ADENOMATOUS, HX OF 11/10/2009  . GERD (gastroesophageal reflux disease) 12/12/2010  . Hyperlipemia 12/12/2010  . Insomnia 12/12/2010  . ED (erectile dysfunction) 12/12/2010  . BPH (benign prostatic hyperplasia) 12/12/2010  . Depression with anxiety 12/12/2010  . Genital warts 12/12/2010  . Seborrhea 12/12/2010  . Type 2 diabetes mellitus with diabetic neuropathy, without long-term current use of insulin ([place]) 12/12/2010  . Complete rotator cuff rupture of left shoulder 03/25/2013  . Diverticulitis 05/19/2014  . Hx of Clostridium difficile infection    . Encephalopathy acute 08/03/2019   Resolved Ambulatory Problems    Diagnosis Date Noted  . ABDOMINAL PAIN, LOWER 11/06/2009  . Hypothyroidism 12/12/2010   Past Medical History:  Diagnosis Date  . Abdominal pain   . Anxiety   . Back pain   . Bladder cancer ([place]) 2010  . Bladder cancer ([place])   . Diabetes mellitus   . Diabetes mellitus without complication ([place])   . Diverticulosis   . High cholesterol   . Hyperlipidemia   . Hypertension   . OSA (obstructive sleep apnea)   . Pneumonia   . Rash    SOCIAL HX:  Social History   Tobacco Use  . Smoking status: Never  . Smokeless tobacco: Never  Substance Use Topics  . Alcohol use: Never   FAMILY HX:  Family History  Problem Relation Age of Onset  . Parkinson's disease Paternal Grandfather   . Rheum arthritis Paternal Grandfather   . Heart attack Maternal Grandfather   . Colon cancer Paternal  Uncle   . Bladder Cancer Paternal Uncle        Preferred Pharmacy: ALLERGIES:  Allergies  Allergen Reactions  . Floxin [Ofloxacin] Hives and Other (See Comments)    Central Nervous System effects  . Metronidazole Rash  . Trulicity [Dulaglutide] Rash  . Floxin I.V. In Dextrose 5% [Ofloxacin] Other (See Comments)    Central Nervous System effects  . Keppra [Levetiracetam]     Itching ? rash  . Flagyl [Metronidazole Hcl] Rash  . Sulfa Antibiotics Rash  . Sulfa Antibiotics Rash     PERTINENT MEDICATIONS:  Outpatient Encounter Medications as of 08/26/2022  Medication Sig  . acetaminophen (TYLENOL) 500 MG tablet Take 500 mg by mouth 3 (three) times daily.  Marland Kitchen ALPRAZolam (XANAX) 0.5 MG tablet Take 1 tablet (0.5 mg total) by mouth 2 (two) times daily as needed for anxiety. (Patient taking differently: Take 0.25 mg by mouth 2 (two) times daily.)  . amLODipine (NORVASC) 5 MG tablet Take 5 mg by mouth daily.  Marland Kitchen aspirin 81 MG chewable tablet Chew 1 tablet by mouth daily.  Marland Kitchen atorvastatin (LIPITOR) 40 MG tablet Take 40 mg by mouth  daily.  . baclofen (LIORESAL) 10 MG tablet Take 10 mg by mouth 2 (two) times daily.  . Cholecalciferol 125 MCG (5000 UT) capsule Take 5,000 Units by mouth daily.  . clopidogrel (PLAVIX) 75 MG tablet Take 75 mg by mouth daily.  . empagliflozin (JARDIANCE) 25 MG TABS tablet Take by mouth.  . escitalopram (LEXAPRO) 10 MG tablet Take 10 mg by mouth daily.  . famotidine (PEPCID) 20 MG tablet Take 20 mg by mouth daily.  . hydrALAZINE (APRESOLINE) 50 MG tablet Take 100 mg by mouth 3 (three) times daily.  . isosorbide mononitrate (IMDUR) 30 MG 24 hr tablet Take 30 mg by mouth daily.  Marland Kitchen JARDIANCE 25 MG TABS tablet Take 25 mg by mouth daily.  Marland Kitchen levothyroxine (SYNTHROID) 175 MCG tablet Take by mouth.  . metoprolol succinate (TOPROL-XL) 25 MG 24 hr tablet Take 37.5 mg by mouth 2 (two) times daily.  Marland Kitchen nystatin (MYCOSTATIN/NYSTOP) powder Apply 1 Application topically 2 (two) times daily. Abd folds and groin  . omeprazole (PRILOSEC) 20 MG capsule Take 20 mg by mouth daily.  . potassium chloride (KLOR-CON) 10 MEQ tablet Take 10 mEq by mouth 2 (two) times daily.  . QUEtiapine (SEROQUEL) 50 MG tablet Take 50 mg by mouth 3 (three) times daily.  . divalproex (DEPAKOTE ER) 500 MG 24 hr tablet Take by mouth. (Patient not taking: Reported on 08/26/2022)  . loperamide (IMODIUM A-D) 2 MG tablet Take 2 mg by mouth as needed for diarrhea or loose stools.  . nitroGLYCERIN (NITROSTAT) 0.4 MG SL tablet Place 0.4 mg under the tongue every 5 (five) minutes as needed for chest pain.  . [DISCONTINUED] amLODipine-benazepril (LOTREL) 10-20 MG capsule   . [DISCONTINUED] benazepril (LOTENSIN) 20 MG tablet Take 20 mg by mouth daily. (Patient not taking: Reported on 08/26/2022)  . [DISCONTINUED] CINNAMON PO Take 1,000 mg by mouth daily.  . [DISCONTINUED] dicyclomine (BENTYL) 20 MG tablet Take by mouth.  . [DISCONTINUED] EUTHYROX 175 MCG tablet Take 175 mcg by mouth every morning.  . [DISCONTINUED] furosemide (LASIX) 20 MG tablet  Take 20 mg by mouth daily as needed. (Patient not taking: Reported on 08/26/2022)  . [DISCONTINUED] gabapentin (NEURONTIN) 300 MG capsule Take 300 mg by mouth 3 (three) times daily. (Patient not taking: Reported on 08/26/2022)  . [DISCONTINUED] HYDROcodone-acetaminophen (NORCO/VICODIN) 5-325 MG tablet TAKE ONE  TABLET BY MOUTH 2 (TWO) TIMES A DAY AS NEEDED FOR PAIN. (Patient not taking: Reported on 08/26/2022)  . [DISCONTINUED] ibuprofen (ADVIL,MOTRIN) 400 MG tablet Take 1 tablet (400 mg total) by mouth every 6 (six) hours as needed. (Patient not taking: Reported on 08/26/2022)  . [DISCONTINUED] Insulin Glargine (LANTUS SOLOSTAR) 100 UNIT/ML Solostar Pen INJECT 40 UNITS SUBCUTANEOUSLY ONCE DAILY IN THE EVEING AT 8PM (Patient not taking: Reported on 08/26/2022)  . [DISCONTINUED] lidocaine (LIDODERM) 5 % Place 1 patch onto the skin daily. Remove & Discard patch within 12 hours or as directed by MD (Patient not taking: Reported on 08/26/2022)  . [DISCONTINUED] meclizine (ANTIVERT) 25 MG tablet TAKE 1 TABLET BY MOUTH AT BEDTIME (Patient not taking: Reported on 08/26/2022)  . [DISCONTINUED] metFORMIN (GLUCOPHAGE) 1000 MG tablet Take 1 tablet by mouth twice daily (Patient not taking: Reported on 08/26/2022)  . [DISCONTINUED] methocarbamol (ROBAXIN) 500 MG tablet Take 1 tablet (500 mg total) by mouth 2 (two) times daily.  . [DISCONTINUED] Multiple Vitamin (MULTIVITAMIN WITH MINERALS) TABS Take 1 tablet by mouth daily. (Patient not taking: Reported on 08/26/2022)  . [DISCONTINUED] naloxone (NARCAN) nasal spray 4 mg/0.1 mL Place into the nose. (Patient not taking: Reported on 08/26/2022)  . [DISCONTINUED] OZEMPIC, 1 MG/DOSE, 2 MG/1.5ML SOPN Inject 0.75 mLs into the skin once a week.  . [DISCONTINUED] pantoprazole (PROTONIX) 40 MG tablet Take 1 tablet by mouth once daily  . [DISCONTINUED] rosuvastatin (CRESTOR) 10 MG tablet Take 10 mg by mouth at bedtime.  . [DISCONTINUED] sitaGLIPtin (JANUVIA) 100 MG tablet Take  by mouth.  . [DISCONTINUED] tadalafil (CIALIS) 5 MG tablet Take 5 mg by mouth daily as needed for erectile dysfunction.  . [DISCONTINUED] zolpidem (AMBIEN) 10 MG tablet Take 1 tablet (10 mg total) by mouth at bedtime as needed for sleep.   No facility-administered encounter medications on file as of 08/26/2022.     -------------------------------------------------------------------------------------------------------------------------------------------------------------------------------------------------------------------------------------------- Advance Care Planning/Goals of Care: Goals include to maximize quality of life and symptom management. Patient/health care surrogate gave his/her permission to discuss.Our advance care planning conversation included a discussion about:    The value and importance of advance care planning  Experiences with loved ones who have been seriously ill or have died  Exploration of personal, cultural or spiritual beliefs that might influence medical decisions  Exploration of goals of care in the event of a sudden injury or illness  Identification  of a healthcare agent-daughter Dan [name] [phone] Review and updating or creation of an  advance directive document . Decision not to resuscitate or to de-escalate disease focused treatments due to poor prognosis. CODE STATUS: DNR     Thank you for the opportunity to participate in the care of Mr. Ancrum.  The palliative care team will continue to follow. Please call our office at [phone] if we can be of additional assistance.   Marijo Conception, FNP-C  COVID-19 PATIENT SCREENING TOOL Asked and negative response unless otherwise noted:  Have you had symptoms of covid, tested positive or been in contact with someone with symptoms/positive test in the past 5-10 days?

## 2022-08-27 DIAGNOSIS — R1310 Dysphagia, unspecified: Secondary | ICD-10-CM | POA: Insufficient documentation

## 2022-08-27 DIAGNOSIS — G8929 Other chronic pain: Secondary | ICD-10-CM | POA: Diagnosis not present

## 2022-08-27 DIAGNOSIS — E876 Hypokalemia: Secondary | ICD-10-CM | POA: Diagnosis not present

## 2022-08-27 DIAGNOSIS — R5381 Other malaise: Secondary | ICD-10-CM | POA: Diagnosis not present

## 2022-08-27 DIAGNOSIS — I10 Essential (primary) hypertension: Secondary | ICD-10-CM | POA: Diagnosis not present

## 2022-08-27 DIAGNOSIS — K633 Ulcer of intestine: Secondary | ICD-10-CM | POA: Insufficient documentation

## 2022-08-27 DIAGNOSIS — E785 Hyperlipidemia, unspecified: Secondary | ICD-10-CM | POA: Diagnosis not present

## 2022-08-27 DIAGNOSIS — K5900 Constipation, unspecified: Secondary | ICD-10-CM | POA: Insufficient documentation

## 2022-08-27 DIAGNOSIS — F0390 Unspecified dementia without behavioral disturbance: Secondary | ICD-10-CM | POA: Diagnosis not present

## 2022-08-27 DIAGNOSIS — K219 Gastro-esophageal reflux disease without esophagitis: Secondary | ICD-10-CM | POA: Diagnosis not present

## 2022-08-27 DIAGNOSIS — K5289 Other specified noninfective gastroenteritis and colitis: Secondary | ICD-10-CM | POA: Diagnosis not present

## 2022-08-27 DIAGNOSIS — E039 Hypothyroidism, unspecified: Secondary | ICD-10-CM | POA: Diagnosis not present

## 2022-08-27 DIAGNOSIS — R634 Abnormal weight loss: Secondary | ICD-10-CM | POA: Insufficient documentation

## 2022-08-27 DIAGNOSIS — Z7189 Other specified counseling: Secondary | ICD-10-CM | POA: Insufficient documentation

## 2022-08-27 DIAGNOSIS — F01C0 Vascular dementia, severe, without behavioral disturbance, psychotic disturbance, mood disturbance, and anxiety: Secondary | ICD-10-CM | POA: Insufficient documentation

## 2022-09-04 DIAGNOSIS — K5289 Other specified noninfective gastroenteritis and colitis: Secondary | ICD-10-CM | POA: Diagnosis not present

## 2022-09-04 DIAGNOSIS — E039 Hypothyroidism, unspecified: Secondary | ICD-10-CM | POA: Diagnosis not present

## 2022-09-04 DIAGNOSIS — E785 Hyperlipidemia, unspecified: Secondary | ICD-10-CM | POA: Diagnosis not present

## 2022-09-04 DIAGNOSIS — K219 Gastro-esophageal reflux disease without esophagitis: Secondary | ICD-10-CM | POA: Diagnosis not present

## 2022-09-04 DIAGNOSIS — L219 Seborrheic dermatitis, unspecified: Secondary | ICD-10-CM | POA: Diagnosis not present

## 2022-09-04 DIAGNOSIS — I10 Essential (primary) hypertension: Secondary | ICD-10-CM | POA: Diagnosis not present

## 2022-09-04 DIAGNOSIS — E876 Hypokalemia: Secondary | ICD-10-CM | POA: Diagnosis not present

## 2022-09-04 DIAGNOSIS — I2510 Atherosclerotic heart disease of native coronary artery without angina pectoris: Secondary | ICD-10-CM | POA: Diagnosis not present

## 2022-09-05 DIAGNOSIS — F321 Major depressive disorder, single episode, moderate: Secondary | ICD-10-CM | POA: Diagnosis not present

## 2022-09-05 DIAGNOSIS — I209 Angina pectoris, unspecified: Secondary | ICD-10-CM | POA: Diagnosis not present

## 2022-09-10 DIAGNOSIS — K5289 Other specified noninfective gastroenteritis and colitis: Secondary | ICD-10-CM | POA: Diagnosis not present

## 2022-09-10 DIAGNOSIS — R5381 Other malaise: Secondary | ICD-10-CM | POA: Diagnosis not present

## 2022-09-10 DIAGNOSIS — K219 Gastro-esophageal reflux disease without esophagitis: Secondary | ICD-10-CM | POA: Diagnosis not present

## 2022-09-10 DIAGNOSIS — E785 Hyperlipidemia, unspecified: Secondary | ICD-10-CM | POA: Diagnosis not present

## 2022-09-10 DIAGNOSIS — E039 Hypothyroidism, unspecified: Secondary | ICD-10-CM | POA: Diagnosis not present

## 2022-09-10 DIAGNOSIS — G8929 Other chronic pain: Secondary | ICD-10-CM | POA: Diagnosis not present

## 2022-09-10 DIAGNOSIS — I10 Essential (primary) hypertension: Secondary | ICD-10-CM | POA: Diagnosis not present

## 2022-09-10 DIAGNOSIS — E559 Vitamin D deficiency, unspecified: Secondary | ICD-10-CM | POA: Diagnosis not present

## 2022-09-10 DIAGNOSIS — E876 Hypokalemia: Secondary | ICD-10-CM | POA: Diagnosis not present

## 2022-09-15 DIAGNOSIS — Z741 Need for assistance with personal care: Secondary | ICD-10-CM | POA: Diagnosis not present

## 2022-09-15 DIAGNOSIS — M6281 Muscle weakness (generalized): Secondary | ICD-10-CM | POA: Diagnosis not present

## 2022-09-16 ENCOUNTER — Encounter: Payer: Self-pay | Admitting: Family Medicine

## 2022-09-16 ENCOUNTER — Non-Acute Institutional Stay: Payer: Self-pay | Admitting: Family Medicine

## 2022-09-16 VITALS — BP 140/50 | HR 66 | Temp 96.8°F | Resp 18 | Wt 162.8 lb

## 2022-09-16 DIAGNOSIS — R634 Abnormal weight loss: Secondary | ICD-10-CM

## 2022-09-16 DIAGNOSIS — Z515 Encounter for palliative care: Secondary | ICD-10-CM

## 2022-09-16 NOTE — Progress Notes (Signed)
[facility] Palliative Care Consult Note Telephone: [phone]  Fax: [phone]    Date of encounter: 09/16/22 8:57 PM PATIENT NAME: Dan [name] [place] [employer] [address]   [phone] (home)  DOB: 1946/06/21 MRN: [id number] PRIMARY CARE PROVIDER:    Azell Der, MD,  [address] [place] [address] [phone]  REFERRING PROVIDER:   Azell Der, MD [address] [place],  [place] [address] [phone]  RESPONSIBLE PARTY:    Contact Information     Name Relation Home Work Mobile   Gentry Spouse [phone]        I met face to face with patient in [facility] facility. Palliative Care was asked to follow this patient by consultation request of  Dsa, Mayford Knife, MD to address advance care planning and complex medical decision making. This is a follow up visit   ASSESSMENT , SYMPTOM MANAGEMENT AND PLAN / RECOMMENDATIONS:   Abnormal weight loss Weight loss of 24 lbs since June 2023.  Lost 4 lbs in past month  2.  Palliative Care Encounter Recommend simplification of medication regimen-d/c MVI/folic acid, atorvastatin as benefit is long term, not short term and would recommend Pepcid and d/c Omeprazole, d/c Vitamin D and ASA. [facility] POAs waiting to see degree of improvement and may want to proceed if weight loss continues with Hospice.   Palliative to follow currently.   Advance Care Planning/Goals of Care: Goals include to maximize quality of life and symptom management.  Exploration of personal, cultural or spiritual beliefs that might influence medical decisions -Previously expressed desire to simplify medication regimen unless bringing immediate comfort/benefit, avoid transfer to hospital, DNR/DNI, no IV fluids or feeding tubes. Need to complete MOST at next opportunity. Identification of healthcare agent-daughters Oretha Milch and Darlyne Russian Decision not  to resuscitate or to de-escalate disease focused treatments due to poor prognosis. CODE STATUS: DNR      Follow up Palliative Care Visit: Palliative care will continue to follow for complex medical decision making, advance care planning, and clarification of goals. Return 4 weeks or prn.    This visit was coded based on medical decision making (MDM).  PPS: 30%  HOSPICE ELIGIBILITY/DIAGNOSIS: TBD  Chief Complaint:  Palliative Care continues to follow patient for medical management in setting of severe vascular dementia.  HISTORY OF PRESENT ILLNESS:  Dan [name] is a 77 y.o. year old male with severe vascular dementia, Type 2 DM with diabetic nephropathy without long term insulin use, dysphagia, hx of GERD and stercoral ulcer, seborrheic dermatitis, BPH, HTN, hx of genital warts, HLD, depression with anxiety, abnormal weight loss and constipation. Pt seen sitting up in bed states he had breakfast but remains hungry and that he feels chilly.  Spoke with facility staff who states pt has been given extra portions with his meals and that he will often ask for more but doesn't typically eat it.  He thinks today is "republican day" and was advised it is Tuesday.  He states liking politics on TV, that he thinks Dan [name] is a "good guy" and that Trump is "a murderer".  Denies pain, sob, nausea, constipation.  Relates good appetite and states he will be moving soon to another room.  History obtained from review of EMR, discussion with primary team, and interview with facility staff and/or Mr. Dan [name].    I reviewed EMR for available labs, medications, imaging, studies and related documents.  There are no  new records since last visit.   ROS Limited due to advanced dementia General: NAD Cardiovascular: denies pain, denies DOE Pulmonary: denies cough, denies SOB Abdomen: endorses good appetite, denies constipation, wearing briefs MSK:  denies increased weakness,  no falls reported Skin: denies  rashes or wounds Neurological: denies pain   Physical Exam: Current and past weights: weight 09/15/22 162.8 lbs (162 lbs 12.8 oz), weight 02/21/22 was 186 lbs 6.4 oz (loss of 24 lbs) Constitutional: NAD General:  thin  ENMT: intact hearing, oral mucous membranes moist, dentition intact CV: S1S2, RRR, no LE edema Pulmonary: CTAB, no increased work of breathing, no cough, room air Abdomen: normo-active BS + 4 quadrants, soft and non tender GU: deferred MSK: no sarcopenia, moves all extremities, ambulatory,weaker on left side Skin: warm and dry, no rashes or wounds on visible skin Neuro:  left sided generalized weakness,  noted cognitive impairment. Much more conversant on current visit than prior. Psych: non-anxious affect, A and O x 1-2    Thank you for the opportunity to participate in the care of Mr. Dan [name].  The palliative care team will continue to follow. Please call our office at [phone] if we can be of additional assistance.   Marijo Conception, FNP -C  COVID-19 PATIENT SCREENING TOOL Asked and negative response unless otherwise noted:   Have you had symptoms of covid, tested positive or been in contact with someone with symptoms/positive test in the past 5-10 days?   Unknown

## 2022-09-17 DIAGNOSIS — E039 Hypothyroidism, unspecified: Secondary | ICD-10-CM | POA: Diagnosis not present

## 2022-09-17 DIAGNOSIS — E785 Hyperlipidemia, unspecified: Secondary | ICD-10-CM | POA: Diagnosis not present

## 2022-09-17 DIAGNOSIS — Z741 Need for assistance with personal care: Secondary | ICD-10-CM | POA: Diagnosis not present

## 2022-09-17 DIAGNOSIS — L219 Seborrheic dermatitis, unspecified: Secondary | ICD-10-CM | POA: Diagnosis not present

## 2022-09-17 DIAGNOSIS — G8929 Other chronic pain: Secondary | ICD-10-CM | POA: Diagnosis not present

## 2022-09-17 DIAGNOSIS — E559 Vitamin D deficiency, unspecified: Secondary | ICD-10-CM | POA: Diagnosis not present

## 2022-09-17 DIAGNOSIS — E119 Type 2 diabetes mellitus without complications: Secondary | ICD-10-CM | POA: Diagnosis not present

## 2022-09-17 DIAGNOSIS — K219 Gastro-esophageal reflux disease without esophagitis: Secondary | ICD-10-CM | POA: Diagnosis not present

## 2022-09-17 DIAGNOSIS — M6281 Muscle weakness (generalized): Secondary | ICD-10-CM | POA: Diagnosis not present

## 2022-09-17 DIAGNOSIS — I2510 Atherosclerotic heart disease of native coronary artery without angina pectoris: Secondary | ICD-10-CM | POA: Diagnosis not present

## 2022-09-17 DIAGNOSIS — R5381 Other malaise: Secondary | ICD-10-CM | POA: Diagnosis not present

## 2022-09-24 DIAGNOSIS — F01B2 Vascular dementia, moderate, with psychotic disturbance: Secondary | ICD-10-CM | POA: Diagnosis not present

## 2022-09-24 DIAGNOSIS — F3173 Bipolar disorder, in partial remission, most recent episode manic: Secondary | ICD-10-CM | POA: Diagnosis not present

## 2022-09-24 DIAGNOSIS — G8929 Other chronic pain: Secondary | ICD-10-CM | POA: Diagnosis not present

## 2022-09-25 DIAGNOSIS — R072 Precordial pain: Secondary | ICD-10-CM | POA: Diagnosis not present

## 2022-09-25 DIAGNOSIS — Z955 Presence of coronary angioplasty implant and graft: Secondary | ICD-10-CM | POA: Diagnosis not present

## 2022-09-25 DIAGNOSIS — E785 Hyperlipidemia, unspecified: Secondary | ICD-10-CM | POA: Diagnosis not present

## 2022-09-25 DIAGNOSIS — E1169 Type 2 diabetes mellitus with other specified complication: Secondary | ICD-10-CM | POA: Diagnosis not present

## 2022-09-25 DIAGNOSIS — I10 Essential (primary) hypertension: Secondary | ICD-10-CM | POA: Diagnosis not present

## 2022-09-25 DIAGNOSIS — I2510 Atherosclerotic heart disease of native coronary artery without angina pectoris: Secondary | ICD-10-CM | POA: Diagnosis not present

## 2022-09-29 DIAGNOSIS — K5289 Other specified noninfective gastroenteritis and colitis: Secondary | ICD-10-CM | POA: Diagnosis not present

## 2022-09-29 DIAGNOSIS — I10 Essential (primary) hypertension: Secondary | ICD-10-CM | POA: Diagnosis not present

## 2022-09-29 DIAGNOSIS — F03918 Unspecified dementia, unspecified severity, with other behavioral disturbance: Secondary | ICD-10-CM | POA: Diagnosis not present

## 2022-09-29 DIAGNOSIS — E039 Hypothyroidism, unspecified: Secondary | ICD-10-CM | POA: Diagnosis not present

## 2022-09-29 DIAGNOSIS — R5381 Other malaise: Secondary | ICD-10-CM | POA: Diagnosis not present

## 2022-09-29 DIAGNOSIS — E876 Hypokalemia: Secondary | ICD-10-CM | POA: Diagnosis not present

## 2022-09-29 DIAGNOSIS — K219 Gastro-esophageal reflux disease without esophagitis: Secondary | ICD-10-CM | POA: Diagnosis not present

## 2022-10-02 DIAGNOSIS — E785 Hyperlipidemia, unspecified: Secondary | ICD-10-CM | POA: Diagnosis not present

## 2022-10-02 DIAGNOSIS — K219 Gastro-esophageal reflux disease without esophagitis: Secondary | ICD-10-CM | POA: Diagnosis not present

## 2022-10-02 DIAGNOSIS — E559 Vitamin D deficiency, unspecified: Secondary | ICD-10-CM | POA: Diagnosis not present

## 2022-10-02 DIAGNOSIS — E876 Hypokalemia: Secondary | ICD-10-CM | POA: Diagnosis not present

## 2022-10-02 DIAGNOSIS — E119 Type 2 diabetes mellitus without complications: Secondary | ICD-10-CM | POA: Diagnosis not present

## 2022-10-02 DIAGNOSIS — E039 Hypothyroidism, unspecified: Secondary | ICD-10-CM | POA: Diagnosis not present

## 2022-10-02 DIAGNOSIS — I2510 Atherosclerotic heart disease of native coronary artery without angina pectoris: Secondary | ICD-10-CM | POA: Diagnosis not present

## 2022-10-02 DIAGNOSIS — G8929 Other chronic pain: Secondary | ICD-10-CM | POA: Diagnosis not present

## 2022-10-02 DIAGNOSIS — F419 Anxiety disorder, unspecified: Secondary | ICD-10-CM | POA: Diagnosis not present

## 2022-10-06 DIAGNOSIS — R5381 Other malaise: Secondary | ICD-10-CM | POA: Diagnosis not present

## 2022-10-06 DIAGNOSIS — I10 Essential (primary) hypertension: Secondary | ICD-10-CM | POA: Diagnosis not present

## 2022-10-06 DIAGNOSIS — E785 Hyperlipidemia, unspecified: Secondary | ICD-10-CM | POA: Diagnosis not present

## 2022-10-06 DIAGNOSIS — G8929 Other chronic pain: Secondary | ICD-10-CM | POA: Diagnosis not present

## 2022-10-06 DIAGNOSIS — E876 Hypokalemia: Secondary | ICD-10-CM | POA: Diagnosis not present

## 2022-10-06 DIAGNOSIS — K219 Gastro-esophageal reflux disease without esophagitis: Secondary | ICD-10-CM | POA: Diagnosis not present

## 2022-10-06 DIAGNOSIS — E039 Hypothyroidism, unspecified: Secondary | ICD-10-CM | POA: Diagnosis not present

## 2022-10-06 DIAGNOSIS — E559 Vitamin D deficiency, unspecified: Secondary | ICD-10-CM | POA: Diagnosis not present

## 2022-10-06 DIAGNOSIS — K5289 Other specified noninfective gastroenteritis and colitis: Secondary | ICD-10-CM | POA: Diagnosis not present

## 2023-08-09 DEATH — deceased
